# Patient Record
Sex: Female | Born: 1960
Health system: Southern US, Community
[De-identification: ages and names within clinical notes are randomized; demographics above are authoritative.]

## PROBLEM LIST (undated history)

## (undated) DIAGNOSIS — E119 Type 2 diabetes mellitus without complications: Secondary | ICD-10-CM

## (undated) DIAGNOSIS — C801 Malignant (primary) neoplasm, unspecified: Secondary | ICD-10-CM

## (undated) DIAGNOSIS — M797 Fibromyalgia: Secondary | ICD-10-CM

## (undated) DIAGNOSIS — K760 Fatty (change of) liver, not elsewhere classified: Secondary | ICD-10-CM

## (undated) DIAGNOSIS — E785 Hyperlipidemia, unspecified: Secondary | ICD-10-CM

## (undated) DIAGNOSIS — F32A Depression, unspecified: Secondary | ICD-10-CM

## (undated) DIAGNOSIS — I10 Essential (primary) hypertension: Secondary | ICD-10-CM

## (undated) DIAGNOSIS — F329 Major depressive disorder, single episode, unspecified: Secondary | ICD-10-CM

## (undated) HISTORY — DX: Fatty (change of) liver, not elsewhere classified: K76.0

## (undated) HISTORY — DX: Essential (primary) hypertension: I10

## (undated) HISTORY — PX: ABDOMINAL HYSTERECTOMY: SHX81

## (undated) HISTORY — DX: Hyperlipidemia, unspecified: E78.5

## (undated) HISTORY — DX: Major depressive disorder, single episode, unspecified: F32.9

## (undated) HISTORY — DX: Type 2 diabetes mellitus without complications: E11.9

## (undated) HISTORY — PX: RADICAL ABDOMINAL HYSTERECTOMY: SUR659

## (undated) HISTORY — DX: Malignant (primary) neoplasm, unspecified: C80.1

## (undated) HISTORY — DX: Fibromyalgia: M79.7

## (undated) HISTORY — DX: Depression, unspecified: F32.A

---

## 2004-06-23 DIAGNOSIS — C801 Malignant (primary) neoplasm, unspecified: Secondary | ICD-10-CM

## 2004-06-23 HISTORY — DX: Malignant (primary) neoplasm, unspecified: C80.1

## 2014-06-02 DIAGNOSIS — R809 Proteinuria, unspecified: Secondary | ICD-10-CM

## 2014-06-02 DIAGNOSIS — E119 Type 2 diabetes mellitus without complications: Secondary | ICD-10-CM | POA: Insufficient documentation

## 2014-06-02 DIAGNOSIS — E1169 Type 2 diabetes mellitus with other specified complication: Secondary | ICD-10-CM | POA: Insufficient documentation

## 2014-06-02 DIAGNOSIS — F329 Major depressive disorder, single episode, unspecified: Secondary | ICD-10-CM | POA: Insufficient documentation

## 2014-06-02 DIAGNOSIS — E785 Hyperlipidemia, unspecified: Secondary | ICD-10-CM

## 2014-06-02 DIAGNOSIS — E1129 Type 2 diabetes mellitus with other diabetic kidney complication: Secondary | ICD-10-CM

## 2014-06-02 DIAGNOSIS — F32A Depression, unspecified: Secondary | ICD-10-CM | POA: Insufficient documentation

## 2014-06-02 DIAGNOSIS — I10 Essential (primary) hypertension: Secondary | ICD-10-CM | POA: Insufficient documentation

## 2014-09-22 HISTORY — PX: COLONOSCOPY: SHX174

## 2016-11-07 ENCOUNTER — Encounter: Payer: Self-pay | Admitting: Family Medicine

## 2016-11-07 ENCOUNTER — Ambulatory Visit (INDEPENDENT_AMBULATORY_CARE_PROVIDER_SITE_OTHER): Payer: BLUE CROSS/BLUE SHIELD | Admitting: Family Medicine

## 2016-11-07 VITALS — BP 138/85 | HR 83 | Temp 97.6°F | Ht 64.0 in | Wt 164.8 lb

## 2016-11-07 DIAGNOSIS — E1169 Type 2 diabetes mellitus with other specified complication: Secondary | ICD-10-CM | POA: Diagnosis not present

## 2016-11-07 DIAGNOSIS — Z1231 Encounter for screening mammogram for malignant neoplasm of breast: Secondary | ICD-10-CM | POA: Diagnosis not present

## 2016-11-07 DIAGNOSIS — E785 Hyperlipidemia, unspecified: Secondary | ICD-10-CM | POA: Diagnosis not present

## 2016-11-07 DIAGNOSIS — R809 Proteinuria, unspecified: Secondary | ICD-10-CM | POA: Diagnosis not present

## 2016-11-07 DIAGNOSIS — I10 Essential (primary) hypertension: Secondary | ICD-10-CM

## 2016-11-07 DIAGNOSIS — E1129 Type 2 diabetes mellitus with other diabetic kidney complication: Secondary | ICD-10-CM

## 2016-11-07 DIAGNOSIS — M797 Fibromyalgia: Secondary | ICD-10-CM

## 2016-11-07 DIAGNOSIS — Z1239 Encounter for other screening for malignant neoplasm of breast: Secondary | ICD-10-CM

## 2016-11-07 LAB — BAYER DCA HB A1C WAIVED: HB A1C: 6.6 % (ref <7.0)

## 2016-11-07 MED ORDER — MELOXICAM 7.5 MG PO TABS: 7.5000 mg | ORAL_TABLET | Freq: Every day | ORAL | 5 refills | Status: DC

## 2016-11-07 MED ORDER — PROPRANOLOL HCL 20 MG PO TABS: 20.0000 mg | ORAL_TABLET | Freq: Two times a day (BID) | ORAL | 11 refills | Status: DC

## 2016-11-07 NOTE — Progress Notes (Signed)
   HPI  Patient presents today here to establish care.  Pt has Medical Hx of hypertension, type 2 diabetes, fibromyalgia, hyperlipidemia She has passed history of partial hysterectomy for cervical cancer.  Patient needs refill of meloxicam today. Previously taking 7.5 mg twice daily for 5 around with very good improvement. Has been out of it for a few weeks  Diabetes Average fasting 100-130. Average postprandial 90-110 Good medication compliance, no yeast infections with farxiga  PMH: see above Past surgical history: Partial hysterectomy Social history- nonsmoker, no alcohol use, no drug use History: Mother with dementia, depression, heart disease, stroke Father with diabetes, hyperlipidemia, hypertension, stroke  ROS: Per HPI  Objective: BP 138/85   Pulse 83   Temp 97.6 F (36.4 C) (Oral)   Ht '5\' 4"'$  (1.626 m)   Wt 164 lb 12.8 oz (74.8 kg)   BMI 28.29 kg/m  Gen: NAD, alert, cooperative with exam HEENT: NCAT CV: RRR, good S1/S2, no murmur Resp: CTABL, no wheezes, non-labored Abd: SNTND, BS present, no guarding or organomegaly Ext: No edema, warm Neuro: Alert and oriented, No gross deficits  Assessment and plan:  # Type 2 diabetes A1c pending, expect reasonable diabetic control Continue current medications, metformin, Januvia, and farxiga  # HTN Controlled, refill propranolol  # Fibromyalgia Reduce dose of meloxicam to 7.5 mg daily  # HLD Non fasting, LDL ordered No meds for now   Breast cancer screening mammo ordered  Orders Placed This Encounter  Procedures  . MM Digital Screening    Standing Status:   Future    Standing Expiration Date:   01/07/2018    Scheduling Instructions:     [facility]    Order Specific Question:   Reason for Exam (SYMPTOM  OR DIAGNOSIS REQUIRED)    Answer:   screening    Order Specific Question:   Is the patient pregnant?    Answer:   No    Order Specific Question:   Preferred imaging location?    Answer:    External  . LDL Cholesterol, Direct  . CBC with Differential/Platelet  . CMP14+EGFR  . TSH  . Bayer DCA Hb A1c Waived    Meds ordered this encounter  Medications  . DISCONTD: propranolol (INDERAL) 20 MG tablet    Sig: Take 20 mg by mouth 2 (two) times daily.  . sertraline (ZOLOFT) 50 MG tablet    Sig: Take 50 mg by mouth daily.  . Dapagliflozin-Metformin HCl ER 10-998 MG TB24    Sig: Take 2 tablets by mouth every morning.  . sitaGLIPtin (JANUVIA) 100 MG tablet    Sig: Take 1 tablet by mouth daily.  Marland Kitchen DISCONTD: meloxicam (MOBIC) 7.5 MG tablet    Sig: Take 1 tablet by mouth 2 (two) times daily.  . propranolol (INDERAL) 20 MG tablet    Sig: Take 1 tablet (20 mg total) by mouth 2 (two) times daily.    Dispense:  60 tablet    Refill:  11  . meloxicam (MOBIC) 7.5 MG tablet    Sig: Take 1 tablet (7.5 mg total) by mouth daily.    Dispense:  30 tablet    Refill:  [place], MD [place] 11/07/2016, 1:48 PM

## 2016-11-07 NOTE — Patient Instructions (Signed)
Great to meet you!  Come back in 3 months unless you need [place] sooner.   Consider scheduling your mammogram  We will send your labs on mychart or call within 1 week

## 2016-11-08 LAB — CMP14+EGFR
ALBUMIN: 4.3 g/dL (ref 3.5–5.5)
ALT: 38 IU/L — AB (ref 0–32)
AST: 21 IU/L (ref 0–40)
Albumin/Globulin Ratio: 1.9 (ref 1.2–2.2)
Alkaline Phosphatase: 95 IU/L (ref 39–117)
BUN / CREAT RATIO: 18 (ref 9–23)
BUN: 12 mg/dL (ref 6–24)
CALCIUM: 9.4 mg/dL (ref 8.7–10.2)
CHLORIDE: 99 mmol/L (ref 96–106)
CO2: 22 mmol/L (ref 18–29)
Creatinine, Ser: 0.66 mg/dL (ref 0.57–1.00)
GFR calc Af Amer: 116 mL/min/{1.73_m2} (ref >59)
GFR, EST NON AFRICAN AMERICAN: 100 mL/min/{1.73_m2} (ref >59)
GLUCOSE: 116 mg/dL — AB (ref 65–99)
Globulin, Total: 2.3 g/dL (ref 1.5–4.5)
Potassium: 4.4 mmol/L (ref 3.5–5.2)
Sodium: 142 mmol/L (ref 134–144)
TOTAL PROTEIN: 6.6 g/dL (ref 6.0–8.5)

## 2016-11-08 LAB — CBC WITH DIFFERENTIAL/PLATELET
BASOS: 1 %
Basophils Absolute: 0.1 10*3/uL (ref 0.0–0.2)
EOS (ABSOLUTE): 0.1 10*3/uL (ref 0.0–0.4)
Eos: 1 %
HEMOGLOBIN: 13.9 g/dL (ref 11.1–15.9)
Hematocrit: 41 % (ref 34.0–46.6)
IMMATURE GRANS (ABS): 0.1 10*3/uL (ref 0.0–0.1)
IMMATURE GRANULOCYTES: 1 %
Lymphocytes Absolute: 1.7 10*3/uL (ref 0.7–3.1)
Lymphs: 16 %
MCH: 29.7 pg (ref 26.6–33.0)
MCHC: 33.9 g/dL (ref 31.5–35.7)
MCV: 88 fL (ref 79–97)
MONOCYTES: 6 %
Monocytes Absolute: 0.7 10*3/uL (ref 0.1–0.9)
NEUTROS PCT: 75 %
Neutrophils Absolute: 8.2 10*3/uL — ABNORMAL HIGH (ref 1.4–7.0)
Platelets: 287 10*3/uL (ref 150–379)
RBC: 4.68 x10E6/uL (ref 3.77–5.28)
RDW: 13.7 % (ref 12.3–15.4)
WBC: 10.8 10*3/uL (ref 3.4–10.8)

## 2016-11-08 LAB — TSH: TSH: 1.46 u[IU]/mL (ref 0.450–4.500)

## 2016-11-08 LAB — LDL CHOLESTEROL, DIRECT: LDL Direct: 122 mg/dL — ABNORMAL HIGH (ref 0–99)

## 2016-11-19 ENCOUNTER — Telehealth: Payer: Self-pay | Admitting: Family Medicine

## 2016-12-04 ENCOUNTER — Other Ambulatory Visit: Payer: Self-pay

## 2016-12-04 MED ORDER — SERTRALINE HCL 50 MG PO TABS: 50.0000 mg | ORAL_TABLET | Freq: Every day | ORAL | 3 refills | Status: DC

## 2016-12-04 NOTE — Telephone Encounter (Signed)
Has never been filled here.

## 2016-12-12 ENCOUNTER — Telehealth: Payer: Self-pay | Admitting: Family Medicine

## 2016-12-15 ENCOUNTER — Other Ambulatory Visit: Payer: Self-pay | Admitting: Family Medicine

## 2016-12-15 MED ORDER — DAPAGLIFLOZIN PRO-METFORMIN ER 5-1000 MG PO TB24: 2.0000 | ORAL_TABLET | Freq: Every morning | ORAL | 2 refills | Status: DC

## 2016-12-15 MED ORDER — SITAGLIPTIN PHOSPHATE 100 MG PO TABS: 100.0000 mg | ORAL_TABLET | Freq: Every day | ORAL | 2 refills | Status: DC

## 2016-12-15 NOTE — Telephone Encounter (Signed)
Refill sent.

## 2016-12-31 ENCOUNTER — Encounter: Payer: Self-pay | Admitting: Family Medicine

## 2017-02-07 ENCOUNTER — Other Ambulatory Visit: Payer: Self-pay | Admitting: Family Medicine

## 2017-04-07 ENCOUNTER — Other Ambulatory Visit: Payer: Self-pay | Admitting: Family Medicine

## 2017-04-24 ENCOUNTER — Ambulatory Visit (INDEPENDENT_AMBULATORY_CARE_PROVIDER_SITE_OTHER): Payer: BLUE CROSS/BLUE SHIELD | Admitting: Family Medicine

## 2017-04-24 ENCOUNTER — Encounter: Payer: Self-pay | Admitting: Family Medicine

## 2017-04-24 VITALS — BP 139/81 | HR 79 | Temp 97.6°F | Ht 64.0 in | Wt 166.8 lb

## 2017-04-24 DIAGNOSIS — F419 Anxiety disorder, unspecified: Secondary | ICD-10-CM

## 2017-04-24 DIAGNOSIS — R400 Somnolence: Secondary | ICD-10-CM

## 2017-04-24 DIAGNOSIS — E1129 Type 2 diabetes mellitus with other diabetic kidney complication: Secondary | ICD-10-CM

## 2017-04-24 DIAGNOSIS — R809 Proteinuria, unspecified: Secondary | ICD-10-CM | POA: Diagnosis not present

## 2017-04-24 LAB — BAYER DCA HB A1C WAIVED: HB A1C: 8.7 % — AB (ref <7.0)

## 2017-04-24 MED ORDER — QUETIAPINE FUMARATE 50 MG PO TABS: 50.0000 mg | ORAL_TABLET | Freq: Every day | ORAL | 3 refills | Status: DC

## 2017-04-24 NOTE — Progress Notes (Signed)
   HPI  Patient presents today here for follow-up of chronic medical conditions as well as daytime sleepiness  She states this problems been going on for about 5 years.  She reports difficulty sleeping at night with multiple awakenings and racing thoughts. She has been on Zoloft for more than 5 years for depression with good benefit.  She has a sister with bipolar disorder, however denies any other symptoms of bipolar disorder except for racing thoughts.  She does snore very loudly.  She has multiple episodes of falling asleep very lightly during the day with quickly waking up.  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   PMH: Smoking status noted ROS: Per HPI  Objective: BP 139/81   Pulse 79   Temp 97.6 F (36.4 C) (Oral)   Ht 5\' 4"  (1.626 m)   Wt 166 lb 12.8 oz (75.7 kg)   BMI 28.63 kg/m  Gen: NAD, alert, cooperative with exam HEENT: NCAT CV: RRR, good S1/S2, no murmur Resp: CTABL, no wheezes, non-labored Ext: No edema, warm Neuro: Alert and oriented, No gross deficits  Depression screen [facility] 2/9 04/24/2017 11/07/2016  Decreased Interest 2 0  Down, Depressed, Hopeless 0 0  PHQ - 2 Score 2 0  Altered sleeping 3 -  Tired, decreased energy 3 -  Change in appetite 2 -  Feeling bad or failure about yourself  0 -  Trouble concentrating 0 -  Moving slowly or fidgety/restless 0 -  Suicidal thoughts 0 -  PHQ-9 Score 10 -     Assessment and plan:  #Daytime sleepiness, anxiety Likely multifactorial daytime sleepiness, anxiety possibly contributing  Patient certainly does have some racing thoughts at night that are contributing to her difficulty sleeping, have started Seroquel for this.  With loud snoring and multiple episodes of falling asleep during the daytime I am also concerned about obstructive sleep apnea or other sleep disturbance disorder.  I have referred her to neurology to discuss this  more thoroughly.   # type 2 diabetes Good medication compliance, clinically stable Fasting blood sugar slightly uncontrolled A1c pending.      Orders Placed This Encounter  Procedures  . Bayer DCA Hb A1c Waived  . Ambulatory referral to Neurology    Referral Priority:   Routine    Referral Type:   Consultation    Referral Reason:   Specialty Services Required    Requested Specialty:   Neurology    Number of Visits Requested:   1    Meds ordered this encounter  Medications  . QUEtiapine (SEROQUEL) 50 MG tablet    Sig: Take 1 tablet (50 mg total) by mouth at bedtime.    Dispense:  30 tablet    Refill:  [place], MD [place] 04/24/2017, 10:06 AM

## 2017-04-24 NOTE — Patient Instructions (Addendum)
Great to see you!  Start seroquel at night 1 pill once daily.   Come back in about 1 month

## 2017-04-29 DIAGNOSIS — Z1231 Encounter for screening mammogram for malignant neoplasm of breast: Secondary | ICD-10-CM | POA: Diagnosis not present

## 2017-05-14 ENCOUNTER — Other Ambulatory Visit: Payer: Self-pay | Admitting: Family Medicine

## 2017-05-28 ENCOUNTER — Other Ambulatory Visit: Payer: Self-pay | Admitting: Family Medicine

## 2017-05-29 DIAGNOSIS — M65332 Trigger finger, left middle finger: Secondary | ICD-10-CM | POA: Diagnosis not present

## 2017-05-29 NOTE — Telephone Encounter (Signed)
Will you please look at rx. It states to take 2 daily but for quanity of 30.

## 2017-07-23 ENCOUNTER — Other Ambulatory Visit: Payer: Self-pay | Admitting: Family Medicine

## 2017-07-30 DIAGNOSIS — J029 Acute pharyngitis, unspecified: Secondary | ICD-10-CM | POA: Diagnosis not present

## 2017-07-30 DIAGNOSIS — J0100 Acute maxillary sinusitis, unspecified: Secondary | ICD-10-CM | POA: Diagnosis not present

## 2017-08-23 ENCOUNTER — Other Ambulatory Visit: Payer: Self-pay | Admitting: Family Medicine

## 2017-09-07 ENCOUNTER — Other Ambulatory Visit: Payer: Self-pay | Admitting: Family Medicine

## 2017-09-25 ENCOUNTER — Encounter: Payer: Self-pay | Admitting: Family Medicine

## 2017-09-25 ENCOUNTER — Ambulatory Visit: Payer: BLUE CROSS/BLUE SHIELD | Admitting: Family Medicine

## 2017-09-25 VITALS — BP 135/76 | HR 74 | Temp 97.7°F | Ht 64.0 in | Wt 164.8 lb

## 2017-09-25 DIAGNOSIS — E785 Hyperlipidemia, unspecified: Secondary | ICD-10-CM

## 2017-09-25 DIAGNOSIS — F419 Anxiety disorder, unspecified: Secondary | ICD-10-CM

## 2017-09-25 DIAGNOSIS — E1129 Type 2 diabetes mellitus with other diabetic kidney complication: Secondary | ICD-10-CM | POA: Diagnosis not present

## 2017-09-25 DIAGNOSIS — I10 Essential (primary) hypertension: Secondary | ICD-10-CM

## 2017-09-25 DIAGNOSIS — E1169 Type 2 diabetes mellitus with other specified complication: Secondary | ICD-10-CM | POA: Diagnosis not present

## 2017-09-25 DIAGNOSIS — R809 Proteinuria, unspecified: Secondary | ICD-10-CM | POA: Diagnosis not present

## 2017-09-25 LAB — BAYER DCA HB A1C WAIVED: HB A1C (BAYER DCA - WAIVED): 8.1 % — ABNORMAL HIGH (ref <7.0)

## 2017-09-25 NOTE — Progress Notes (Signed)
   HPI  Patient presents today here for follow-up chronic medical conditions.  Seroquel is helping sleep considerably, she is very satisfied.  Hypertension Good medication compliance with Inderal No chest pain.  Diabetes Not checking blood sugars consistently, fasting blood sugar is 182 today, she is fasting currently.  She is tolerating Januvia and xigduo well She is watching her diet.  PMH: Smoking status noted ROS: Per HPI  Objective: BP 135/76   Pulse 74   Temp 97.7 F (36.5 C) (Oral)   Ht '5\' 4"'$  (1.626 m)   Wt 164 lb 12.8 oz (74.8 kg)   BMI 28.29 kg/m  Gen: NAD, alert, cooperative with exam HEENT: NCAT CV: RRR, good S1/S2, no murmur Resp: CTABL, no wheezes, non-labored Ext: No edema, warm Neuro: Alert and oriented, No gross deficits  Diabetic Foot Exam - Simple   Simple Foot Form Diabetic Foot exam was performed with the following findings:  Yes 09/25/2017 10:06 AM  Visual Inspection No deformities, no ulcerations, no other skin breakdown bilaterally:  Yes Sensation Testing Intact to touch and monofilament testing bilaterally:  Yes Pulse Check Posterior Tibialis and Dorsalis pulse intact bilaterally:  Yes Comments      Assessment and plan:  #Type 2 diabetes I expect good control, patient feels that previous slip was due to dietary indiscretion which she has corrected. Continue current regimen, change Januvia to Trulicity if still uncontrolled   #Anxiety Doing well with Zoloft plus Seroquel, no changes   #Hypertension Patient only treated with propranolol, no changes Well-controlled  Hyperlipidemia Repeat labs, fasting   Orders Placed This Encounter  Procedures  . Bayer DCA Hb A1c Waived  . Lipid panel  . CBC with Differential/Platelet  . CMP14+EGFR  . TSH   Laroy Apple, MD [facility] 09/25/2017, 10:07 AM

## 2017-09-25 NOTE — Patient Instructions (Signed)
Great to see you!  Come back in 3 months to see [facility]

## 2017-09-26 LAB — CBC WITH DIFFERENTIAL/PLATELET
BASOS: 0 %
Basophils Absolute: 0 10*3/uL (ref 0.0–0.2)
EOS (ABSOLUTE): 0.1 10*3/uL (ref 0.0–0.4)
Eos: 1 %
Hematocrit: 43.2 % (ref 34.0–46.6)
Hemoglobin: 14 g/dL (ref 11.1–15.9)
IMMATURE GRANS (ABS): 0.1 10*3/uL (ref 0.0–0.1)
IMMATURE GRANULOCYTES: 1 %
LYMPHS: 20 %
Lymphocytes Absolute: 1.6 10*3/uL (ref 0.7–3.1)
MCH: 29.4 pg (ref 26.6–33.0)
MCHC: 32.4 g/dL (ref 31.5–35.7)
MCV: 91 fL (ref 79–97)
Monocytes Absolute: 0.6 10*3/uL (ref 0.1–0.9)
Monocytes: 8 %
NEUTROS PCT: 70 %
Neutrophils Absolute: 5.6 10*3/uL (ref 1.4–7.0)
PLATELETS: 320 10*3/uL (ref 150–379)
RBC: 4.76 x10E6/uL (ref 3.77–5.28)
RDW: 13.3 % (ref 12.3–15.4)
WBC: 8.1 10*3/uL (ref 3.4–10.8)

## 2017-09-26 LAB — LIPID PANEL
CHOLESTEROL TOTAL: 222 mg/dL — AB (ref 100–199)
Chol/HDL Ratio: 5.6 ratio — ABNORMAL HIGH (ref 0.0–4.4)
HDL: 40 mg/dL (ref >39)
LDL CALC: 124 mg/dL — AB (ref 0–99)
Triglycerides: 290 mg/dL — ABNORMAL HIGH (ref 0–149)
VLDL Cholesterol Cal: 58 mg/dL — ABNORMAL HIGH (ref 5–40)

## 2017-09-26 LAB — CMP14+EGFR
A/G RATIO: 2 (ref 1.2–2.2)
ALT: 70 IU/L — AB (ref 0–32)
AST: 36 IU/L (ref 0–40)
Albumin: 4.6 g/dL (ref 3.5–5.5)
Alkaline Phosphatase: 89 IU/L (ref 39–117)
BUN/Creatinine Ratio: 18 (ref 9–23)
BUN: 12 mg/dL (ref 6–24)
Bilirubin Total: 0.3 mg/dL (ref 0.0–1.2)
CALCIUM: 9.9 mg/dL (ref 8.7–10.2)
CO2: 23 mmol/L (ref 20–29)
CREATININE: 0.65 mg/dL (ref 0.57–1.00)
Chloride: 101 mmol/L (ref 96–106)
GFR, EST AFRICAN AMERICAN: 116 mL/min/{1.73_m2} (ref >59)
GFR, EST NON AFRICAN AMERICAN: 100 mL/min/{1.73_m2} (ref >59)
GLUCOSE: 175 mg/dL — AB (ref 65–99)
Globulin, Total: 2.3 g/dL (ref 1.5–4.5)
Potassium: 4.9 mmol/L (ref 3.5–5.2)
Sodium: 142 mmol/L (ref 134–144)
TOTAL PROTEIN: 6.9 g/dL (ref 6.0–8.5)

## 2017-09-26 LAB — TSH: TSH: 1.41 u[IU]/mL (ref 0.450–4.500)

## 2017-09-29 ENCOUNTER — Encounter: Payer: Self-pay | Admitting: *Deleted

## 2017-09-30 ENCOUNTER — Telehealth: Payer: Self-pay | Admitting: Physician Assistant

## 2017-09-30 NOTE — Telephone Encounter (Signed)
Patient aware of results.

## 2017-10-01 ENCOUNTER — Other Ambulatory Visit: Payer: Self-pay | Admitting: Family Medicine

## 2017-10-01 MED ORDER — DULAGLUTIDE 0.75 MG/0.5ML ~~LOC~~ SOAJ: 0.7500 mg | SUBCUTANEOUS | 1 refills | Status: DC

## 2017-10-01 MED ORDER — ATORVASTATIN CALCIUM 10 MG PO TABS: 10.0000 mg | ORAL_TABLET | Freq: Every day | ORAL | 3 refills | Status: DC

## 2017-11-10 ENCOUNTER — Telehealth: Payer: Self-pay | Admitting: Physician Assistant

## 2017-11-20 ENCOUNTER — Other Ambulatory Visit: Payer: Self-pay | Admitting: Family Medicine

## 2017-11-22 ENCOUNTER — Other Ambulatory Visit: Payer: Self-pay | Admitting: Family Medicine

## 2017-11-24 DIAGNOSIS — M545 Low back pain: Secondary | ICD-10-CM | POA: Diagnosis not present

## 2017-11-24 DIAGNOSIS — M25529 Pain in unspecified elbow: Secondary | ICD-10-CM | POA: Diagnosis not present

## 2017-12-10 ENCOUNTER — Other Ambulatory Visit: Payer: Self-pay | Admitting: Family Medicine

## 2017-12-14 DIAGNOSIS — M659 Synovitis and tenosynovitis, unspecified: Secondary | ICD-10-CM | POA: Diagnosis not present

## 2017-12-14 DIAGNOSIS — M79644 Pain in right finger(s): Secondary | ICD-10-CM | POA: Diagnosis not present

## 2017-12-16 ENCOUNTER — Other Ambulatory Visit: Payer: Self-pay | Admitting: Family Medicine

## 2017-12-18 ENCOUNTER — Other Ambulatory Visit: Payer: Self-pay | Admitting: Family Medicine

## 2017-12-28 ENCOUNTER — Ambulatory Visit: Payer: BLUE CROSS/BLUE SHIELD | Admitting: Physician Assistant

## 2017-12-28 ENCOUNTER — Encounter: Payer: Self-pay | Admitting: Physician Assistant

## 2017-12-28 VITALS — BP 124/79 | HR 87 | Temp 98.3°F | Ht 64.0 in | Wt 162.6 lb

## 2017-12-28 DIAGNOSIS — E1129 Type 2 diabetes mellitus with other diabetic kidney complication: Secondary | ICD-10-CM

## 2017-12-28 DIAGNOSIS — R809 Proteinuria, unspecified: Secondary | ICD-10-CM

## 2017-12-28 DIAGNOSIS — M79644 Pain in right finger(s): Secondary | ICD-10-CM | POA: Diagnosis not present

## 2017-12-28 DIAGNOSIS — I10 Essential (primary) hypertension: Secondary | ICD-10-CM

## 2017-12-28 DIAGNOSIS — M2550 Pain in unspecified joint: Secondary | ICD-10-CM | POA: Diagnosis not present

## 2017-12-28 LAB — BAYER DCA HB A1C WAIVED: HB A1C (BAYER DCA - WAIVED): 7.8 % — ABNORMAL HIGH (ref <7.0)

## 2017-12-28 MED ORDER — MELOXICAM 7.5 MG PO TABS: 7.5000 mg | ORAL_TABLET | Freq: Every day | ORAL | 3 refills | Status: DC

## 2017-12-28 MED ORDER — SERTRALINE HCL 50 MG PO TABS: 50.0000 mg | ORAL_TABLET | Freq: Every day | ORAL | 3 refills | Status: DC

## 2017-12-28 MED ORDER — DULAGLUTIDE 0.75 MG/0.5ML ~~LOC~~ SOAJ: SUBCUTANEOUS | 3 refills | Status: DC

## 2017-12-28 MED ORDER — PROPRANOLOL HCL 20 MG PO TABS: 20.0000 mg | ORAL_TABLET | Freq: Two times a day (BID) | ORAL | 3 refills | Status: DC

## 2017-12-28 MED ORDER — DAPAGLIFLOZIN PRO-METFORMIN ER 5-1000 MG PO TB24: 2.0000 | ORAL_TABLET | ORAL | 3 refills | Status: DC

## 2017-12-29 ENCOUNTER — Telehealth: Payer: Self-pay | Admitting: Physician Assistant

## 2017-12-29 LAB — CMP14+EGFR
A/G RATIO: 1.7 (ref 1.2–2.2)
ALK PHOS: 89 IU/L (ref 39–117)
ALT: 50 IU/L — AB (ref 0–32)
AST: 23 IU/L (ref 0–40)
Albumin: 4.2 g/dL (ref 3.5–5.5)
BILIRUBIN TOTAL: 0.2 mg/dL (ref 0.0–1.2)
BUN/Creatinine Ratio: 16 (ref 9–23)
BUN: 10 mg/dL (ref 6–24)
CALCIUM: 9.6 mg/dL (ref 8.7–10.2)
CHLORIDE: 103 mmol/L (ref 96–106)
CO2: 22 mmol/L (ref 20–29)
Creatinine, Ser: 0.61 mg/dL (ref 0.57–1.00)
GFR calc Af Amer: 118 mL/min/{1.73_m2} (ref >59)
GFR calc non Af Amer: 102 mL/min/{1.73_m2} (ref >59)
GLOBULIN, TOTAL: 2.5 g/dL (ref 1.5–4.5)
Glucose: 173 mg/dL — ABNORMAL HIGH (ref 65–99)
POTASSIUM: 4.5 mmol/L (ref 3.5–5.2)
SODIUM: 144 mmol/L (ref 134–144)
Total Protein: 6.7 g/dL (ref 6.0–8.5)

## 2017-12-29 LAB — CBC WITH DIFFERENTIAL/PLATELET
BASOS ABS: 0 10*3/uL (ref 0.0–0.2)
BASOS: 0 %
EOS (ABSOLUTE): 0.1 10*3/uL (ref 0.0–0.4)
Eos: 1 %
Hematocrit: 39.6 % (ref 34.0–46.6)
Hemoglobin: 13.7 g/dL (ref 11.1–15.9)
Immature Grans (Abs): 0.1 10*3/uL (ref 0.0–0.1)
Immature Granulocytes: 1 %
LYMPHS: 26 %
Lymphocytes Absolute: 2.7 10*3/uL (ref 0.7–3.1)
MCH: 30.5 pg (ref 26.6–33.0)
MCHC: 34.6 g/dL (ref 31.5–35.7)
MCV: 88 fL (ref 79–97)
MONOS ABS: 0.7 10*3/uL (ref 0.1–0.9)
Monocytes: 7 %
Neutrophils Absolute: 6.8 10*3/uL (ref 1.4–7.0)
Neutrophils: 65 %
PLATELETS: 275 10*3/uL (ref 150–450)
RBC: 4.49 x10E6/uL (ref 3.77–5.28)
RDW: 13.9 % (ref 12.3–15.4)
WBC: 10.5 10*3/uL (ref 3.4–10.8)

## 2017-12-29 NOTE — Progress Notes (Signed)
BP 124/79   Pulse 87   Temp 98.3 F (36.8 C) (Oral)   Ht 5' 4"  (1.626 m)   Wt 162 lb 9.6 oz (73.8 kg)   BMI 27.91 kg/m    Subjective:    Patient ID: Peggy Hall, female    DOB: Dec 29, 1960, 57 y.o.   MRN: [id number]  HPI: Peggy Hall is a 57 y.o. female presenting on 12/28/2017 for Diabetes (3 month follow up); Hypertension; and Hyperlipidemia  This patient comes in for 41-monthfollow-up on her chronic medical conditions.  They do include type 2 diabetes, proteinuria, hyperlipidemia, depression, hypertension, fibromyalgia.  She will be seeing a rheumatologist in the next couple of weeks.  She does have significant joint pain that moves from joint to joint.  There is associated swelling and redness at times.  She has had shoulder surgery in the past but does not very well with her shoulders at this point.  She currently is dealing with a trigger finger that is quite bothersome because it is her index finger on right hand.  She does need refills on her medication.  She states that she does not know that her A1c is going to be very improved because she has had some dietary indiscretions.  Past Medical History:  Diagnosis Date  . Cancer ([facility] 2006   cervical  . Depression   . Diabetes mellitus without complication ([place]   . Fibromyalgia   . Hypertension    Relevant past medical, surgical, family and social history reviewed and updated as indicated. Interim medical history since our last visit reviewed. Allergies and medications reviewed and updated. DATA REVIEWED: CHART IN EPIC  Family History reviewed for pertinent findings.  Review of Systems  Constitutional: Negative.   HENT: Negative.   Eyes: Negative.   Respiratory: Negative.   Gastrointestinal: Negative.   Genitourinary: Negative.   Musculoskeletal: Positive for arthralgias, joint swelling and myalgias.    Allergies as of 12/28/2017   No Known Allergies     Medication List        Accurate as of 12/28/17 11:59 PM.  Always use your most recent med list.          atorvastatin 10 MG tablet Commonly known as:  LIPITOR Take 1 tablet (10 mg total) by mouth daily.   Dapagliflozin-metFORMIN HCl ER 10-998 MG Tb24 Commonly known as:  XIGDUO XR Take 2 tablets by mouth every morning.   Dulaglutide 0.75 MG/0.5ML Sopn Commonly known as:  TRULICITY INJECT 09.56LOSUBCUTANEOUSLY ONCE A WEEK   meloxicam 7.5 MG tablet Commonly known as:  MOBIC Take 1 tablet (7.5 mg total) by mouth daily.   propranolol 20 MG tablet Commonly known as:  INDERAL Take 1 tablet (20 mg total) by mouth 2 (two) times daily.   QUEtiapine 50 MG tablet Commonly known as:  SEROQUEL TAKE 1 TABLET BY MOUTH AT BEDTIME   sertraline 50 MG tablet Commonly known as:  ZOLOFT Take 1 tablet (50 mg total) by mouth daily.          Objective:    BP 124/79   Pulse 87   Temp 98.3 F (36.8 C) (Oral)   Ht 5' 4"  (1.626 m)   Wt 162 lb 9.6 oz (73.8 kg)   BMI 27.91 kg/m   No Known Allergies  Wt Readings from Last 3 Encounters:  12/28/17 162 lb 9.6 oz (73.8 kg)  09/25/17 164 lb 12.8 oz (74.8 kg)  04/24/17 166 lb 12.8 oz (75.7 kg)  Physical Exam  Constitutional: She is oriented to person, place, and time. She appears well-developed and well-nourished.  HENT:  Head: Normocephalic and atraumatic.  Eyes: Pupils are equal, round, and reactive to light. Conjunctivae and EOM are normal.  Cardiovascular: Normal rate, regular rhythm, normal heart sounds and intact distal pulses.  Pulmonary/Chest: Effort normal and breath sounds normal.  Abdominal: Soft. Bowel sounds are normal.  Neurological: She is alert and oriented to person, place, and time. She has normal reflexes.  Skin: Skin is warm and dry. No rash noted.  Psychiatric: She has a normal mood and affect. Her behavior is normal. Judgment and thought content normal.    Results for orders placed or performed in visit on 12/28/17  CMP14+EGFR  Result Value Ref Range   Glucose 173 (H)  65 - 99 mg/dL   BUN 10 6 - 24 mg/dL   Creatinine, Ser 0.61 0.57 - 1.00 mg/dL   GFR calc non Af Amer 102 >59 mL/min/1.73   GFR calc Af Amer 118 >59 mL/min/1.73   BUN/Creatinine Ratio 16 9 - 23   Sodium 144 134 - 144 mmol/L   Potassium 4.5 3.5 - 5.2 mmol/L   Chloride 103 96 - 106 mmol/L   CO2 22 20 - 29 mmol/L   Calcium 9.6 8.7 - 10.2 mg/dL   Total Protein 6.7 6.0 - 8.5 g/dL   Albumin 4.2 3.5 - 5.5 g/dL   Globulin, Total 2.5 1.5 - 4.5 g/dL   Albumin/Globulin Ratio 1.7 1.2 - 2.2   Bilirubin Total 0.2 0.0 - 1.2 mg/dL   Alkaline Phosphatase 89 39 - 117 IU/L   AST 23 0 - 40 IU/L   ALT 50 (H) 0 - 32 IU/L  CBC with Differential/Platelet  Result Value Ref Range   WBC 10.5 3.4 - 10.8 x10E3/uL   RBC 4.49 3.77 - 5.28 x10E6/uL   Hemoglobin 13.7 11.1 - 15.9 g/dL   Hematocrit 39.6 34.0 - 46.6 %   MCV 88 79 - 97 fL   MCH 30.5 26.6 - 33.0 pg   MCHC 34.6 31.5 - 35.7 g/dL   RDW 13.9 12.3 - 15.4 %   Platelets 275 150 - 450 x10E3/uL   Neutrophils 65 Not Estab. %   Lymphs 26 Not Estab. %   Monocytes 7 Not Estab. %   Eos 1 Not Estab. %   Basos 0 Not Estab. %   Neutrophils Absolute 6.8 1.4 - 7.0 x10E3/uL   Lymphocytes Absolute 2.7 0.7 - 3.1 x10E3/uL   Monocytes Absolute 0.7 0.1 - 0.9 x10E3/uL   EOS (ABSOLUTE) 0.1 0.0 - 0.4 x10E3/uL   Basophils Absolute 0.0 0.0 - 0.2 x10E3/uL   Immature Granulocytes 1 Not Estab. %   Immature Grans (Abs) 0.1 0.0 - 0.1 x10E3/uL  Bayer DCA Hb A1c Waived  Result Value Ref Range   HB A1C (BAYER DCA - WAIVED) 7.8 (H) <7.0 %      Assessment & Plan:   1. Type 2 diabetes mellitus with microalbuminuria, without long-term current use of insulin (HCC) - CMP14+EGFR - CBC with Differential/Platelet - Bayer DCA Hb A1c Waived  2. Essential hypertension - CMP14+EGFR - CBC with Differential/Platelet - Bayer DCA Hb A1c Waived  3. Arthralgia, unspecified joint - meloxicam (MOBIC) 7.5 MG tablet; Take 1 tablet (7.5 mg total) by mouth daily.  Dispense: 90 tablet; Refill:  3   Continue all other maintenance medications as listed above.  Follow up plan: Recheck 3 months  Educational handout given for [employer]  Adah Salvage PA-C [facility] [address]  [place], [place] [address] [phone]   12/29/2017, 10:16 AM

## 2017-12-30 NOTE — Telephone Encounter (Signed)
Pt is aware and verbalizes understanding

## 2017-12-30 NOTE — Telephone Encounter (Signed)
Lab results are complete

## 2018-01-07 DIAGNOSIS — M797 Fibromyalgia: Secondary | ICD-10-CM | POA: Diagnosis not present

## 2018-01-26 DIAGNOSIS — M79644 Pain in right finger(s): Secondary | ICD-10-CM | POA: Diagnosis not present

## 2018-02-16 DIAGNOSIS — M150 Primary generalized (osteo)arthritis: Secondary | ICD-10-CM | POA: Diagnosis not present

## 2018-02-16 DIAGNOSIS — M797 Fibromyalgia: Secondary | ICD-10-CM | POA: Diagnosis not present

## 2018-02-16 DIAGNOSIS — Z791 Long term (current) use of non-steroidal anti-inflammatories (NSAID): Secondary | ICD-10-CM | POA: Diagnosis not present

## 2018-02-23 DIAGNOSIS — M79644 Pain in right finger(s): Secondary | ICD-10-CM | POA: Diagnosis not present

## 2018-03-19 DIAGNOSIS — M65321 Trigger finger, right index finger: Secondary | ICD-10-CM | POA: Diagnosis not present

## 2018-03-24 ENCOUNTER — Telehealth: Payer: Self-pay | Admitting: Physician Assistant

## 2018-03-29 ENCOUNTER — Telehealth: Payer: Self-pay | Admitting: Physician Assistant

## 2018-03-29 NOTE — Telephone Encounter (Signed)
Rheumatology did not send in RX

## 2018-03-29 NOTE — Telephone Encounter (Signed)
Did the rheumatologist order or do we need to do it?

## 2018-03-29 NOTE — Telephone Encounter (Signed)
Please review and advise.

## 2018-03-29 NOTE — Telephone Encounter (Signed)
rheumatologist changed her meloxicam (MOBIC) 7.5 MG tablet to take 2 instead of 1 please send to [facility]

## 2018-03-30 ENCOUNTER — Other Ambulatory Visit: Payer: Self-pay | Admitting: Physician Assistant

## 2018-03-30 DIAGNOSIS — M2550 Pain in unspecified joint: Secondary | ICD-10-CM

## 2018-03-30 MED ORDER — MELOXICAM 7.5 MG PO TABS: 7.5000 mg | ORAL_TABLET | Freq: Two times a day (BID) | ORAL | 3 refills | Status: DC

## 2018-03-30 NOTE — Telephone Encounter (Signed)
sent 

## 2018-03-30 NOTE — Telephone Encounter (Signed)
Left message- RX was sent tp pharmacy.

## 2018-05-06 DIAGNOSIS — Z6829 Body mass index (BMI) 29.0-29.9, adult: Secondary | ICD-10-CM | POA: Diagnosis not present

## 2018-05-06 DIAGNOSIS — Z01419 Encounter for gynecological examination (general) (routine) without abnormal findings: Secondary | ICD-10-CM | POA: Diagnosis not present

## 2018-05-10 DIAGNOSIS — Z01419 Encounter for gynecological examination (general) (routine) without abnormal findings: Secondary | ICD-10-CM | POA: Diagnosis not present

## 2018-05-14 ENCOUNTER — Encounter: Payer: Self-pay | Admitting: Physician Assistant

## 2018-05-14 ENCOUNTER — Ambulatory Visit: Payer: BLUE CROSS/BLUE SHIELD | Admitting: Physician Assistant

## 2018-05-14 VITALS — BP 133/73 | HR 69 | Temp 97.1°F | Ht 64.0 in | Wt 165.0 lb

## 2018-05-14 DIAGNOSIS — F32A Depression, unspecified: Secondary | ICD-10-CM

## 2018-05-14 DIAGNOSIS — F329 Major depressive disorder, single episode, unspecified: Secondary | ICD-10-CM | POA: Diagnosis not present

## 2018-05-14 DIAGNOSIS — M797 Fibromyalgia: Secondary | ICD-10-CM | POA: Diagnosis not present

## 2018-05-14 DIAGNOSIS — E785 Hyperlipidemia, unspecified: Secondary | ICD-10-CM | POA: Diagnosis not present

## 2018-05-14 DIAGNOSIS — E1169 Type 2 diabetes mellitus with other specified complication: Secondary | ICD-10-CM | POA: Diagnosis not present

## 2018-05-14 DIAGNOSIS — I10 Essential (primary) hypertension: Secondary | ICD-10-CM | POA: Diagnosis not present

## 2018-05-14 DIAGNOSIS — E1129 Type 2 diabetes mellitus with other diabetic kidney complication: Secondary | ICD-10-CM

## 2018-05-14 DIAGNOSIS — R809 Proteinuria, unspecified: Secondary | ICD-10-CM

## 2018-05-14 LAB — BAYER DCA HB A1C WAIVED: HB A1C: 7.9 % — AB (ref <7.0)

## 2018-05-14 MED ORDER — LISINOPRIL 2.5 MG PO TABS: 2.5000 mg | ORAL_TABLET | Freq: Every day | ORAL | 3 refills | Status: DC

## 2018-05-14 MED ORDER — QUETIAPINE FUMARATE 50 MG PO TABS: 50.0000 mg | ORAL_TABLET | Freq: Every day | ORAL | 5 refills | Status: DC

## 2018-05-15 DIAGNOSIS — E785 Hyperlipidemia, unspecified: Secondary | ICD-10-CM | POA: Insufficient documentation

## 2018-05-15 LAB — CMP14+EGFR
ALBUMIN: 4.7 g/dL (ref 3.5–5.5)
ALK PHOS: 102 IU/L (ref 39–117)
ALT: 72 IU/L — ABNORMAL HIGH (ref 0–32)
AST: 36 IU/L (ref 0–40)
Albumin/Globulin Ratio: 2.1 (ref 1.2–2.2)
BILIRUBIN TOTAL: 0.3 mg/dL (ref 0.0–1.2)
BUN / CREAT RATIO: 17 (ref 9–23)
BUN: 11 mg/dL (ref 6–24)
CHLORIDE: 100 mmol/L (ref 96–106)
CO2: 22 mmol/L (ref 20–29)
Calcium: 9.7 mg/dL (ref 8.7–10.2)
Creatinine, Ser: 0.65 mg/dL (ref 0.57–1.00)
GFR calc Af Amer: 115 mL/min/{1.73_m2} (ref >59)
GFR calc non Af Amer: 100 mL/min/{1.73_m2} (ref >59)
GLUCOSE: 96 mg/dL (ref 65–99)
Globulin, Total: 2.2 g/dL (ref 1.5–4.5)
Potassium: 4.4 mmol/L (ref 3.5–5.2)
Sodium: 142 mmol/L (ref 134–144)
Total Protein: 6.9 g/dL (ref 6.0–8.5)

## 2018-05-15 LAB — CBC WITH DIFFERENTIAL/PLATELET
BASOS ABS: 0.1 10*3/uL (ref 0.0–0.2)
Basos: 1 %
EOS (ABSOLUTE): 0.2 10*3/uL (ref 0.0–0.4)
EOS: 2 %
HEMOGLOBIN: 14.1 g/dL (ref 11.1–15.9)
Hematocrit: 40.5 % (ref 34.0–46.6)
IMMATURE GRANS (ABS): 0.1 10*3/uL (ref 0.0–0.1)
Immature Granulocytes: 1 %
LYMPHS: 30 %
Lymphocytes Absolute: 2.7 10*3/uL (ref 0.7–3.1)
MCH: 30.6 pg (ref 26.6–33.0)
MCHC: 34.8 g/dL (ref 31.5–35.7)
MCV: 88 fL (ref 79–97)
MONOCYTES: 8 %
Monocytes Absolute: 0.7 10*3/uL (ref 0.1–0.9)
Neutrophils Absolute: 5.4 10*3/uL (ref 1.4–7.0)
Neutrophils: 58 %
Platelets: 288 10*3/uL (ref 150–450)
RBC: 4.61 x10E6/uL (ref 3.77–5.28)
RDW: 12.4 % (ref 12.3–15.4)
WBC: 9.2 10*3/uL (ref 3.4–10.8)

## 2018-05-15 LAB — LIPID PANEL
CHOLESTEROL TOTAL: 153 mg/dL (ref 100–199)
Chol/HDL Ratio: 3.9 ratio (ref 0.0–4.4)
HDL: 39 mg/dL — ABNORMAL LOW (ref >39)
LDL Calculated: 60 mg/dL (ref 0–99)
Triglycerides: 270 mg/dL — ABNORMAL HIGH (ref 0–149)
VLDL Cholesterol Cal: 54 mg/dL — ABNORMAL HIGH (ref 5–40)

## 2018-05-15 NOTE — Progress Notes (Signed)
BP 133/73   Pulse 69   Temp (!) 97.1 F (36.2 C) (Oral)   Ht 5' 4"  (1.626 m)   Wt 165 lb (74.8 kg)   BMI 28.32 kg/m    Subjective:    Patient ID: Peggy Hall, female    DOB: Aug 31, 1960, 57 y.o.   MRN: [id number]  HPI: Peggy Hall is a 57 y.o. female presenting on 05/14/2018 for Diabetes (3 month ); Hyperlipidemia; and Hypertension  This patient comes in for periodic recheck on medications and conditions including hyperlipidemia, diabetes, depression, She reports that she is doing very well overall. She is due labs while  She is here today.  All medications are reviewed today. There are no reports of any problems with the medications. All of the medical conditions are reviewed and updated.  Lab work is reviewed and will be ordered as medically necessary. There are no new problems reported with today's visit.   Past Medical History:  Diagnosis Date  . Cancer [facility]) 2006   cervical  . Depression   . Diabetes mellitus without complication ([place])   . Fibromyalgia   . Hypertension    Relevant past medical, surgical, family and social history reviewed and updated as indicated. Interim medical history since our last visit reviewed. Allergies and medications reviewed and updated. DATA REVIEWED: CHART IN EPIC  Family History reviewed for pertinent findings.  Review of Systems  Constitutional: Negative.   HENT: Negative.   Eyes: Negative.   Respiratory: Negative.   Gastrointestinal: Negative.   Genitourinary: Negative.     Allergies as of 05/14/2018   No Known Allergies     Medication List        Accurate as of 05/14/18 11:59 PM. Always use your most recent med list.          atorvastatin 10 MG tablet Commonly known as:  LIPITOR Take 1 tablet (10 mg total) by mouth daily.   Dapagliflozin-metFORMIN HCl ER 10-998 MG Tb24 Take 2 tablets by mouth every morning.   Dulaglutide 0.75 MG/0.5ML Sopn INJECT 0.75MG SUBCUTANEOUSLY ONCE A WEEK   lisinopril 2.5 MG  tablet Commonly known as:  PRINIVIL,ZESTRIL Take 1 tablet (2.5 mg total) by mouth daily.   meloxicam 7.5 MG tablet Commonly known as:  MOBIC Take 1 tablet (7.5 mg total) by mouth 2 (two) times daily.   propranolol 20 MG tablet Commonly known as:  INDERAL Take 1 tablet (20 mg total) by mouth 2 (two) times daily.   QUEtiapine 50 MG tablet Commonly known as:  SEROQUEL Take 1 tablet (50 mg total) by mouth at bedtime.   sertraline 50 MG tablet Commonly known as:  ZOLOFT Take 1 tablet (50 mg total) by mouth daily.          Objective:    BP 133/73   Pulse 69   Temp (!) 97.1 F (36.2 C) (Oral)   Ht 5' 4"  (1.626 m)   Wt 165 lb (74.8 kg)   BMI 28.32 kg/m   No Known Allergies  Wt Readings from Last 3 Encounters:  05/14/18 165 lb (74.8 kg)  12/28/17 162 lb 9.6 oz (73.8 kg)  09/25/17 164 lb 12.8 oz (74.8 kg)    Physical Exam  Constitutional: She is oriented to person, place, and time. She appears well-developed and well-nourished.  HENT:  Head: Normocephalic and atraumatic.  Eyes: Pupils are equal, round, and reactive to light. Conjunctivae and EOM are normal.  Cardiovascular: Normal rate, regular rhythm, normal heart sounds and intact distal pulses.  Pulmonary/Chest: Effort normal and breath sounds normal.  Abdominal: Soft. Bowel sounds are normal.  Neurological: She is alert and oriented to person, place, and time. She has normal reflexes.  Skin: Skin is warm and dry. No rash noted.  Psychiatric: She has a normal mood and affect. Her behavior is normal. Judgment and thought content normal.    Results for orders placed or performed in visit on 05/14/18  CBC with Differential/Platelet  Result Value Ref Range   WBC 9.2 3.4 - 10.8 x10E3/uL   RBC 4.61 3.77 - 5.28 x10E6/uL   Hemoglobin 14.1 11.1 - 15.9 g/dL   Hematocrit 40.5 34.0 - 46.6 %   MCV 88 79 - 97 fL   MCH 30.6 26.6 - 33.0 pg   MCHC 34.8 31.5 - 35.7 g/dL   RDW 12.4 12.3 - 15.4 %   Platelets 288 150 - 450  x10E3/uL   Neutrophils 58 Not Estab. %   Lymphs 30 Not Estab. %   Monocytes 8 Not Estab. %   Eos 2 Not Estab. %   Basos 1 Not Estab. %   Neutrophils Absolute 5.4 1.4 - 7.0 x10E3/uL   Lymphocytes Absolute 2.7 0.7 - 3.1 x10E3/uL   Monocytes Absolute 0.7 0.1 - 0.9 x10E3/uL   EOS (ABSOLUTE) 0.2 0.0 - 0.4 x10E3/uL   Basophils Absolute 0.1 0.0 - 0.2 x10E3/uL   Immature Granulocytes 1 Not Estab. %   Immature Grans (Abs) 0.1 0.0 - 0.1 x10E3/uL  CMP14+EGFR  Result Value Ref Range   Glucose 96 65 - 99 mg/dL   BUN 11 6 - 24 mg/dL   Creatinine, Ser 0.65 0.57 - 1.00 mg/dL   GFR calc non Af Amer 100 >59 mL/min/1.73   GFR calc Af Amer 115 >59 mL/min/1.73   BUN/Creatinine Ratio 17 9 - 23   Sodium 142 134 - 144 mmol/L   Potassium 4.4 3.5 - 5.2 mmol/L   Chloride 100 96 - 106 mmol/L   CO2 22 20 - 29 mmol/L   Calcium 9.7 8.7 - 10.2 mg/dL   Total Protein 6.9 6.0 - 8.5 g/dL   Albumin 4.7 3.5 - 5.5 g/dL   Globulin, Total 2.2 1.5 - 4.5 g/dL   Albumin/Globulin Ratio 2.1 1.2 - 2.2   Bilirubin Total 0.3 0.0 - 1.2 mg/dL   Alkaline Phosphatase 102 39 - 117 IU/L   AST 36 0 - 40 IU/L   ALT 72 (H) 0 - 32 IU/L  Lipid panel  Result Value Ref Range   Cholesterol, Total 153 100 - 199 mg/dL   Triglycerides 270 (H) 0 - 149 mg/dL   HDL 39 (L) >39 mg/dL   VLDL Cholesterol Cal 54 (H) 5 - 40 mg/dL   LDL Calculated 60 0 - 99 mg/dL   Chol/HDL Ratio 3.9 0.0 - 4.4 ratio  Bayer DCA Hb A1c Waived  Result Value Ref Range   HB A1C (BAYER DCA - WAIVED) 7.9 (H) <7.0 %      Assessment & Plan:   1. Hyperlipidemia associated with type 2 diabetes mellitus ([place]) - CBC with Differential/Platelet - CMP14+EGFR - Lipid panel - Bayer DCA Hb A1c Waived  2. Essential hypertension - CBC with Differential/Platelet - CMP14+EGFR - Lipid panel - Bayer DCA Hb A1c Waived  3. Fibromyalgia  4. Depression, unspecified depression type - QUEtiapine (SEROQUEL) 50 MG tablet; Take 1 tablet (50 mg total) by mouth at bedtime.   Dispense: 30 tablet; Refill: 5  5. Hyperlipidemia, unspecified hyperlipidemia type - Lipid panel  6.  Type 2 diabetes mellitus with microalbuminuria, without long-term current use of insulin (HCC) - lisinopril (PRINIVIL,ZESTRIL) 2.5 MG tablet; Take 1 tablet (2.5 mg total) by mouth daily.  Dispense: 90 tablet; Refill: 3   Continue all other maintenance medications as listed above.  Follow up plan: Return in about 4 months (around 09/12/2018) for recheck and labs.  Educational handout given for [place] PA-C [facility] [address]  [place], [place] [address] [phone]   05/15/2018, 6:30 PM

## 2018-05-17 ENCOUNTER — Other Ambulatory Visit: Payer: Self-pay | Admitting: Physician Assistant

## 2018-05-17 MED ORDER — DULAGLUTIDE 1.5 MG/0.5ML ~~LOC~~ SOAJ: 1.5000 mg | SUBCUTANEOUS | 5 refills | Status: DC

## 2018-07-03 ENCOUNTER — Ambulatory Visit: Payer: BLUE CROSS/BLUE SHIELD | Admitting: Physician Assistant

## 2018-07-03 ENCOUNTER — Ambulatory Visit: Payer: BLUE CROSS/BLUE SHIELD

## 2018-07-03 VITALS — BP 137/80 | HR 86 | Temp 98.4°F | Ht 64.0 in | Wt 161.1 lb

## 2018-07-03 DIAGNOSIS — J029 Acute pharyngitis, unspecified: Secondary | ICD-10-CM

## 2018-07-03 MED ORDER — AMOXICILLIN 500 MG PO CAPS: 500.0000 mg | ORAL_CAPSULE | Freq: Three times a day (TID) | ORAL | 0 refills | Status: DC

## 2018-07-03 NOTE — Progress Notes (Signed)
BP 137/80   Pulse 86   Temp 98.4 F (36.9 C) (Oral)   Ht 5\' 4"  (1.626 m)   Wt 161 lb 2 oz (73.1 kg)   BMI 27.66 kg/m    Subjective:    Patient ID: Peggy Hall, female    DOB: 12-24-1960, 58 y.o.   MRN: [id number]  HPI: Peggy Hall is a 58 y.o. female presenting on 07/03/2018 for Sore Throat  This patient has had many days of sore throat and postnasal drainage, headache at times and sinus pressure. There is copious drainage at times. Denies any fever at this time. There has been a history of sinus infections in the past.  There is cough at night. It has become more prevalent in recent days.   Past Medical History:  Diagnosis Date  . Cancer [facility]) 2006   cervical  . Depression   . Diabetes mellitus without complication ([place])   . Fibromyalgia   . Hypertension    Relevant past medical, surgical, family and social history reviewed and updated as indicated. Interim medical history since our last visit reviewed. Allergies and medications reviewed and updated. DATA REVIEWED: CHART IN EPIC  Family History reviewed for pertinent findings.  Review of Systems  Constitutional: Positive for chills and fatigue. Negative for activity change, appetite change and fever.  HENT: Positive for congestion, postnasal drip, sinus pain and sore throat.   Eyes: Negative.   Respiratory: Positive for cough. Negative for wheezing.   Cardiovascular: Negative.  Negative for chest pain, palpitations and leg swelling.  Gastrointestinal: Negative.   Genitourinary: Negative.   Musculoskeletal: Negative.   Skin: Negative.   Neurological: Positive for headaches.    Allergies as of 07/03/2018   No Known Allergies     Medication List       Accurate as of July 03, 2018 11:59 PM. Always use your most recent med list.        amoxicillin 500 MG capsule Commonly known as:  AMOXIL Take 1 capsule (500 mg total) by mouth 3 (three) times daily.   atorvastatin 10 MG tablet Commonly known as:   LIPITOR Take 1 tablet (10 mg total) by mouth daily.   Dapagliflozin-metFORMIN HCl ER 10-998 MG Tb24 Commonly known as:  XIGDUO XR Take 2 tablets by mouth every morning.   Dulaglutide 1.5 MG/0.5ML Sopn Commonly known as:  TRULICITY Inject 1.5 mg into the skin once a week.   lisinopril 2.5 MG tablet Commonly known as:  PRINIVIL,ZESTRIL Take 1 tablet (2.5 mg total) by mouth daily.   meloxicam 7.5 MG tablet Commonly known as:  MOBIC Take 1 tablet (7.5 mg total) by mouth 2 (two) times daily.   propranolol 20 MG tablet Commonly known as:  INDERAL Take 1 tablet (20 mg total) by mouth 2 (two) times daily.   QUEtiapine 50 MG tablet Commonly known as:  SEROQUEL Take 1 tablet (50 mg total) by mouth at bedtime.   sertraline 50 MG tablet Commonly known as:  ZOLOFT Take 1 tablet (50 mg total) by mouth daily.          Objective:    BP 137/80   Pulse 86   Temp 98.4 F (36.9 C) (Oral)   Ht 5\' 4"  (1.626 m)   Wt 161 lb 2 oz (73.1 kg)   BMI 27.66 kg/m   No Known Allergies  Wt Readings from Last 3 Encounters:  07/03/18 161 lb 2 oz (73.1 kg)  05/14/18 165 lb (74.8 kg)  12/28/17 162 lb 9.6  oz (73.8 kg)    Physical Exam Vitals signs and nursing note reviewed.  Constitutional:      Appearance: She is well-developed.  HENT:     Head: Normocephalic and atraumatic.     Right Ear: A middle ear effusion is present.     Left Ear: A middle ear effusion is present.     Nose: Mucosal edema present.     Right Sinus: No frontal sinus tenderness.     Left Sinus: No frontal sinus tenderness.     Mouth/Throat:     Pharynx: Posterior oropharyngeal erythema present. No oropharyngeal exudate.     Tonsils: No tonsillar abscesses.  Eyes:     Conjunctiva/sclera: Conjunctivae normal.     Pupils: Pupils are equal, round, and reactive to light.  Neck:     Musculoskeletal: Normal range of motion.  Cardiovascular:     Rate and Rhythm: Normal rate and regular rhythm.     Heart sounds: Normal  heart sounds.  Pulmonary:     Effort: Pulmonary effort is normal.     Breath sounds: Normal breath sounds.  Abdominal:     General: Bowel sounds are normal.     Palpations: Abdomen is soft.  Skin:    General: Skin is warm and dry.     Findings: No rash.  Neurological:     Mental Status: She is alert and oriented to person, place, and time.     Deep Tendon Reflexes: Reflexes are normal and symmetric.  Psychiatric:        Behavior: Behavior normal.        Thought Content: Thought content normal.        Judgment: Judgment normal.     Results for orders placed or performed in visit on 07/03/18  Rapid Strep Screen ([facility] ONLY)  Result Value Ref Range   Strep Gp A Ag, IA W/Reflex Negative Negative  Culture, Group A Strep  Result Value Ref Range   Strep A Culture CANCELED       Assessment & Plan:   1. Sore throat - Rapid Strep Screen ([facility] ONLY) - amoxicillin (AMOXIL) 500 MG capsule; Take 1 capsule (500 mg total) by mouth 3 (three) times daily.  Dispense: 30 capsule; Refill: 0 - Culture, Group A Strep   Continue all other maintenance medications as listed above.  Follow up plan: No follow-ups on file.  Educational handout given for [place] PA-C [place] [address]  [place], [place] [address] [phone]   07/05/2018, 1:49 PM

## 2018-07-05 LAB — RAPID STREP SCREEN (MED CTR MEBANE ONLY): STREP GP A AG, IA W/REFLEX: NEGATIVE

## 2018-07-05 LAB — CULTURE, GROUP A STREP

## 2018-08-10 DIAGNOSIS — R200 Anesthesia of skin: Secondary | ICD-10-CM | POA: Diagnosis not present

## 2018-08-10 DIAGNOSIS — M79601 Pain in right arm: Secondary | ICD-10-CM | POA: Diagnosis not present

## 2018-08-10 DIAGNOSIS — M79641 Pain in right hand: Secondary | ICD-10-CM | POA: Diagnosis not present

## 2018-09-10 ENCOUNTER — Other Ambulatory Visit: Payer: Self-pay

## 2018-09-10 ENCOUNTER — Encounter: Payer: Self-pay | Admitting: Physician Assistant

## 2018-09-10 ENCOUNTER — Ambulatory Visit: Payer: BLUE CROSS/BLUE SHIELD | Admitting: Physician Assistant

## 2018-09-10 VITALS — BP 114/79 | HR 76 | Temp 97.5°F | Ht 64.0 in | Wt 157.0 lb

## 2018-09-10 DIAGNOSIS — E1129 Type 2 diabetes mellitus with other diabetic kidney complication: Secondary | ICD-10-CM

## 2018-09-10 DIAGNOSIS — R809 Proteinuria, unspecified: Secondary | ICD-10-CM

## 2018-09-10 DIAGNOSIS — E785 Hyperlipidemia, unspecified: Secondary | ICD-10-CM | POA: Diagnosis not present

## 2018-09-10 LAB — BAYER DCA HB A1C WAIVED: HB A1C: 6.8 % (ref <7.0)

## 2018-09-10 MED ORDER — DULAGLUTIDE 1.5 MG/0.5ML ~~LOC~~ SOAJ: 1.5000 mg | SUBCUTANEOUS | 3 refills | Status: DC

## 2018-09-11 LAB — CMP14+EGFR
ALBUMIN: 4.3 g/dL (ref 3.8–4.9)
ALT: 47 IU/L — ABNORMAL HIGH (ref 0–32)
AST: 23 IU/L (ref 0–40)
Albumin/Globulin Ratio: 2 (ref 1.2–2.2)
Alkaline Phosphatase: 92 IU/L (ref 39–117)
BUN / CREAT RATIO: 24 — AB (ref 9–23)
BUN: 15 mg/dL (ref 6–24)
Bilirubin Total: 0.3 mg/dL (ref 0.0–1.2)
CALCIUM: 9.6 mg/dL (ref 8.7–10.2)
CO2: 23 mmol/L (ref 20–29)
CREATININE: 0.63 mg/dL (ref 0.57–1.00)
Chloride: 101 mmol/L (ref 96–106)
GFR calc Af Amer: 116 mL/min/{1.73_m2} (ref >59)
GFR, EST NON AFRICAN AMERICAN: 101 mL/min/{1.73_m2} (ref >59)
GLOBULIN, TOTAL: 2.2 g/dL (ref 1.5–4.5)
GLUCOSE: 122 mg/dL — AB (ref 65–99)
Potassium: 4.7 mmol/L (ref 3.5–5.2)
Sodium: 140 mmol/L (ref 134–144)
TOTAL PROTEIN: 6.5 g/dL (ref 6.0–8.5)

## 2018-09-11 LAB — LIPID PANEL
CHOL/HDL RATIO: 3.8 ratio (ref 0.0–4.4)
CHOLESTEROL TOTAL: 125 mg/dL (ref 100–199)
HDL: 33 mg/dL — ABNORMAL LOW (ref >39)
LDL CALC: 65 mg/dL (ref 0–99)
Triglycerides: 135 mg/dL (ref 0–149)
VLDL CHOLESTEROL CAL: 27 mg/dL (ref 5–40)

## 2018-09-12 NOTE — Progress Notes (Signed)
BP 114/79   Pulse 76   Temp (!) 97.5 F (36.4 C) (Oral)   Ht _0  (1.626 m)   Wt 157 lb (71.2 kg)   BMI 26.95 kg/m    Subjective:    Patient ID: Peggy Hall, female    DOB: 1961-03-09, 58 y.o.   MRN: [id number]  HPI: Peggy Hall is a 58 y.o. female presenting on 09/10/2018 for Diabetes (4 month)  This patient comes in for periodic recheck on medications and conditions including diabetes and high cholesterol. She reports tolerating the increase in trulicity.  Her sugar readings are doing very good.  She has even lost a few more pounds. Decreased Appetite and stomach fullness are side effects.   All medications are reviewed today. There are no reports of any problems with the medications. All of the medical conditions are reviewed and updated.  Lab work is reviewed and will be ordered as medically necessary. There are no new problems reported with today's visit.   Past Medical History:  Diagnosis Date  . Cancer [facility]) 2006   cervical  . Depression   . Diabetes mellitus without complication ([place])   . Fibromyalgia   . Hypertension    Relevant past medical, surgical, family and social history reviewed and updated as indicated. Interim medical history since our last visit reviewed. Allergies and medications reviewed and updated. DATA REVIEWED: CHART IN EPIC  Family History reviewed for pertinent findings.  Review of Systems  Constitutional: Negative.   HENT: Negative.   Eyes: Negative.   Respiratory: Negative.   Gastrointestinal: Negative.   Genitourinary: Negative.     Allergies as of 09/10/2018   No Known Allergies     Medication List       Accurate as of September 10, 2018 11:59 PM. Always use your most recent med list.        atorvastatin 10 MG tablet Commonly known as:  LIPITOR Take 1 tablet (10 mg total) by mouth daily.   Dapagliflozin-metFORMIN HCl ER 10-998 MG Tb24 Commonly known as:  Xigduo XR Take 2 tablets by mouth every morning.   Dulaglutide 1.5  MG/0.5ML Sopn Commonly known as:  Trulicity Inject 1.5 mg into the skin once a week.   lisinopril 2.5 MG tablet Commonly known as:  PRINIVIL,ZESTRIL Take 1 tablet (2.5 mg total) by mouth daily.   meloxicam 7.5 MG tablet Commonly known as:  MOBIC Take 1 tablet (7.5 mg total) by mouth 2 (two) times daily.   propranolol 20 MG tablet Commonly known as:  INDERAL Take 1 tablet (20 mg total) by mouth 2 (two) times daily.   QUEtiapine 50 MG tablet Commonly known as:  SEROQUEL Take 1 tablet (50 mg total) by mouth at bedtime.   sertraline 50 MG tablet Commonly known as:  ZOLOFT Take 1 tablet (50 mg total) by mouth daily.          Objective:    BP 114/79   Pulse 76   Temp (!) 97.5 F (36.4 C) (Oral)   Ht _1  (1.626 m)   Wt 157 lb (71.2 kg)   BMI 26.95 kg/m   No Known Allergies  Wt Readings from Last 3 Encounters:  09/10/18 157 lb (71.2 kg)  07/03/18 161 lb 2 oz (73.1 kg)  05/14/18 165 lb (74.8 kg)    Physical Exam Constitutional:      Appearance: She is well-developed.  HENT:     Head: Normocephalic and atraumatic.  Eyes:     Conjunctiva/sclera: Conjunctivae  normal.     Pupils: Pupils are equal, round, and reactive to light.  Cardiovascular:     Rate and Rhythm: Normal rate and regular rhythm.     Heart sounds: Normal heart sounds.  Pulmonary:     Effort: Pulmonary effort is normal.     Breath sounds: Normal breath sounds.  Abdominal:     General: Bowel sounds are normal.     Palpations: Abdomen is soft.  Skin:    General: Skin is warm and dry.     Findings: No rash.  Neurological:     Mental Status: She is alert and oriented to person, place, and time.     Deep Tendon Reflexes: Reflexes are normal and symmetric.  Psychiatric:        Behavior: Behavior normal.        Thought Content: Thought content normal.        Judgment: Judgment normal.     Results for orders placed or performed in visit on 09/10/18  CMP14+EGFR  Result Value Ref Range   Glucose  122 (H) 65 - 99 mg/dL   BUN 15 6 - 24 mg/dL   Creatinine, Ser 0.63 0.57 - 1.00 mg/dL   GFR calc non Af Amer 101 >59 mL/min/1.73   GFR calc Af Amer 116 >59 mL/min/1.73   BUN/Creatinine Ratio 24 (H) 9 - 23   Sodium 140 134 - 144 mmol/L   Potassium 4.7 3.5 - 5.2 mmol/L   Chloride 101 96 - 106 mmol/L   CO2 23 20 - 29 mmol/L   Calcium 9.6 8.7 - 10.2 mg/dL   Total Protein 6.5 6.0 - 8.5 g/dL   Albumin 4.3 3.8 - 4.9 g/dL   Globulin, Total 2.2 1.5 - 4.5 g/dL   Albumin/Globulin Ratio 2.0 1.2 - 2.2   Bilirubin Total 0.3 0.0 - 1.2 mg/dL   Alkaline Phosphatase 92 39 - 117 IU/L   AST 23 0 - 40 IU/L   ALT 47 (H) 0 - 32 IU/L  Lipid panel  Result Value Ref Range   Cholesterol, Total 125 100 - 199 mg/dL   Triglycerides 135 0 - 149 mg/dL   HDL 33 (L) >39 mg/dL   VLDL Cholesterol Cal 27 5 - 40 mg/dL   LDL Calculated 65 0 - 99 mg/dL   Chol/HDL Ratio 3.8 0.0 - 4.4 ratio  Bayer DCA Hb A1c Waived  Result Value Ref Range   HB A1C (BAYER DCA - WAIVED) 6.8 <7.0 %      Assessment & Plan:   1. Type 2 diabetes mellitus with microalbuminuria, without long-term current use of insulin (HCC) - CMP14+EGFR - Lipid panel - Bayer DCA Hb A1c Waived - Dulaglutide (TRULICITY) 1.5 GU/5.4YH SOPN; Inject 1.5 mg into the skin once a week.  Dispense: 12 pen; Refill: 3  2. Hyperlipidemia, unspecified hyperlipidemia type - Lipid panel   Continue all other maintenance medications as listed above.  Follow up plan: No follow-ups on file.  Educational handout given for [place] PA-C [place] [address]  [place], [place] [address] [phone]   09/12/2018, 7:45 PM

## 2018-09-28 ENCOUNTER — Other Ambulatory Visit: Payer: Self-pay | Admitting: Physician Assistant

## 2018-09-28 ENCOUNTER — Telehealth: Payer: Self-pay | Admitting: Physician Assistant

## 2018-09-28 NOTE — Telephone Encounter (Signed)
Letter completed, sending by MyChart. If it needs to be printed, that is okay.

## 2018-09-28 NOTE — Telephone Encounter (Signed)
Left detailed message per dpr  

## 2018-10-12 ENCOUNTER — Other Ambulatory Visit: Payer: Self-pay | Admitting: *Deleted

## 2018-10-12 MED ORDER — ATORVASTATIN CALCIUM 10 MG PO TABS: 10.0000 mg | ORAL_TABLET | Freq: Every day | ORAL | 1 refills | Status: DC

## 2018-10-14 DIAGNOSIS — G5601 Carpal tunnel syndrome, right upper limb: Secondary | ICD-10-CM | POA: Diagnosis not present

## 2018-10-19 DIAGNOSIS — M79641 Pain in right hand: Secondary | ICD-10-CM | POA: Diagnosis not present

## 2018-10-19 DIAGNOSIS — G5601 Carpal tunnel syndrome, right upper limb: Secondary | ICD-10-CM | POA: Diagnosis not present

## 2018-10-26 DIAGNOSIS — Z01812 Encounter for preprocedural laboratory examination: Secondary | ICD-10-CM | POA: Diagnosis not present

## 2018-10-26 DIAGNOSIS — Z1159 Encounter for screening for other viral diseases: Secondary | ICD-10-CM | POA: Diagnosis not present

## 2018-10-26 DIAGNOSIS — G5601 Carpal tunnel syndrome, right upper limb: Secondary | ICD-10-CM | POA: Diagnosis not present

## 2018-10-29 DIAGNOSIS — G5601 Carpal tunnel syndrome, right upper limb: Secondary | ICD-10-CM | POA: Diagnosis not present

## 2018-10-29 DIAGNOSIS — Z79899 Other long term (current) drug therapy: Secondary | ICD-10-CM | POA: Diagnosis not present

## 2018-10-29 DIAGNOSIS — G5603 Carpal tunnel syndrome, bilateral upper limbs: Secondary | ICD-10-CM | POA: Diagnosis not present

## 2018-10-29 DIAGNOSIS — Z791 Long term (current) use of non-steroidal anti-inflammatories (NSAID): Secondary | ICD-10-CM | POA: Diagnosis not present

## 2018-10-29 DIAGNOSIS — I10 Essential (primary) hypertension: Secondary | ICD-10-CM | POA: Diagnosis not present

## 2018-10-29 DIAGNOSIS — Z87891 Personal history of nicotine dependence: Secondary | ICD-10-CM | POA: Diagnosis not present

## 2018-10-29 DIAGNOSIS — E119 Type 2 diabetes mellitus without complications: Secondary | ICD-10-CM | POA: Diagnosis not present

## 2018-10-29 DIAGNOSIS — M79641 Pain in right hand: Secondary | ICD-10-CM | POA: Diagnosis not present

## 2018-12-14 ENCOUNTER — Ambulatory Visit: Payer: BLUE CROSS/BLUE SHIELD | Admitting: Physician Assistant

## 2018-12-16 ENCOUNTER — Other Ambulatory Visit: Payer: Self-pay

## 2018-12-17 ENCOUNTER — Encounter: Payer: Self-pay | Admitting: Physician Assistant

## 2018-12-17 ENCOUNTER — Ambulatory Visit: Payer: BLUE CROSS/BLUE SHIELD | Admitting: Physician Assistant

## 2018-12-17 VITALS — BP 133/79 | HR 75 | Temp 97.5°F | Ht 64.0 in | Wt 164.2 lb

## 2018-12-17 DIAGNOSIS — R809 Proteinuria, unspecified: Secondary | ICD-10-CM

## 2018-12-17 DIAGNOSIS — E1129 Type 2 diabetes mellitus with other diabetic kidney complication: Secondary | ICD-10-CM

## 2018-12-17 DIAGNOSIS — E1169 Type 2 diabetes mellitus with other specified complication: Secondary | ICD-10-CM

## 2018-12-17 DIAGNOSIS — Z1159 Encounter for screening for other viral diseases: Secondary | ICD-10-CM | POA: Diagnosis not present

## 2018-12-17 DIAGNOSIS — E785 Hyperlipidemia, unspecified: Secondary | ICD-10-CM

## 2018-12-17 LAB — BAYER DCA HB A1C WAIVED: HB A1C (BAYER DCA - WAIVED): 7 % — ABNORMAL HIGH (ref <7.0)

## 2018-12-17 MED ORDER — TRULICITY 1.5 MG/0.5ML ~~LOC~~ SOAJ: 1.5000 mg | SUBCUTANEOUS | 3 refills | Status: DC

## 2018-12-17 MED ORDER — SERTRALINE HCL 50 MG PO TABS: 50.0000 mg | ORAL_TABLET | Freq: Every day | ORAL | 3 refills | Status: DC

## 2018-12-17 MED ORDER — PROPRANOLOL HCL 20 MG PO TABS: 20.0000 mg | ORAL_TABLET | Freq: Two times a day (BID) | ORAL | 3 refills | Status: DC

## 2018-12-17 MED ORDER — ATORVASTATIN CALCIUM 10 MG PO TABS: 10.0000 mg | ORAL_TABLET | Freq: Every day | ORAL | 1 refills | Status: DC

## 2018-12-17 MED ORDER — XIGDUO XR 5-1000 MG PO TB24: 2.0000 | ORAL_TABLET | ORAL | 3 refills | Status: DC

## 2018-12-20 NOTE — Progress Notes (Signed)
BP 133/79   Pulse 75   Temp (!) 97.5 F (36.4 C) (Oral)   Ht 5\' 4"  (1.626 m)   Wt 164 lb 3.2 oz (74.5 kg)   BMI 28.18 kg/m    Subjective:    Patient ID: Peggy Hall, female    DOB: May 27, 1961, 58 y.o.   MRN: [id number]  HPI: Peggy Hall is a 58 y.o. female presenting on 12/17/2018 for Diabetes (3 month follow up)  The patient goes to [facility] and she does have a appointment in the coming weeks.  We have performed a foot exam today.  Patient comes for periodic recheck on her chronic medical conditions that do include diabetes, depression, hypertension, arthritis.  Overall she has been feeling quite good not having any difficulties.  She states that she knows her A1c will be up higher because she has not been eating as well during this COVID-19 restrictions.  She has been home most of the time.  Past Medical History:  Diagnosis Date  . Cancer [facility]) 2006   cervical  . Depression   . Diabetes mellitus without complication ([place])   . Fibromyalgia   . Hypertension    Relevant past medical, surgical, family and social history reviewed and updated as indicated. Interim medical history since our last visit reviewed. Allergies and medications reviewed and updated. DATA REVIEWED: CHART IN EPIC  Family History reviewed for pertinent findings.  Review of Systems  Constitutional: Negative.   HENT: Negative.   Eyes: Negative.   Respiratory: Negative.   Gastrointestinal: Negative.   Genitourinary: Negative.     Allergies as of 12/17/2018   No Known Allergies     Medication List       Accurate as of December 17, 2018 11:59 PM. If you have any questions, ask your nurse or doctor.        atorvastatin 10 MG tablet Commonly known as: LIPITOR Take 1 tablet (10 mg total) by mouth daily.   lisinopril 2.5 MG tablet Commonly known as: ZESTRIL Take 1 tablet (2.5 mg total) by mouth daily.   meloxicam 7.5 MG tablet Commonly known as: MOBIC Take 1 tablet (7.5 mg total) by  mouth 2 (two) times daily.   propranolol 20 MG tablet Commonly known as: INDERAL Take 1 tablet (20 mg total) by mouth 2 (two) times daily.   QUEtiapine 50 MG tablet Commonly known as: SEROQUEL Take 1 tablet (50 mg total) by mouth at bedtime.   sertraline 50 MG tablet Commonly known as: ZOLOFT Take 1 tablet (50 mg total) by mouth daily.   Trulicity 1.5 VQ/9.4HW Sopn Generic drug: Dulaglutide Inject 1.5 mg into the skin once a week.   Xigduo XR 10-998 MG Tb24 Generic drug: Dapagliflozin-metFORMIN HCl ER Take 2 tablets by mouth every morning.          Objective:    BP 133/79   Pulse 75   Temp (!) 97.5 F (36.4 C) (Oral)   Ht 5\' 4"  (1.626 m)   Wt 164 lb 3.2 oz (74.5 kg)   BMI 28.18 kg/m   No Known Allergies  Wt Readings from Last 3 Encounters:  12/17/18 164 lb 3.2 oz (74.5 kg)  09/10/18 157 lb (71.2 kg)  07/03/18 161 lb 2 oz (73.1 kg)    Physical Exam Constitutional:      Appearance: She is well-developed.  HENT:     Head: Normocephalic and atraumatic.  Eyes:     Conjunctiva/sclera: Conjunctivae normal.     Pupils: Pupils  are equal, round, and reactive to light.  Cardiovascular:     Rate and Rhythm: Normal rate and regular rhythm.     Heart sounds: Normal heart sounds.  Pulmonary:     Effort: Pulmonary effort is normal.     Breath sounds: Normal breath sounds.  Abdominal:     General: Bowel sounds are normal.     Palpations: Abdomen is soft.  Skin:    General: Skin is warm and dry.     Findings: No rash.  Neurological:     Mental Status: She is alert and oriented to person, place, and time.     Deep Tendon Reflexes: Reflexes are normal and symmetric.  Psychiatric:        Behavior: Behavior normal.        Thought Content: Thought content normal.        Judgment: Judgment normal.     Diabetic Foot Exam - Simple   No data filed      Results for orders placed or performed in visit on 12/17/18  Bayer DCA Hb A1c Waived  Result Value Ref Range    HB A1C (BAYER DCA - WAIVED) 7.0 (H) <7.0 %      Assessment & Plan:   1. Type 2 diabetes mellitus with microalbuminuria, without long-term current use of insulin (HCC) - Dulaglutide (TRULICITY) 1.5 BZ/1.6RC SOPN; Inject 1.5 mg into the skin once a week.  Dispense: 12 pen; Refill: 3 - Dapagliflozin-metFORMIN HCl ER (XIGDUO XR) 10-998 MG TB24; Take 2 tablets by mouth every morning.  Dispense: 180 tablet; Refill: 3 - Bayer DCA Hb A1c Waived  2. Encounter for hepatitis C screening test for low risk patient - Hepatitis C antibody; Future  3. Hyperlipidemia associated with type 2 diabetes mellitus (HCC) - atorvastatin (LIPITOR) 10 MG tablet; Take 1 tablet (10 mg total) by mouth daily.  Dispense: 90 tablet; Refill: 1   Continue all other maintenance medications as listed above.  Follow up plan: No follow-ups on file.  Educational handout given for [place] PA-C [place] [address]  [place], [place] [address] [phone]   12/20/2018, 3:13 PM

## 2018-12-22 ENCOUNTER — Telehealth: Payer: Self-pay | Admitting: Physician Assistant

## 2019-01-17 DIAGNOSIS — M545 Low back pain: Secondary | ICD-10-CM | POA: Diagnosis not present

## 2019-01-17 DIAGNOSIS — M25561 Pain in right knee: Secondary | ICD-10-CM | POA: Diagnosis not present

## 2019-03-10 ENCOUNTER — Telehealth: Payer: Self-pay | Admitting: Physician Assistant

## 2019-03-11 ENCOUNTER — Other Ambulatory Visit: Payer: Self-pay | Admitting: Physician Assistant

## 2019-03-11 DIAGNOSIS — E1169 Type 2 diabetes mellitus with other specified complication: Secondary | ICD-10-CM

## 2019-03-11 DIAGNOSIS — E1129 Type 2 diabetes mellitus with other diabetic kidney complication: Secondary | ICD-10-CM

## 2019-03-11 DIAGNOSIS — I10 Essential (primary) hypertension: Secondary | ICD-10-CM

## 2019-03-11 DIAGNOSIS — Z0000 Encounter for general adult medical examination without abnormal findings: Secondary | ICD-10-CM

## 2019-03-11 NOTE — Telephone Encounter (Signed)
Order placed

## 2019-03-11 NOTE — Telephone Encounter (Signed)
Patient aware.

## 2019-03-16 DIAGNOSIS — H5022 Vertical strabismus, left eye: Secondary | ICD-10-CM | POA: Diagnosis not present

## 2019-03-16 DIAGNOSIS — H5202 Hypermetropia, left eye: Secondary | ICD-10-CM | POA: Diagnosis not present

## 2019-03-16 LAB — HM DIABETES EYE EXAM

## 2019-03-22 ENCOUNTER — Ambulatory Visit: Payer: BC Managed Care – PPO | Admitting: Physician Assistant

## 2019-03-27 DIAGNOSIS — R52 Pain, unspecified: Secondary | ICD-10-CM | POA: Diagnosis not present

## 2019-03-27 DIAGNOSIS — J0100 Acute maxillary sinusitis, unspecified: Secondary | ICD-10-CM | POA: Diagnosis not present

## 2019-03-27 DIAGNOSIS — J029 Acute pharyngitis, unspecified: Secondary | ICD-10-CM | POA: Diagnosis not present

## 2019-03-27 DIAGNOSIS — Z6827 Body mass index (BMI) 27.0-27.9, adult: Secondary | ICD-10-CM | POA: Diagnosis not present

## 2019-03-31 ENCOUNTER — Encounter: Payer: Self-pay | Admitting: Family Medicine

## 2019-03-31 ENCOUNTER — Ambulatory Visit (INDEPENDENT_AMBULATORY_CARE_PROVIDER_SITE_OTHER): Payer: BC Managed Care – PPO | Admitting: Family Medicine

## 2019-03-31 DIAGNOSIS — U071 COVID-19: Secondary | ICD-10-CM

## 2019-03-31 DIAGNOSIS — Z7189 Other specified counseling: Secondary | ICD-10-CM

## 2019-03-31 NOTE — Progress Notes (Signed)
Virtual Visit via telephone Note Due to COVID-19 pandemic this visit was conducted virtually. This visit type was conducted due to national recommendations for restrictions regarding the COVID-19 Pandemic (e.g. social distancing, sheltering in place) in an effort to limit this patient's exposure and mitigate transmission in our community. All issues noted in this document were discussed and addressed.  A physical exam was not performed with this format.   I connected with Peggy Hall on 03/31/19 at 1035 by telephone and verified that I am speaking with the correct person using two identifiers. Peggy Hall is currently located at home and family is currently with them during visit. The provider, Monia Pouch, FNP is located in their office at time of visit.  I discussed the limitations, risks, security and privacy concerns of performing an evaluation and management service by telephone and the availability of in person appointments. I also discussed with the patient that there may be a patient responsible charge related to this service. The patient expressed understanding and agreed to proceed.  Subjective:  Patient ID: Peggy Hall, female    DOB: 01-19-1961, 58 y.o.   MRN: [id number]  Chief Complaint:  COVID-19 Infection   HPI: Peggy Hall is a 58 y.o. female presenting on 03/31/2019 for COVID-19 Infection  Pt calls today to discuss positive COVID-19 results. Pt tested positive at Urgent Care in [place], [place]. Pt is concerned about symptom management and preventing the spread of the infection. Long discussion about symptomatic care and infection prevention measures with the pt. Pt verbalized understanding. Self quarantine guidelines discussed with the pt in detail and information sent to pts MyChart.  Pt states she has rhinorrhea, cough, congestion, postnasal drip, and sinus pressure. She denies fever, chills, shortness of breath, chest pain, abdominal pain, diarrhea, rash, or weakness. Pt states  she has not required any medications for her symptoms as they are very minor.     Relevant past medical, surgical, family, and social history reviewed and updated as indicated.  Allergies and medications reviewed and updated.   Past Medical History:  Diagnosis Date  . Cancer [facility]) 2006   cervical  . Depression   . Diabetes mellitus without complication ([place])   . Fibromyalgia   . Hypertension     Past Surgical History:  Procedure Laterality Date  . ABDOMINAL HYSTERECTOMY      Social History   Socioeconomic History  . Marital status: Married    Spouse name: Not on file  . Number of children: Not on file  . Years of education: Not on file  . Highest education level: Not on file  Occupational History  . Not on file  Social Needs  . Financial resource strain: Not on file    Worry: Not on file    Inability: Not on file  . Transportation needs    Medical: Not on file    Non-medical: Not on file  Tobacco Use  . Smoking status: Never Smoker  . Smokeless tobacco: Never Used  Substance and Sexual Activity  . Alcohol use: No  . Drug use: No  . Sexual activity: Not on file Lifestyle  . Physical activity    Days per week: Not on file    Minutes per session: Not on file  . Stress: Not on file Relationships  . Intimate partner violence    Fear of current or ex partner: Not on file    Emotionally abused: Not on file    Physically abused: Not on file    Forced sexual activity: Not on file  Other Topics Concern  . Not on file  Social History Narrative  . Not on file    Outpatient Encounter Medications as of 03/31/2019  Medication Sig  . atorvastatin (LIPITOR) 10 MG tablet  Take 1 tablet (10 mg total) by mouth daily.  . Dapagliflozin-metFORMIN HCl ER (XIGDUO XR) 10-998 MG TB24 Take 2 tablets by mouth every morning.  . Dulaglutide (TRULICITY) 1.5 0000000 SOPN Inject 1.5 mg into the skin once a week.  Marland Kitchen lisinopril (PRINIVIL,ZESTRIL) 2.5 MG tablet Take 1 tablet (2.5 mg total) by mouth daily.  . meloxicam (MOBIC) 7.5 MG tablet Take 1 tablet (7.5 mg total) by mouth 2 (two) times daily.  . propranolol (INDERAL) 20 MG tablet Take 1 tablet (20 mg total) by mouth 2 (two) times daily.  . QUEtiapine (SEROQUEL) 50 MG tablet Take 1 tablet (50 mg total) by mouth at bedtime.  . sertraline (ZOLOFT) 50 MG tablet Take 1 tablet (50 mg total) by mouth daily.   No facility-administered encounter medications on file as of 03/31/2019.     No Known Allergies  Review of Systems  Constitutional: Negative for activity change, appetite change, chills, diaphoresis, fatigue, fever and unexpected weight change.  HENT: Positive for congestion, postnasal drip, rhinorrhea and sinus pressure. Negative for sinus pain, sneezing, sore throat, tinnitus, trouble swallowing and voice change.   Eyes: Negative.  Negative for photophobia and visual disturbance.  Respiratory: Positive for cough. Negative for choking, chest tightness, shortness of breath and wheezing.   Cardiovascular: Negative for chest pain, palpitations and leg swelling.  Gastrointestinal: Negative for abdominal pain, blood in stool, constipation, diarrhea, nausea and vomiting.  Endocrine: Negative.   Genitourinary: Negative for dysuria, frequency and urgency.  Musculoskeletal: Negative for arthralgias and myalgias.  Skin: Negative.  Negative for rash.  Allergic/Immunologic: Negative.   Neurological: Negative for dizziness, weakness and headaches.  Hematological: Negative.   Psychiatric/Behavioral: Negative for confusion, hallucinations, sleep disturbance and suicidal ideas.  All other systems reviewed and are negative.         Observations/Objective: No vital signs or physical exam, this was a telephone or virtual health encounter.  Pt alert and oriented, answers all questions appropriately, and able for COVID-19 Infection   Pt calls today to discuss positive COVID-19 results. Pt tested positive at Urgent Care in [place], [place]. Pt is concerned about symptom management and preventing the spread of the infection. Long discussion about symptomatic care and infection prevention measures with the pt. Pt verbalized understanding. Self quarantine guidelines discussed with the pt in detail and information sent to pts MyChart.  Pt states she has rhinorrhea, cough, congestion, postnasal drip, and sinus pressure. She denies fever, chills, shortness of breath, chest pain, abdominal pain, diarrhea, rash, or weakness. Pt states  she has not required any medications for her symptoms as they are very minor.     Relevant past medical, surgical, family, and social history reviewed and updated as indicated.  Allergies and medications reviewed and updated.   Past Medical History:  Diagnosis Date  . Cancer [facility]) 2006   cervical  . Depression   . Diabetes mellitus without complication ([place])   . Fibromyalgia   . Hypertension     Past Surgical History:  Procedure Laterality Date  . ABDOMINAL HYSTERECTOMY      Social History   Socioeconomic History  . Marital status: Married    Spouse name: Not on file  . Number of children: Not on file  . Years of education: Not on file  . Highest education level: Not on file  Occupational History  . Not on file  Social Needs  . Financial resource strain: Not on file  . Food insecurity    Worry: Not on file    Inability: Not on file  . Transportation needs    Medical: Not on file    Non-medical: Not on file  Tobacco Use  . Smoking status: Never Smoker  . Smokeless tobacco: Never Used  Substance and Sexual Activity  . Alcohol use: No  . Drug use: No  . Sexual activity: Not on file  Lifestyle  . Physical activity    Days per week: Not on file    Minutes per session: Not on file  . Stress: Not on file  Relationships  . Social Herbalist on phone: Not on file    Gets together: Not on file    Attends religious service: Not on  file    Active member of club or organization: Not on file    Attends meetings of clubs or organizations: Not on file    Relationship status: Not on file  . Intimate partner violence    Fear of current or ex partner: Not on file    Emotionally abused: Not on file    Physically abused: Not on file    Forced sexual activity: Not on file  Other Topics Concern  . Not on file  Social History Narrative  . Not on file    Outpatient Encounter Medications as of 03/31/2019  Medication Sig  . atorvastatin (LIPITOR) 10 MG tablet  Take 1 tablet (10 mg total) by mouth daily.  . Dapagliflozin-metFORMIN HCl ER (XIGDUO XR) 10-998 MG TB24 Take 2 tablets by mouth every morning.  . Dulaglutide (TRULICITY) 1.5 0000000 SOPN Inject 1.5 mg into the skin once a week.  Marland Kitchen lisinopril (PRINIVIL,ZESTRIL) 2.5 MG tablet Take 1 tablet (2.5 mg total) by mouth daily.  . meloxicam (MOBIC) 7.5 MG tablet Take 1 tablet (7.5 mg total) by mouth 2 (two) times daily.  . propranolol (INDERAL) 20 MG tablet Take 1 tablet (20 mg total) by mouth 2 (two) times daily.  . QUEtiapine (SEROQUEL) 50 MG tablet Take 1 tablet (50 mg total) by mouth at bedtime.  . sertraline (ZOLOFT) 50 MG tablet Take 1 tablet (50 mg total) by mouth daily.   No facility-administered encounter medications on file as of 03/31/2019.     No Known Allergies  Review of Systems  Constitutional: Negative for activity change, appetite change, chills, diaphoresis, fatigue, fever and unexpected weight change.  HENT: Positive for congestion, postnasal drip, rhinorrhea and sinus pressure. Negative for sinus pain, sneezing, sore throat, tinnitus, trouble swallowing and voice change.   Eyes: Negative.  Negative for photophobia and visual disturbance.  Respiratory: Positive for cough. Negative for choking, chest tightness, shortness of breath and wheezing.   Cardiovascular: Negative for chest pain, palpitations and leg swelling.  Gastrointestinal: Negative for abdominal pain, blood in stool, constipation, diarrhea, nausea and vomiting.  Endocrine: Negative.   Genitourinary: Negative for dysuria, frequency and urgency.  Musculoskeletal: Negative for arthralgias and myalgias.  Skin: Negative.  Negative for rash.  Allergic/Immunologic: Negative.   Neurological: Negative for dizziness, weakness and headaches.  Hematological: Negative.   Psychiatric/Behavioral: Negative for confusion, hallucinations, sleep disturbance and suicidal ideas.  All other systems reviewed and are negative.         Observations/Objective: No vital signs or physical exam, this was a telephone or virtual health encounter.  Pt alert and oriented, answers all questions appropriately, and able to speak in full sentences.    Assessment and Plan: Peggy Hall was seen today for covid-19 infection.  Diagnoses and all orders for this visit:  COVID-19 virus infection Pt tested positive at Urgent Care in [place], [place] yesterday. Pt was told to call PCP to discuss diagnosis and treatment of illness.   Advice given about COVID-19 virus by telephone Pt calls today to discuss positive COVID-19 results. Pt tested positive at Urgent Care in [place], [place]. Pt is concerned about symptom management and preventing the spread of the infection. Long discussion about symptomatic care and infection prevention measures with the pt. Pt verbalized understanding. Self quarantine guidelines discussed with the pt in detail and information sent to pts MyChart.     Follow Up Instructions: Return if symptoms worsen or fail to improve.    I discussed the assessment and treatment plan with the patient.  The patient was provided an opportunity to ask questions and all were answered. The patient agreed with the plan and demonstrated an understanding of the instructions.   The patient was advised to call back or seek an in-person evaluation if the symptoms worsen or if the condition fails to improve as anticipated.  The above assessment and management plan was discussed with the patient. The patient verbalized understanding of and has agreed to the management plan. Patient is aware to call the clinic if they develop any new symptoms or if symptoms persist or worsen. Patient is aware when to return to the clinic for a follow-up visit. Patient educated on when it is appropriate to go to the emergency department.    I provided 15 minutes of non-face-to-face time during this encounter. The call started at 1035. The call ended at 1050. The other time  was used for coordination of care.    Monia Pouch, FNP-C [facility] [address] [place], [place] [address] [phone] 03/31/19

## 2019-04-07 ENCOUNTER — Telehealth: Payer: Self-pay | Admitting: Physician Assistant

## 2019-04-07 ENCOUNTER — Other Ambulatory Visit: Payer: Self-pay

## 2019-04-08 ENCOUNTER — Encounter: Payer: Self-pay | Admitting: Physician Assistant

## 2019-04-08 ENCOUNTER — Ambulatory Visit: Payer: BC Managed Care – PPO | Admitting: Physician Assistant

## 2019-04-08 ENCOUNTER — Ambulatory Visit (INDEPENDENT_AMBULATORY_CARE_PROVIDER_SITE_OTHER): Payer: BC Managed Care – PPO | Admitting: Physician Assistant

## 2019-04-08 DIAGNOSIS — E1169 Type 2 diabetes mellitus with other specified complication: Secondary | ICD-10-CM

## 2019-04-08 DIAGNOSIS — M2550 Pain in unspecified joint: Secondary | ICD-10-CM

## 2019-04-08 DIAGNOSIS — R809 Proteinuria, unspecified: Secondary | ICD-10-CM

## 2019-04-08 DIAGNOSIS — E1129 Type 2 diabetes mellitus with other diabetic kidney complication: Secondary | ICD-10-CM

## 2019-04-08 DIAGNOSIS — F32A Depression, unspecified: Secondary | ICD-10-CM

## 2019-04-08 DIAGNOSIS — E785 Hyperlipidemia, unspecified: Secondary | ICD-10-CM

## 2019-04-08 DIAGNOSIS — F329 Major depressive disorder, single episode, unspecified: Secondary | ICD-10-CM

## 2019-04-08 MED ORDER — QUETIAPINE FUMARATE 50 MG PO TABS: 50.0000 mg | ORAL_TABLET | Freq: Every day | ORAL | 5 refills | Status: DC

## 2019-04-08 MED ORDER — LISINOPRIL 2.5 MG PO TABS: 2.5000 mg | ORAL_TABLET | Freq: Every day | ORAL | 3 refills | Status: DC

## 2019-04-08 MED ORDER — ATORVASTATIN CALCIUM 10 MG PO TABS: 10.0000 mg | ORAL_TABLET | Freq: Every day | ORAL | 1 refills | Status: DC

## 2019-04-08 MED ORDER — MELOXICAM 7.5 MG PO TABS: 7.5000 mg | ORAL_TABLET | Freq: Two times a day (BID) | ORAL | 3 refills | Status: DC

## 2019-04-08 NOTE — Progress Notes (Signed)
Telephone visit  Subjective: CC: Recheck on chronic conditions and recheck on COVID-19 infection PCP: Terald Sleeper, PA-C [id number] Peggy Hall is a 58 y.o. female calls for telephone consult today. Patient provides verbal consent for consult held via phone.  Patient is identified with 2 separate identifiers.  At this time the entire area is on COVID-19 social distancing and stay home orders are in place.  Patient is of higher risk and therefore we are performing this by a virtual method.  Location of patient: The patient was infected Location of provider: [facility] Others present for call: home  On October 4 the patient started having some mild Covid symptoms.  She did test positive.  She started with a fever further into the illness.  But at this point the only thing she has left is some fatigue.  She did quarantine herself for about 10 days after that.  She reports that overall she is doing better.  She has returned to work and when she gets home from work she just goes to sleep because she is quite tired.  She denies any issues with wheezing.  All of her medications are reviewed.  And she states overall she is doing well with everything else.   ROS: Per HPI  No Known Allergies Past Medical History:  Diagnosis Date  . Cancer [facility]) 2006   cervical  . Depression   . Diabetes mellitus without complication ([place])   . Fibromyalgia   . Hypertension     Current Outpatient Medications:  .  atorvastatin (LIPITOR) 10 MG tablet, Take 1 tablet (10 mg total) by mouth daily., Disp: 90 tablet, Rfl: 1 .  Dapagliflozin-metFORMIN HCl ER (XIGDUO XR) 10-998 MG TB24, Take 2 tablets by mouth every morning., Disp: 180 tablet, Rfl: 3 .  Dulaglutide (TRULICITY) 1.5 0000000 SOPN, Inject 1.5 mg into the skin once a week., Disp: 12 pen, Rfl: 3 .  lisinopril (ZESTRIL) 2.5 MG tablet, Take 1 tablet (2.5 mg total) by mouth daily., Disp: 90 tablet, Rfl: 3 .  meloxicam (MOBIC) 7.5 MG tablet, Take 1 tablet  (7.5 mg total) by mouth 2 (two) times daily., Disp: 180 tablet, Rfl: 3 .  propranolol (INDERAL) 20 MG tablet, Take 1 tablet (20 mg total) by mouth 2 (two) times daily., Disp: 180 tablet, Rfl: 3 .  QUEtiapine (SEROQUEL) 50 MG tablet, Take 1 tablet (50 mg total) by mouth at bedtime., Disp: 30 tablet, Rfl: 5 .  sertraline (ZOLOFT) 50 MG tablet, Take 1 tablet (50 mg total) by mouth daily., Disp: 90 tablet, Rfl: 3  Assessment/ Plan: 58 y.o. female   1. Hyperlipidemia associated with type 2 diabetes mellitus (HCC) - atorvastatin (LIPITOR) 10 MG tablet; Take 1 tablet (10 mg total) by mouth daily.  Dispense: 90 tablet; Refill: 1  2. Type 2 diabetes mellitus with microalbuminuria, without long-term current use of insulin (HCC) - lisinopril (ZESTRIL) 2.5 MG tablet; Take 1 tablet (2.5 mg total) by mouth daily.  Dispense: 90 tablet; Refill: 3  3. Arthralgia, unspecified joint - meloxicam (MOBIC) 7.5 MG tablet; Take 1 tablet (7.5 mg total) by mouth 2 (two) times daily.  Dispense: 180 tablet; Refill: 3  4. Depression, unspecified depression type - QUEtiapine (SEROQUEL) 50 MG tablet; Take 1 tablet (50 mg total) by mouth at bedtime.  Dispense: 30 tablet; Refill: 5   No follow-ups on file.  Continue all other maintenance medications as listed above.  Start time: 1:48 PM End time: 1:58 PM  Meds ordered  this encounter  Medications  . atorvastatin (LIPITOR) 10 MG tablet    Sig: Take 1 tablet (10 mg total) by mouth daily.    Dispense:  90 tablet    Refill:  1    Order Specific Question:   Supervising Provider    Answer:   Brooke Bonito  . lisinopril (ZESTRIL) 2.5 MG tablet    Sig: Take 1 tablet (2.5 mg total) by mouth daily.    Dispense:  90 tablet    Refill:  3    Order Specific Question:   Supervising Provider    Answer:   Brooke Bonito  . meloxicam (MOBIC) 7.5 MG tablet    Sig: Take 1 tablet (7.5 mg total) by mouth 2 (two) times daily.    Dispense:  180 tablet     Refill:  3    Order Specific Question:   Supervising Provider    Answer:   Brooke Bonito  . QUEtiapine (SEROQUEL) 50 MG tablet    Sig: Take 1 tablet (50 mg total) by mouth at bedtime.    Dispense:  30 tablet    Refill:  5    Please consider 90 day supplies to promote better adherence    Order Specific Question:   Supervising Provider    Answer:   Terald Sleeper [place] PA-C [facility] [phone]

## 2019-04-28 DIAGNOSIS — Z1231 Encounter for screening mammogram for malignant neoplasm of breast: Secondary | ICD-10-CM | POA: Diagnosis not present

## 2019-08-16 ENCOUNTER — Other Ambulatory Visit: Payer: Self-pay

## 2019-08-18 ENCOUNTER — Encounter: Payer: Self-pay | Admitting: Physician Assistant

## 2019-08-18 ENCOUNTER — Other Ambulatory Visit: Payer: Self-pay

## 2019-08-18 ENCOUNTER — Ambulatory Visit: Payer: BC Managed Care – PPO | Admitting: Physician Assistant

## 2019-08-18 VITALS — BP 121/75 | HR 91 | Temp 97.3°F | Ht 64.0 in | Wt 164.0 lb

## 2019-08-18 DIAGNOSIS — I10 Essential (primary) hypertension: Secondary | ICD-10-CM | POA: Diagnosis not present

## 2019-08-18 DIAGNOSIS — Z0000 Encounter for general adult medical examination without abnormal findings: Secondary | ICD-10-CM

## 2019-08-18 DIAGNOSIS — E1169 Type 2 diabetes mellitus with other specified complication: Secondary | ICD-10-CM

## 2019-08-18 DIAGNOSIS — R809 Proteinuria, unspecified: Secondary | ICD-10-CM

## 2019-08-18 DIAGNOSIS — F32A Depression, unspecified: Secondary | ICD-10-CM

## 2019-08-18 DIAGNOSIS — M5432 Sciatica, left side: Secondary | ICD-10-CM | POA: Diagnosis not present

## 2019-08-18 DIAGNOSIS — F329 Major depressive disorder, single episode, unspecified: Secondary | ICD-10-CM

## 2019-08-18 DIAGNOSIS — Z029 Encounter for administrative examinations, unspecified: Secondary | ICD-10-CM

## 2019-08-18 DIAGNOSIS — Z1159 Encounter for screening for other viral diseases: Secondary | ICD-10-CM

## 2019-08-18 DIAGNOSIS — E1129 Type 2 diabetes mellitus with other diabetic kidney complication: Secondary | ICD-10-CM

## 2019-08-18 DIAGNOSIS — E785 Hyperlipidemia, unspecified: Secondary | ICD-10-CM | POA: Diagnosis not present

## 2019-08-18 LAB — BAYER DCA HB A1C WAIVED: HB A1C (BAYER DCA - WAIVED): 7.1 % — ABNORMAL HIGH (ref <7.0)

## 2019-08-18 MED ORDER — ATORVASTATIN CALCIUM 10 MG PO TABS: 10.0000 mg | ORAL_TABLET | Freq: Every day | ORAL | 1 refills | Status: DC

## 2019-08-18 MED ORDER — QUETIAPINE FUMARATE 50 MG PO TABS: 50.0000 mg | ORAL_TABLET | Freq: Every day | ORAL | 3 refills | Status: DC

## 2019-08-18 NOTE — Progress Notes (Signed)
BP 121/75   Pulse 91   Temp (!) 97.3 F (36.3 C) (Temporal)   Ht _0  (1.626 m)   Wt 164 lb (74.4 kg)   SpO2 95%   BMI 28.15 kg/m    Subjective:    Patient ID: Peggy Hall, female    DOB: [birth year], 59 y.o.   MRN: [id number]  HPI 1. Type 2 diabetes mellitus with microalbuminuria, without long-term current use of insulin ([place])  2. Well adult exam  3. Hyperlipidemia associated with type 2 diabetes mellitus ([place])  4. Essential hypertension  5. Encounter for hepatitis C screening test for low risk patient  6. Sciatica of left side  7. Depression, unspecified depression type   HPI: Peggy Hall is a 59 y.o. female presenting on 08/18/2019 for Medical Management of Chronic Issues and Diabetes  This patient comes in for recheck of her chronic conditions.  Her history does not include, hyperlipidemia, hypertension, sciatica and depression.  She reports that overall she has been doing very well not having any difficulties.  She does follow-up for sugars of been fairly well controlled.  She is still working full-time.  But the most bothersome thing is low back pain going down her legs at times.  She also has stiffness of multiple joints.  Past Medical History:  Diagnosis Date  . Cancer [facility]) 2006   cervical  . Depression   . Diabetes mellitus without complication ([place])   . Fibromyalgia   . Hypertension    Relevant past medical, surgical, family and social history reviewed and updated as indicated. Interim medical history since our last visit reviewed. Allergies and medications reviewed and updated. DATA REVIEWED: CHART IN EPIC  Family History reviewed for pertinent findings.  Review of Systems  Constitutional: Negative.  Negative for activity change, fatigue and fever.  HENT: Negative.   Eyes: Negative.   Respiratory: Negative.  Negative for cough.   Cardiovascular: Negative.  Negative for chest pain.  Gastrointestinal: Negative.  Negative for abdominal pain.    Endocrine: Negative.   Genitourinary: Negative.  Negative for dysuria.  Musculoskeletal: Positive for arthralgias and myalgias.  Skin: Negative.   Neurological: Negative.     Allergies as of 08/18/2019   No Known Allergies     Medication List       Accurate as of August 18, 2019 11:59 PM. If you have any questions, ask your nurse or doctor.        atorvastatin 10 MG tablet Commonly known as: LIPITOR Take 1 tablet (10 mg total) by mouth daily.   lisinopril 2.5 MG tablet Commonly known as: ZESTRIL Take 1 tablet (2.5 mg total) by mouth daily.   meloxicam 7.5 MG tablet Commonly known as: MOBIC Take 1 tablet (7.5 mg total) by mouth 2 (two) times daily.   propranolol 20 MG tablet Commonly known as: INDERAL Take 1 tablet (20 mg total) by mouth 2 (two) times daily.   QUEtiapine 50 MG tablet Commonly known as: SEROQUEL Take 1 tablet (50 mg total) by mouth at bedtime.   sertraline 50 MG tablet Commonly known as: ZOLOFT Take 1 tablet (50 mg total) by mouth daily.   Trulicity 1.5 GD/9.2EQ Sopn Generic drug: Dulaglutide Inject 1.5 mg into the skin once a week.   Xigduo XR 10-998 MG Tb24 Generic drug: Dapagliflozin-metFORMIN HCl ER Take 2 tablets by mouth every morning.          Objective:    BP 121/75   Pulse 91   Temp (!)  97.3 F (36.3 C) (Temporal)   Ht _0  (1.626 m)   Wt 164 lb (74.4 kg)   SpO2 95%   BMI 28.15 kg/m   No Known Allergies  Wt Readings from Last 3 Encounters:  08/18/19 164 lb (74.4 kg)  12/17/18 164 lb 3.2 oz (74.5 kg)  09/10/18 157 lb (71.2 kg)    Physical Exam Constitutional:      General: She is not in acute distress.    Appearance: Normal appearance. She is well-developed.  HENT:     Head: Normocephalic and atraumatic.  Cardiovascular:     Rate and Rhythm: Normal rate.  Pulmonary:     Effort: Pulmonary effort is normal.  Skin:    General: Skin is warm and dry.     Findings: No rash.  Neurological:     Mental Status: She  is alert and oriented to person, place, and time.     Deep Tendon Reflexes: Reflexes are normal and symmetric.    Diabetic Foot Exam - Simple   Simple Foot Form Diabetic Foot exam was performed with the following findings: Yes 08/18/2019  2:37 PM  Visual Inspection No deformities, no ulcerations, no other skin breakdown bilaterally: Yes Sensation Testing Intact to touch and monofilament testing bilaterally: Yes Pulse Check Posterior Tibialis and Dorsalis pulse intact bilaterally: Yes Comments      Results for orders placed or performed in visit on 08/18/19  Bayer DCA Hb A1c Waived  Result Value Ref Range   HB A1C (BAYER DCA - WAIVED) 7.1 (H) <7.0 %  TSH  Result Value Ref Range   TSH 1.870 0.450 - 4.500 uIU/mL  Microalbumin / creatinine urine ratio  Result Value Ref Range   Creatinine, Urine 67.5 Not Estab. mg/dL   Microalbumin, Urine <3.0 Not Estab. ug/mL   Microalb/Creat Ratio <4 0 - 29 mg/g creat  Lipid panel  Result Value Ref Range   Cholesterol, Total 165 100 - 199 mg/dL   Triglycerides 350 (H) 0 - 149 mg/dL   HDL 39 (L) >39 mg/dL   VLDL Cholesterol Cal 55 (H) 5 - 40 mg/dL   LDL Chol Calc (NIH) 71 0 - 99 mg/dL   Chol/HDL Ratio 4.2 0.0 - 4.4 ratio  CMP14+EGFR  Result Value Ref Range   Glucose 110 (H) 65 - 99 mg/dL   BUN 12 6 - 24 mg/dL   Creatinine, Ser 0.70 0.57 - 1.00 mg/dL   GFR calc non Af Amer 96 >59 mL/min/1.73   GFR calc Af Amer 111 >59 mL/min/1.73   BUN/Creatinine Ratio 17 9 - 23   Sodium 142 134 - 144 mmol/L   Potassium 5.1 3.5 - 5.2 mmol/L   Chloride 98 96 - 106 mmol/L   CO2 25 20 - 29 mmol/L   Calcium 10.4 (H) 8.7 - 10.2 mg/dL   Total Protein 6.8 6.0 - 8.5 g/dL   Albumin 4.5 3.8 - 4.9 g/dL   Globulin, Total 2.3 1.5 - 4.5 g/dL   Albumin/Globulin Ratio 2.0 1.2 - 2.2   Bilirubin Total <0.2 0.0 - 1.2 mg/dL   Alkaline Phosphatase 105 39 - 117 IU/L   AST 31 0 - 40 IU/L   ALT 63 (H) 0 - 32 IU/L  CBC with Differential/Platelet  Result Value Ref Range     WBC 11.0 (H) 3.4 - 10.8 x10E3/uL   RBC 5.15 3.77 - 5.28 x10E6/uL   Hemoglobin 14.9 11.1 - 15.9 g/dL   Hematocrit 46.4 34.0 - 46.6 %   MCV  90 79 - 97 fL   MCH 28.9 26.6 - 33.0 pg   MCHC 32.1 31.5 - 35.7 g/dL   RDW 12.5 11.7 - 15.4 %   Platelets 353 150 - 450 x10E3/uL   Neutrophils 63 Not Estab. %   Lymphs 23 Not Estab. %   Monocytes 9 Not Estab. %   Eos 3 Not Estab. %   Basos 1 Not Estab. %   Neutrophils Absolute 7.2 (H) 1.4 - 7.0 x10E3/uL   Lymphocytes Absolute 2.5 0.7 - 3.1 x10E3/uL   Monocytes Absolute 0.9 0.1 - 0.9 x10E3/uL   EOS (ABSOLUTE) 0.3 0.0 - 0.4 x10E3/uL   Basophils Absolute 0.1 0.0 - 0.2 x10E3/uL   Immature Granulocytes 1 Not Estab. %   Immature Grans (Abs) 0.1 0.0 - 0.1 x10E3/uL  Hepatitis C antibody  Result Value Ref Range   Hep C Virus Ab <0.1 0.0 - 0.9 s/co ratio      Assessment & Plan:   1. Type 2 diabetes mellitus with microalbuminuria, without long-term current use of insulin (HCC) - Bayer DCA Hb A1c Waived - TSH - Microalbumin / creatinine urine ratio - CMP14+EGFR - CBC with Differential/Platelet  2. Well adult exam - Bayer DCA Hb A1c Waived - TSH - Microalbumin / creatinine urine ratio - Lipid panel - CMP14+EGFR - CBC with Differential/Platelet  3. Hyperlipidemia associated with type 2 diabetes mellitus (HCC) - Lipid panel - atorvastatin (LIPITOR) 10 MG tablet; Take 1 tablet (10 mg total) by mouth daily.  Dispense: 90 tablet; Refill: 1  4. Essential hypertension - CMP14+EGFR - CBC with Differential/Platelet  5. Encounter for hepatitis C screening test for low risk patient - Hepatitis C antibody  6. Sciatica of left side  7. Depression, unspecified depression type - QUEtiapine (SEROQUEL) 50 MG tablet; Take 1 tablet (50 mg total) by mouth at bedtime.  Dispense: 90 tablet; Refill: 3   Continue all other maintenance medications as listed above.  Follow up plan: Return in about 4 months (around 12/16/2019).  Educational handout given  for [place] PA-C [facility] [address]  [place], [place] [address] [phone]   08/21/2019, 10:03 PM

## 2019-08-19 LAB — CMP14+EGFR
ALT: 63 IU/L — ABNORMAL HIGH (ref 0–32)
AST: 31 IU/L (ref 0–40)
Albumin/Globulin Ratio: 2 (ref 1.2–2.2)
Albumin: 4.5 g/dL (ref 3.8–4.9)
Alkaline Phosphatase: 105 IU/L (ref 39–117)
BUN/Creatinine Ratio: 17 (ref 9–23)
BUN: 12 mg/dL (ref 6–24)
Bilirubin Total: <0.2 mg/dL (ref 0.0–1.2)
CO2: 25 mmol/L (ref 20–29)
Calcium: 10.4 mg/dL — ABNORMAL HIGH (ref 8.7–10.2)
Chloride: 98 mmol/L (ref 96–106)
Creatinine, Ser: 0.7 mg/dL (ref 0.57–1.00)
GFR calc Af Amer: 111 mL/min/1.73 (ref >59)
GFR calc non Af Amer: 96 mL/min/1.73 (ref >59)
Globulin, Total: 2.3 g/dL (ref 1.5–4.5)
Glucose: 110 mg/dL — ABNORMAL HIGH (ref 65–99)
Potassium: 5.1 mmol/L (ref 3.5–5.2)
Sodium: 142 mmol/L (ref 134–144)
Total Protein: 6.8 g/dL (ref 6.0–8.5)

## 2019-08-19 LAB — CBC WITH DIFFERENTIAL/PLATELET
Basophils Absolute: 0.1 10*3/uL (ref 0.0–0.2)
Basos: 1 %
EOS (ABSOLUTE): 0.3 10*3/uL (ref 0.0–0.4)
Eos: 3 %
Hematocrit: 46.4 % (ref 34.0–46.6)
Hemoglobin: 14.9 g/dL (ref 11.1–15.9)
Immature Grans (Abs): 0.1 10*3/uL (ref 0.0–0.1)
Immature Granulocytes: 1 %
Lymphocytes Absolute: 2.5 10*3/uL (ref 0.7–3.1)
Lymphs: 23 %
MCH: 28.9 pg (ref 26.6–33.0)
MCHC: 32.1 g/dL (ref 31.5–35.7)
MCV: 90 fL (ref 79–97)
Monocytes Absolute: 0.9 10*3/uL (ref 0.1–0.9)
Monocytes: 9 %
Neutrophils Absolute: 7.2 10*3/uL — ABNORMAL HIGH (ref 1.4–7.0)
Neutrophils: 63 %
Platelets: 353 10*3/uL (ref 150–450)
RBC: 5.15 x10E6/uL (ref 3.77–5.28)
RDW: 12.5 % (ref 11.7–15.4)
WBC: 11 10*3/uL — ABNORMAL HIGH (ref 3.4–10.8)

## 2019-08-19 LAB — TSH: TSH: 1.87 u[IU]/mL (ref 0.450–4.500)

## 2019-08-19 LAB — LIPID PANEL
Chol/HDL Ratio: 4.2 ratio (ref 0.0–4.4)
Cholesterol, Total: 165 mg/dL (ref 100–199)
HDL: 39 mg/dL — ABNORMAL LOW (ref >39)
LDL Chol Calc (NIH): 71 mg/dL (ref 0–99)
Triglycerides: 350 mg/dL — ABNORMAL HIGH (ref 0–149)
VLDL Cholesterol Cal: 55 mg/dL — ABNORMAL HIGH (ref 5–40)

## 2019-08-19 LAB — HEPATITIS C ANTIBODY: Hep C Virus Ab: <0.1 {s_co_ratio} (ref 0.0–0.9)

## 2019-08-19 LAB — MICROALBUMIN / CREATININE URINE RATIO
Creatinine, Urine: 67.5 mg/dL
Microalb/Creat Ratio: <4 mg/g creat (ref 0–29)
Microalbumin, Urine: <3 ug/mL

## 2019-09-15 ENCOUNTER — Other Ambulatory Visit: Payer: Self-pay

## 2019-09-15 ENCOUNTER — Other Ambulatory Visit: Payer: Self-pay | Admitting: Family Medicine

## 2019-09-15 ENCOUNTER — Other Ambulatory Visit: Payer: BC Managed Care – PPO

## 2019-09-15 DIAGNOSIS — D72829 Elevated white blood cell count, unspecified: Secondary | ICD-10-CM

## 2019-09-15 LAB — CBC WITH DIFFERENTIAL/PLATELET
Basophils Absolute: 0.1 10*3/uL (ref 0.0–0.2)
Basos: 1 %
EOS (ABSOLUTE): 0.1 10*3/uL (ref 0.0–0.4)
Eos: 2 %
Hematocrit: 41 % (ref 34.0–46.6)
Hemoglobin: 13.9 g/dL (ref 11.1–15.9)
Immature Grans (Abs): 0 10*3/uL (ref 0.0–0.1)
Immature Granulocytes: 1 %
Lymphocytes Absolute: 1.9 10*3/uL (ref 0.7–3.1)
Lymphs: 25 %
MCH: 30.6 pg (ref 26.6–33.0)
MCHC: 33.9 g/dL (ref 31.5–35.7)
MCV: 90 fL (ref 79–97)
Monocytes Absolute: 0.6 10*3/uL (ref 0.1–0.9)
Monocytes: 8 %
Neutrophils Absolute: 4.8 10*3/uL (ref 1.4–7.0)
Neutrophils: 63 %
Platelets: 297 10*3/uL (ref 150–450)
RBC: 4.54 x10E6/uL (ref 3.77–5.28)
RDW: 12.2 % (ref 11.7–15.4)
WBC: 7.5 10*3/uL (ref 3.4–10.8)

## 2019-12-01 ENCOUNTER — Other Ambulatory Visit: Payer: Self-pay | Admitting: *Deleted

## 2019-12-01 DIAGNOSIS — E1129 Type 2 diabetes mellitus with other diabetic kidney complication: Secondary | ICD-10-CM

## 2019-12-01 MED ORDER — TRULICITY 1.5 MG/0.5ML ~~LOC~~ SOAJ: 1.5000 mg | SUBCUTANEOUS | 0 refills | Status: DC

## 2019-12-06 DIAGNOSIS — Z791 Long term (current) use of non-steroidal anti-inflammatories (NSAID): Secondary | ICD-10-CM | POA: Diagnosis not present

## 2019-12-06 DIAGNOSIS — M150 Primary generalized (osteo)arthritis: Secondary | ICD-10-CM | POA: Diagnosis not present

## 2019-12-06 DIAGNOSIS — M797 Fibromyalgia: Secondary | ICD-10-CM | POA: Diagnosis not present

## 2019-12-09 ENCOUNTER — Telehealth: Payer: Self-pay | Admitting: Family

## 2019-12-09 NOTE — Telephone Encounter (Signed)
Pt wanted to know if she would have labs drawn at her appt with [facility]. Advised pt that Peggy Hall would order any tests she felt necessary at her appt. Pt voiced understanding.

## 2019-12-16 ENCOUNTER — Ambulatory Visit: Payer: BC Managed Care – PPO | Admitting: Physician Assistant

## 2019-12-19 ENCOUNTER — Other Ambulatory Visit: Payer: Self-pay

## 2019-12-19 ENCOUNTER — Ambulatory Visit: Payer: BC Managed Care – PPO | Admitting: Family

## 2019-12-19 ENCOUNTER — Encounter: Payer: Self-pay | Admitting: Family

## 2019-12-19 VITALS — BP 106/70 | HR 86 | Temp 98.1°F | Ht 64.0 in | Wt 161.8 lb

## 2019-12-19 DIAGNOSIS — E1169 Type 2 diabetes mellitus with other specified complication: Secondary | ICD-10-CM | POA: Diagnosis not present

## 2019-12-19 DIAGNOSIS — I10 Essential (primary) hypertension: Secondary | ICD-10-CM | POA: Diagnosis not present

## 2019-12-19 DIAGNOSIS — F329 Major depressive disorder, single episode, unspecified: Secondary | ICD-10-CM | POA: Diagnosis not present

## 2019-12-19 DIAGNOSIS — E785 Hyperlipidemia, unspecified: Secondary | ICD-10-CM | POA: Diagnosis not present

## 2019-12-19 DIAGNOSIS — F32A Depression, unspecified: Secondary | ICD-10-CM

## 2019-12-19 DIAGNOSIS — M797 Fibromyalgia: Secondary | ICD-10-CM

## 2019-12-19 DIAGNOSIS — F411 Generalized anxiety disorder: Secondary | ICD-10-CM | POA: Insufficient documentation

## 2019-12-19 LAB — BAYER DCA HB A1C WAIVED: HB A1C (BAYER DCA - WAIVED): 7.9 % — ABNORMAL HIGH (ref <7.0)

## 2019-12-19 NOTE — Patient Instructions (Signed)
Health Maintenance, Female Adopting a healthy lifestyle and getting preventive care are important in promoting health and wellness. Ask your health care provider about:  The right schedule for you to have regular tests and exams.  Things you can do on your own to prevent diseases and keep yourself healthy. What should I know about diet, weight, and exercise? Eat a healthy diet   Eat a diet that includes plenty of vegetables, fruits, low-fat dairy products, and lean protein.  Do not eat a lot of foods that are high in solid fats, added sugars, or sodium. Maintain a healthy weight Body mass index (BMI) is used to identify weight problems. It estimates body fat based on height and weight. Your health care provider can help determine your BMI and help you achieve or maintain a healthy weight. Get regular exercise Get regular exercise. This is one of the most important things you can do for your health. Most adults should:  Exercise for at least 150 minutes each week. The exercise should increase your heart rate and make you sweat (moderate-intensity exercise).  Do strengthening exercises at least twice a week. This is in addition to the moderate-intensity exercise.  Spend less time sitting. Even light physical activity can be beneficial. Watch cholesterol and blood lipids Have your blood tested for lipids and cholesterol at 58 years of age, then have this test every 5 years. Have your cholesterol levels checked more often if:  Your lipid or cholesterol levels are high.  You are older than 58 years of age.  You are at high risk for heart disease. What should I know about cancer screening? Depending on your health history and family history, you may need to have cancer screening at various ages. This may include screening for:  Breast cancer.  Cervical cancer.  Colorectal cancer.  Skin cancer.  Lung cancer. What should I know about heart disease, diabetes, and high blood  pressure? Blood pressure and heart disease  High blood pressure causes heart disease and increases the risk of stroke. This is more likely to develop in people who have high blood pressure readings, are of African descent, or are overweight.  Have your blood pressure checked: ? Every 3-5 years if you are 18-39 years of age. ? Every year if you are 40 years old or older. Diabetes Have regular diabetes screenings. This checks your fasting blood sugar level. Have the screening done:  Once every three years after age 40 if you are at a normal weight and have a low risk for diabetes.  More often and at a younger age if you are overweight or have a high risk for diabetes. What should I know about preventing infection? Hepatitis B If you have a higher risk for hepatitis B, you should be screened for this virus. Talk with your health care provider to find out if you are at risk for hepatitis B infection. Hepatitis C Testing is recommended for:  Everyone born from 1945 through 1965.  Anyone with known risk factors for hepatitis C. Sexually transmitted infections (STIs)  Get screened for STIs, including gonorrhea and chlamydia, if: ? You are sexually active and are younger than 58 years of age. ? You are older than 58 years of age and your health care provider tells you that you are at risk for this type of infection. ? Your sexual activity has changed since you were last screened, and you are at increased risk for chlamydia or gonorrhea. Ask your health care provider if   you are at risk.  Ask your health care provider about whether you are at high risk for HIV. Your health care provider may recommend a prescription medicine to help prevent HIV infection. If you choose to take medicine to prevent HIV, you should first get tested for HIV. You should then be tested every 3 months for as long as you are taking the medicine. Pregnancy  If you are about to stop having your period (premenopausal) and  you may become pregnant, seek counseling before you get pregnant.  Take 400 to 800 micrograms (mcg) of folic acid every day if you become pregnant.  Ask for birth control (contraception) if you want to prevent pregnancy. Osteoporosis and menopause Osteoporosis is a disease in which the bones lose minerals and strength with aging. This can result in bone fractures. If you are 65 years old or older, or if you are at risk for osteoporosis and fractures, ask your health care provider if you should:  Be screened for bone loss.  Take a calcium or vitamin D supplement to lower your risk of fractures.  Be given hormone replacement therapy (HRT) to treat symptoms of menopause. Follow these instructions at home: Lifestyle  Do not use any products that contain nicotine or tobacco, such as cigarettes, e-cigarettes, and chewing tobacco. If you need help quitting, ask your health care provider.  Do not use street drugs.  Do not share needles.  Ask your health care provider for help if you need support or information about quitting drugs. Alcohol use  Do not drink alcohol if: ? Your health care provider tells you not to drink. ? You are pregnant, may be pregnant, or are planning to become pregnant.  If you drink alcohol: ? Limit how much you use to 0-1 drink a day. ? Limit intake if you are breastfeeding.  Be aware of how much alcohol is in your drink. In the [place] one drink equals one 12 oz bottle of beer (355 mL), one 5 oz glass of wine (148 mL), or one 1 oz glass of hard liquor (44 mL). General instructions  Schedule regular health, dental, and eye exams.  Stay current with your vaccines.  Tell your health care provider if: ? You often feel depressed. ? You have ever been abused or do not feel safe at home. Summary  Adopting a healthy lifestyle and getting preventive care are important in promoting health and wellness.  Follow your health care provider's instructions about healthy  diet, exercising, and getting tested or screened for diseases.  Follow your health care provider's instructions on monitoring your cholesterol and blood pressure. This information is not intended to replace advice given to you by your health care provider. Make sure you discuss any questions you have with your health care provider. Document Revised: 06/02/2018 Document Reviewed: 06/02/2018 Elsevier Patient Education  2020 Elsevier Inc.  

## 2019-12-19 NOTE — Progress Notes (Signed)
Subjective:    Patient ID: Peggy Hall, female    DOB: [birth year], 59 y.o.   MRN: [id number]  Chief Complaint  Patient presents with  . Medical Management of Chronic Issues   Pt presents to the office today to establish care with me. She is followed by Rheumatologists every annually for Fibromyalgia. She was started on Etodolac 545m BID that is working well at this time.  Diabetes She presents for her follow-up diabetic visit. She has type 2 diabetes mellitus. Her disease course has been stable. Hypoglycemia symptoms include nervousness/anxiousness. Pertinent negatives for diabetes include no blurred vision and no foot paresthesias. Symptoms are stable. Pertinent negatives for diabetic complications include no CVA, heart disease, nephropathy or peripheral neuropathy. Risk factors for coronary artery disease include hypertension, diabetes mellitus and dyslipidemia. (Does not check anything) An ACE inhibitor/angiotensin II receptor blocker is being taken. Eye exam is current.  Hypertension This is a chronic problem. The current episode started more than 1 year ago. The problem has been resolved since onset. Associated symptoms include anxiety and malaise/fatigue. Pertinent negatives include no blurred vision, peripheral edema or shortness of breath. Risk factors for coronary artery disease include sedentary lifestyle and dyslipidemia. The current treatment provides moderate improvement. There is no history of CVA.  Hyperlipidemia This is a chronic problem. The current episode started more than 1 year ago. The problem is uncontrolled. Recent lipid tests were reviewed and are high. Pertinent negatives include no shortness of breath. Current antihyperlipidemic treatment includes statins. The current treatment provides moderate improvement of lipids. Risk factors for coronary artery disease include hypertension, a sedentary lifestyle, post-menopausal, dyslipidemia and diabetes mellitus.  Depression         This is a chronic problem.  The current episode started more than 1 year ago.   The onset quality is gradual.   The problem occurs intermittently.  Associated symptoms include no decreased concentration, no helplessness, no hopelessness, not irritable, no restlessness and not sad.  Past medical history includes anxiety.   Anxiety Presents for follow-up visit. Symptoms include nervous/anxious behavior. Patient reports no decreased concentration, depressed mood, irritability, malaise, restlessness or shortness of breath.        Review of Systems  Constitutional: Positive for malaise/fatigue. Negative for irritability.  Eyes: Negative for blurred vision.  Respiratory: Negative for shortness of breath.   Psychiatric/Behavioral: Positive for depression. Negative for decreased concentration. The patient is nervous/anxious.   All other systems reviewed and are negative.      Objective:   Physical Exam Vitals reviewed.  Constitutional:      General: She is not irritable.She is not in acute distress.    Appearance: She is well-developed.  HENT:     Head: Normocephalic and atraumatic.     Right Ear: Tympanic membrane normal.     Left Ear: Tympanic membrane normal.  Eyes:     Pupils: Pupils are equal, round, and reactive to light.  Neck:     Thyroid: No thyromegaly.  Cardiovascular:     Rate and Rhythm: Normal rate and regular rhythm.     Heart sounds: Normal heart sounds. No murmur heard.   Pulmonary:     Effort: Pulmonary effort is normal. No respiratory distress.     Breath sounds: Normal breath sounds. No wheezing.  Abdominal:     General: Bowel sounds are normal. There is no distension.     Palpations: Abdomen is soft.     Tenderness: There is no abdominal tenderness.  Musculoskeletal:  General: No tenderness. Normal range of motion.     Cervical back: Normal range of motion and neck supple.  Skin:    General: Skin is warm and dry.  Neurological:     Mental Status:  She is alert and oriented to person, place, and time.     Cranial Nerves: No cranial nerve deficit.     Deep Tendon Reflexes: Reflexes are normal and symmetric.  Psychiatric:        Behavior: Behavior normal.        Thought Content: Thought content normal.        Judgment: Judgment normal.       BP 106/70   Pulse 86   Temp 98.1 F (36.7 C) (Temporal)   Ht 5' 4"  (1.626 m)   Wt 161 lb 12.8 oz (73.4 kg)   SpO2 98%   BMI 27.77 kg/m      Assessment & Plan:  Peggy Hall comes in today with chief complaint of Medical Management of Chronic Issues   Diagnosis and orders addressed:  1. Essential hypertension - CMP14+EGFR - CBC with Differential/Platelet  2. Hyperlipidemia associated with type 2 diabetes mellitus ([place]) - CMP14+EGFR - CBC with Differential/Platelet - Lipid panel  3. Type 2 diabetes mellitus with other specified complication, without long-term current use of insulin (HCC) - CMP14+EGFR - CBC with Differential/Platelet - Bayer DCA Hb A1c Waived  4. Depression, unspecified depression type - CMP14+EGFR - CBC with Differential/Platelet  5. Hyperlipidemia, unspecified hyperlipidemia type - CMP14+EGFR - CBC with Differential/Platelet  6. Fibromyalgia - CMP14+EGFR - CBC with Differential/Platelet  7. GAD (generalized anxiety disorder)   Labs pending Health Maintenance reviewed Diet and exercise encouraged  Follow up plan: 6 months    Evelina Dun, FNP

## 2019-12-20 ENCOUNTER — Other Ambulatory Visit: Payer: Self-pay | Admitting: Family

## 2019-12-20 DIAGNOSIS — R748 Abnormal levels of other serum enzymes: Secondary | ICD-10-CM

## 2019-12-20 LAB — CBC WITH DIFFERENTIAL/PLATELET
Basophils Absolute: 0.1 10*3/uL (ref 0.0–0.2)
Basos: 1 %
EOS (ABSOLUTE): 0.2 10*3/uL (ref 0.0–0.4)
Eos: 2 %
Hematocrit: 41.4 % (ref 34.0–46.6)
Hemoglobin: 14 g/dL (ref 11.1–15.9)
Immature Grans (Abs): 0.1 10*3/uL (ref 0.0–0.1)
Immature Granulocytes: 1 %
Lymphocytes Absolute: 2.4 10*3/uL (ref 0.7–3.1)
Lymphs: 26 %
MCH: 30.8 pg (ref 26.6–33.0)
MCHC: 33.8 g/dL (ref 31.5–35.7)
MCV: 91 fL (ref 79–97)
Monocytes Absolute: 0.8 10*3/uL (ref 0.1–0.9)
Monocytes: 9 %
Neutrophils Absolute: 5.8 10*3/uL (ref 1.4–7.0)
Neutrophils: 61 %
Platelets: 292 10*3/uL (ref 150–450)
RBC: 4.55 x10E6/uL (ref 3.77–5.28)
RDW: 11.8 % (ref 11.7–15.4)
WBC: 9.3 10*3/uL (ref 3.4–10.8)

## 2019-12-20 LAB — CMP14+EGFR
ALT: 69 IU/L — ABNORMAL HIGH (ref 0–32)
AST: 29 IU/L (ref 0–40)
Albumin/Globulin Ratio: 1.8 (ref 1.2–2.2)
Albumin: 4.3 g/dL (ref 3.8–4.9)
Alkaline Phosphatase: 98 IU/L (ref 48–121)
BUN/Creatinine Ratio: 18 (ref 9–23)
BUN: 14 mg/dL (ref 6–24)
Bilirubin Total: 0.3 mg/dL (ref 0.0–1.2)
CO2: 22 mmol/L (ref 20–29)
Calcium: 9.3 mg/dL (ref 8.7–10.2)
Chloride: 107 mmol/L — ABNORMAL HIGH (ref 96–106)
Creatinine, Ser: 0.78 mg/dL (ref 0.57–1.00)
GFR calc Af Amer: 98 mL/min/{1.73_m2} (ref >59)
GFR calc non Af Amer: 85 mL/min/{1.73_m2} (ref >59)
Globulin, Total: 2.4 g/dL (ref 1.5–4.5)
Glucose: 137 mg/dL — ABNORMAL HIGH (ref 65–99)
Potassium: 4.8 mmol/L (ref 3.5–5.2)
Sodium: 143 mmol/L (ref 134–144)
Total Protein: 6.7 g/dL (ref 6.0–8.5)

## 2019-12-20 LAB — LIPID PANEL
Chol/HDL Ratio: 4.3 ratio (ref 0.0–4.4)
Cholesterol, Total: 142 mg/dL (ref 100–199)
HDL: 33 mg/dL — ABNORMAL LOW (ref >39)
LDL Chol Calc (NIH): 62 mg/dL (ref 0–99)
Triglycerides: 296 mg/dL — ABNORMAL HIGH (ref 0–149)
VLDL Cholesterol Cal: 47 mg/dL — ABNORMAL HIGH (ref 5–40)

## 2019-12-30 ENCOUNTER — Ambulatory Visit (HOSPITAL_COMMUNITY)
Admission: RE | Admit: 2019-12-30 | Discharge: 2019-12-30 | Disposition: A | Discharge status: Home / Self Care | Payer: BC Managed Care – PPO | Source: Ambulatory Visit | Attending: Family | Admitting: Family

## 2019-12-30 ENCOUNTER — Other Ambulatory Visit: Payer: Self-pay

## 2019-12-30 DIAGNOSIS — R748 Abnormal levels of other serum enzymes: Secondary | ICD-10-CM | POA: Diagnosis not present

## 2019-12-30 DIAGNOSIS — K8020 Calculus of gallbladder without cholecystitis without obstruction: Secondary | ICD-10-CM | POA: Diagnosis not present

## 2020-01-02 ENCOUNTER — Encounter: Payer: Self-pay | Admitting: Pharmacist

## 2020-01-02 ENCOUNTER — Other Ambulatory Visit: Payer: Self-pay | Admitting: Family

## 2020-01-02 ENCOUNTER — Other Ambulatory Visit: Payer: Self-pay

## 2020-01-02 ENCOUNTER — Ambulatory Visit: Payer: BC Managed Care – PPO | Admitting: Pharmacist

## 2020-01-02 DIAGNOSIS — K760 Fatty (change of) liver, not elsewhere classified: Secondary | ICD-10-CM

## 2020-01-02 DIAGNOSIS — E1169 Type 2 diabetes mellitus with other specified complication: Secondary | ICD-10-CM

## 2020-01-02 NOTE — Progress Notes (Signed)
° ° °  01/02/2020 Name: Peggy Hall MRN: [id number] DOB: 05-17-1961   S:  57 yoF presents for diabetes evaluation, education, and management Patient was referred and last seen by Primary Care Provider on 12/19/19.  Insurance coverage/medication affordability: BCBS commercial  Patient reports adherence with medications.  Current diabetes medications include: Trulicity, Xigduo XR  Current hypertension medications include: lisinopril Goal 130/80  Current hyperlipidemia medications include: atorvastatin  Patient denies hypoglycemic events.   Patient reported dietary habits: Eats 3 meals/day   Breakfast: boiled eggs, protein shake   Lunch: veggies, protein   Dinner: shrimp, steak, pizza, baked potatoe    Snacks: popcorn   Drinks:water, diet, protein shakes  Discussed meal planning options and Plate method for healthy eating  Avoid sugary drinks and desserts  Incorporate balanced protein, non starchy veggies, 1 serving of carbohydrate with each meal  Increase water intake  Increase physical activity as able   Patient-reported exercise habits: increase walking   Patient denies nocturia (nighttime urination).  Patient denies neuropathy (nerve pain). Patient does have fibromylagia  Patient denies visual changes.  Patient denies self foot exams.    O:  Lab Results  Component Value Date   HGBA1C 7.9 (H) 12/19/2019     Lipid Panel     Component Value Date/Time   CHOL 142 12/19/2019 1546   TRIG 296 (H) 12/19/2019 1546   HDL 33 (L) 12/19/2019 1546   CHOLHDL 4.3 12/19/2019 1546   LDLCALC 62 12/19/2019 1546   LDLDIRECT 122 (H) 11/07/2016 1351     Home fasting blood sugars: 94, 145, 135  2 hour post-meal/random blood sugars: 180-215    A/P:  Diabetes T2DM A1c increasing.  Patient'saitnet's A1c has been controlled before.  Will allow for patient to try diet and exercise.  -Female diabetic patient allowed 30-45 carbs per meal (x3) plus (x2) 15 g carb  snacks per day.  Encouraged patient to carb count to aid in A1c lowering and overall health.  -Continue Trulicity 1.5 mg sq weekly (consider increase to 3mg  sq weekly if not controlled at f/u)  -Free voucher given  -Continue Xigduo XR 03/1999   -Cop[employer] card given for ease of access  -Extensively discussed pathophysiology of diabetes, recommended lifestyle interventions, dietary effects on blood sugar control  -Counseled on s/sx of and management of hypoglycemia  -Next A1C anticipated 05/2020.     Written patient instructions provided.  Total time in face to face counseling 30 minutes.   Follow up Pharmacist Clinic Visit in 2 months.   Regina Eck, PharmD, BCPS Clinical Pharmacist, [place]  II Phone [phone]

## 2020-01-10 ENCOUNTER — Encounter: Payer: Self-pay | Admitting: Internal Medicine

## 2020-01-26 DIAGNOSIS — Z20828 Contact with and (suspected) exposure to other viral communicable diseases: Secondary | ICD-10-CM | POA: Diagnosis not present

## 2020-02-10 DIAGNOSIS — M150 Primary generalized (osteo)arthritis: Secondary | ICD-10-CM | POA: Diagnosis not present

## 2020-02-10 DIAGNOSIS — Z791 Long term (current) use of non-steroidal anti-inflammatories (NSAID): Secondary | ICD-10-CM | POA: Diagnosis not present

## 2020-02-10 DIAGNOSIS — M797 Fibromyalgia: Secondary | ICD-10-CM | POA: Diagnosis not present

## 2020-02-18 ENCOUNTER — Other Ambulatory Visit: Payer: Self-pay | Admitting: Family

## 2020-02-18 DIAGNOSIS — E1129 Type 2 diabetes mellitus with other diabetic kidney complication: Secondary | ICD-10-CM

## 2020-02-18 DIAGNOSIS — R809 Proteinuria, unspecified: Secondary | ICD-10-CM

## 2020-03-05 ENCOUNTER — Telehealth: Payer: BC Managed Care – PPO | Admitting: Pharmacist

## 2020-03-06 ENCOUNTER — Ambulatory Visit: Payer: BC Managed Care – PPO | Admitting: Gastroenterology

## 2020-03-07 ENCOUNTER — Telehealth: Payer: Self-pay | Admitting: Family

## 2020-03-07 MED ORDER — SERTRALINE HCL 50 MG PO TABS: 50.0000 mg | ORAL_TABLET | Freq: Every day | ORAL | 1 refills | Status: DC

## 2020-03-07 NOTE — Telephone Encounter (Signed)
Refilled med to [facility]. Left detailed message on patients voicemail

## 2020-03-07 NOTE — Telephone Encounter (Signed)
  Prescription Request  03/07/2020  What is the name of the medication or equipment? sertraline (ZOLOFT) 50 MG tablet  Have you contacted your pharmacy to request a refill? (if applicable) no. She has apt with Christy on 06/19/2020  Which pharmacy would you like this sent to? [facility]   Patient notified that their request is being sent to the clinical staff for review and that they should receive a response within 2 business days.

## 2020-03-12 ENCOUNTER — Telehealth: Payer: Self-pay | Admitting: Family

## 2020-03-12 DIAGNOSIS — E1129 Type 2 diabetes mellitus with other diabetic kidney complication: Secondary | ICD-10-CM

## 2020-03-12 MED ORDER — XIGDUO XR 5-1000 MG PO TB24: 2.0000 | ORAL_TABLET | ORAL | 3 refills | Status: DC

## 2020-03-12 NOTE — Telephone Encounter (Signed)
Sent!

## 2020-03-12 NOTE — Telephone Encounter (Signed)
  Prescription Request  03/12/2020  What is the name of the medication or equipment? Dapagliflozin-metFORMIN HCl ER (XIGDUO XR) 10-998 MG TB24  Have you contacted your pharmacy to request a refill? (if applicable) yes  Which pharmacy would you like this sent to? [facility]   Patient notified that their request is being sent to the clinical staff for review and that they should receive a response within 2 business days.

## 2020-03-14 ENCOUNTER — Other Ambulatory Visit: Payer: Self-pay

## 2020-03-14 ENCOUNTER — Ambulatory Visit: Payer: BC Managed Care – PPO | Admitting: Gastroenterology

## 2020-03-14 ENCOUNTER — Encounter: Payer: Self-pay | Admitting: Gastroenterology

## 2020-03-14 VITALS — BP 113/70 | HR 96 | Temp 97.0°F | Ht 64.0 in | Wt 163.8 lb

## 2020-03-14 DIAGNOSIS — K760 Fatty (change of) liver, not elsewhere classified: Secondary | ICD-10-CM

## 2020-03-14 DIAGNOSIS — R945 Abnormal results of liver function studies: Secondary | ICD-10-CM

## 2020-03-14 DIAGNOSIS — R7989 Other specified abnormal findings of blood chemistry: Secondary | ICD-10-CM

## 2020-03-14 DIAGNOSIS — Z1211 Encounter for screening for malignant neoplasm of colon: Secondary | ICD-10-CM | POA: Diagnosis not present

## 2020-03-14 NOTE — Progress Notes (Signed)
Primary Care Physician:  Sharion Balloon, FNP  Primary Gastroenterologist:  Garfield Cornea, MD   Chief Complaint  Patient presents with  . Elevated Hepatic Enzymes    US done 12/2019    HPI:  Peggy Hall is a 59 y.o. female with history of diabetes, hypertension, hyperlipidemia presenting at the request of Evelina Dun, FNP for further evaluation of hepatic steatosis.  Patient has a history of chronically elevated ALT dating back at least to 2018.  Labs back in June with ALT of 69, LFTs otherwise unremarkable.  A1c was 7.9.  Triglycerides 296, HDL 33, LDL 62, total cholesterol 142.  Hepatitis C antibody -6 months ago.  RUQ U/S 12/2019: IMPRESSION: Cholelithiasis without sonographic evidence of acute cholecystitis. Diffusely increased hepatic echogenicity and diminished through transmission compatible with moderate to severe hepatic steatosis  Weight stable over the past 3 years at 163 lb. Did not know her LFTs were elevated. Diagnosed with DM in 2007. No FH liver disease. Denies abd pain, n/v, heartburn, melena, brbpr. Several years has had postprandial loose stools off/on. Takes dapagliflozin-metformin in AMs. Has to know where bathrooms are when she goes out. Last colonoscopy in 2016, had adenomatous colon polyps and advised to follow up in 5 years. Procedure done in [place]. Patient denies etoh use. Has never weighed more than 165 pounds.    Current Outpatient Medications  Medication Sig Dispense Refill  . atorvastatin (LIPITOR) 10 MG tablet Take 1 tablet (10 mg total) by mouth daily. 90 tablet 1  . Dapagliflozin-metFORMIN HCl ER (XIGDUO XR) 10-998 MG TB24 Take 2 tablets by mouth every morning. 180 tablet 3  . etodolac (LODINE) 500 MG tablet Take 500 mg by mouth 2 (two) times daily.    Marland Kitchen lisinopril (ZESTRIL) 2.5 MG tablet Take 1 tablet (2.5 mg total) by mouth daily. 90 tablet 3  . propranolol (INDERAL) 20 MG tablet Take 1 tablet (20 mg total) by mouth 2 (two) times daily. 180  tablet 3  . QUEtiapine (SEROQUEL) 50 MG tablet Take 1 tablet (50 mg total) by mouth at bedtime. 90 tablet 3  . sertraline (ZOLOFT) 50 MG tablet Take 1 tablet (50 mg total) by mouth daily. 90 tablet 1  . TRULICITY 1.5 ZO/1.0RU SOPN INJECT 1 DOSE SUBCUTANEOUSLY ONCE A WEEK 12 mL 0   No current facility-administered medications for this visit.    Allergies as of 03/14/2020  . (No Known Allergies)    Past Medical History:  Diagnosis Date  . Cancer [facility]) 2006   cervical  . Depression   . Diabetes mellitus without complication ([place])   . Fibromyalgia   . Hepatic steatosis   . Hyperlipidemia   . Hypertension     Past Surgical History:  Procedure Laterality Date  . ABDOMINAL HYSTERECTOMY    . COLONOSCOPY  09/2014   [place]: two adenomatous colon polyps removed. advised 5 year follow up colonoscopy    Family History  Problem Relation Age of Onset  . Stroke Mother   . Heart disease Mother   . Depression Mother   . Dementia Mother   . Stroke Father   . Hypertension Father   . Diabetes Father   . Hyperlipidemia Father   . Peripheral Artery Disease Father   . Bipolar disorder Sister   . Cancer Maternal Grandmother           . Colon cancer Maternal Grandmother   . Liver disease Neg Hx     Social History   Socioeconomic History  . Marital  status: Married    Spouse name: Not on file  . Number of children: Not on file  . Years of education: Not on file  . Highest education level: Not on file  Occupational History  . Not on file  Tobacco Use  . Smoking status: Former Smoker    Types: Cigarettes  . Smokeless tobacco: Never Used  Vaping Use  . Vaping Use: Never used  Substance and Sexual Activity  . Alcohol use: No  . Drug use: No  . Sexual activity: Not on file  Other Topics Concern  . Not on file  Social History Narrative  . Not on file   Social Determinants of Health   Financial Resource Strain:   . Difficulty of Paying Living Expenses: Not on file   Food Insecurity:   . Worried About Charity fundraiser in the Last Year: Not on file  . Ran Out of Food in the Last Year: Not on file  Transportation Needs:   . Lack of Transportation (Medical): Not on file  . Lack of Transportation (Non-Medical): Not on file  Physical Activity:   . Days of Exercise per Week: Not on file  . Minutes of Exercise per Session: Not on file  Stress:   . Feeling of Stress : Not on file  Social Connections:   . Frequency of Communication with Friends and Family: Not on file  . Frequency of Social Gatherings with Friends and Family: Not on file  . Attends Religious Services: Not on file  . Active Member of Clubs or Organizations: Not on file  . Attends Archivist Meetings: Not on file  . Marital Status: Not on file  Intimate Partner Violence:   . Fear of Current or Ex-Partner: Not on file  . Emotionally Abused: Not on file  . Physically Abused: Not on file  . Sexually Abused: Not on file      ROS:  General: Negative for anorexia, weight loss, fever, chills, fatigue, weakness. Eyes: Negative for vision changes.  ENT: Negative for hoarseness, difficulty swallowing , nasal congestion. CV: Negative for chest pain, angina, palpitations, dyspnea on exertion, peripheral edema.  Respiratory: Negative for dyspnea at rest, dyspnea on exertion, cough, sputum, wheezing.  GI: See history of present illness. GU:  Negative for dysuria, hematuria, urinary incontinence, urinary frequency, nocturnal urination.  MS: Negative for joint pain, low back pain.  Derm: Negative for rash or itching.  Neuro: Negative for weakness, abnormal sensation, seizure, frequent headaches, memory loss, confusion.  Psych: Negative for anxiety, depression, suicidal ideation, hallucinations.  Endo: Negative for unusual weight change.  Heme: Negative for bruising or bleeding. Allergy: Negative for rash or hives.    Physical Examination:  BP 113/70   Pulse 96   Temp (!) 97 F  (36.1 C) (Oral)   Ht 5\' 4"  (1.626 m)   Wt 163 lb 12.8 oz (74.3 kg)   BMI 28.12 kg/m    General: Well-nourished, well-developed in no acute distress.  Head: Normocephalic, atraumatic.   Eyes: Conjunctiva pink, no icterus. Mouth: masked Neck: Supple without thyromegaly, masses, or lymphadenopathy.  Lungs: Clear to auscultation bilaterally.  Heart: Regular rate and rhythm, no murmurs rubs or gallops.  Abdomen: Bowel sounds are normal, nontender, nondistended, no splenomegaly or masses, no abdominal bruits or    hernia , no rebound or guarding.  Liver edge palpable just below the RCM in MCL Rectal: not performed Extremities: No lower extremity edema. No clubbing or deformities.  Neuro: Alert and  oriented x 4 , grossly normal neurologically.  Skin: Warm and dry, no rash or jaundice.   Psych: Alert and cooperative, normal mood and affect.  Labs: Lab Results  Component Value Date   CREATININE 0.78 12/19/2019   BUN 14 12/19/2019   NA 143 12/19/2019   K 4.8 12/19/2019   CL 107 (H) 12/19/2019   CO2 22 12/19/2019   Lab Results  Component Value Date   ALT 69 (H) 12/19/2019   AST 29 12/19/2019   ALKPHOS 98 12/19/2019   BILITOT 0.3 12/19/2019   Lab Results  Component Value Date   WBC 9.3 12/19/2019   HGB 14.0 12/19/2019   HCT 41.4 12/19/2019   MCV 91 12/19/2019   PLT 292 12/19/2019   Lab Results  Component Value Date   HGBA1C 7.9 (H) 12/19/2019     Impression/plan:  Pleasant 59 year old female presenting for further evaluation of hepatic steatosis noted on ultrasound, abnormal LFTs.  Patient has history of chronic diabetes dating back to 2006/2007.  She is overweight with a BMI of 28.  Denies history of alcohol use.  Suspect NASH in the setting of diabetes/excessive weight.  ALT may be elevated due to hepatic steatosis and/or statin use.  Obtain labs including updated LFTs, screen for celiac, hemochromatosis, autoimmune hepatitis, hepatitis B.  Discussed need for regular  exercise, tight glycemic control, consider 10 pound weight loss.  We will plan to see her back in 6 months for follow-up.  She has some loose stools postprandially and sometimes unrelated to meals.  Notices over the past few years.  May be diabetic enteropathy, related to medication (Metformin), IBS, less likely IBD or malignancy.  She does have some solid stools making microscopic colitis less likely.  She is due for colonoscopy for history of adenomatous colon polyps.  Offered her colonoscopy at this time she would like to hold off for now.  She will call if she changes her mind.

## 2020-03-14 NOTE — Patient Instructions (Signed)
1. Please have labs done. We will call with results as available.  2. Please let us know if you want to pursue colonoscopy here.  3. Return to the office in six months.    Fatty Liver Disease  Fatty liver disease occurs when too much fat has built up in your liver cells. Fatty liver disease is also called hepatic steatosis or steatohepatitis. The liver removes harmful substances from your bloodstream and produces fluids that your body needs. It also helps your body use and store energy from the food you eat. In many cases, fatty liver disease does not cause symptoms or problems. It is often diagnosed when tests are being done for other reasons. However, over time, fatty liver can cause inflammation that may lead to more serious liver problems, such as scarring of the liver (cirrhosis) and liver failure. Fatty liver is associated with insulin resistance, increased body fat, high blood pressure (hypertension), and high cholesterol. These are features of metabolic syndrome and increase your risk for stroke, diabetes, and heart disease. What are the causes? This condition may be caused by:  Drinking too much alcohol.  Poor nutrition.  Obesity.  Cushing's syndrome.  Diabetes.  High cholesterol.  Certain drugs.  Poisons.  Some viral infections.  Pregnancy. What increases the risk? You are more likely to develop this condition if you:  Abuse alcohol.  Are overweight.  Have diabetes.  Have hepatitis.  Have a high triglyceride level.  Are pregnant. What are the signs or symptoms? Fatty liver disease often does not cause symptoms. If symptoms do develop, they can include:  Fatigue.  Weakness.  Weight loss.  Confusion.  Abdominal pain.  Nausea and vomiting.  Yellowing of your skin and the white parts of your eyes (jaundice).  Itchy skin. How is this diagnosed? This condition may be diagnosed by:  A physical exam and medical history.  Blood tests.  Imaging  tests, such as an ultrasound, CT scan, or MRI.  A liver biopsy. A small sample of liver tissue is removed using a needle. The sample is then looked at under a microscope. How is this treated? Fatty liver disease is often caused by other health conditions. Treatment for fatty liver may involve medicines and lifestyle changes to manage conditions such as:  Alcoholism.  High cholesterol.  Diabetes.  Being overweight or obese. Follow these instructions at home:   Do not drink alcohol. If you have trouble quitting, ask your health care provider how to safely quit with the help of medicine or a supervised program. This is important to keep your condition from getting worse.  Eat a healthy diet as told by your health care provider. Ask your health care provider about working with a diet and nutrition specialist (dietitian) to develop an eating plan.  Exercise regularly. This can help you lose weight and control your cholesterol and diabetes. Talk to your health care provider about an exercise plan and which activities are best for you.  Take over-the-counter and prescription medicines only as told by your health care provider.  Keep all follow-up visits as told by your health care provider. This is important. Contact a health care provider if: You have trouble controlling your:  Blood sugar. This is especially important if you have diabetes.  Cholesterol.  Drinking of alcohol. Get help right away if:  You have abdominal pain.  You have jaundice.  You have nausea and vomiting.  You vomit blood or material that looks like coffee grounds.  You have  stools that are black, tar-like, or bloody. Summary  Fatty liver disease develops when too much fat builds up in the cells of your liver.  Fatty liver disease often causes no symptoms or problems. However, over time, fatty liver can cause inflammation that may lead to more serious liver problems, such as scarring of the liver  (cirrhosis).  You are more likely to develop this condition if you abuse alcohol, are pregnant, are overweight, have diabetes, have hepatitis, or have high triglyceride levels.  Contact your health care provider if you have trouble controlling your weight, blood sugar, cholesterol, or drinking of alcohol. This information is not intended to replace advice given to you by your health care provider. Make sure you discuss any questions you have with your health care provider. Document Revised: 05/22/2017 Document Reviewed: 03/18/2017 Elsevier Patient Education  2020 [place].   Nonalcoholic Fatty Liver Disease Diet, Adult Nonalcoholic fatty liver disease is a condition that causes fat to build up in and around the liver. The disease makes it harder for the liver to work the way that it should. Following a healthy diet can help to keep nonalcoholic fatty liver disease under control. It can also help to prevent or improve conditions that are associated with the disease, such as heart disease, diabetes, high blood pressure, and abnormal cholesterol levels. Along with regular exercise, this diet:  Promotes weight loss.  Helps to control blood sugar levels.  Helps to improve the way that the body uses insulin. What are tips for following this plan? Reading food labels Always check food labels for:  The amount of saturated fat in a food. You should limit your intake of saturated fat. Saturated fat is found in foods that come from animals, including meat and dairy products such as butter, cheese, and whole milk.  The amount of fiber in a food. You should choose high-fiber foods such as fruits, vegetables, and whole grains. Try to get 25-30 grams (g) of fiber a day.  Cooking  When cooking, use heart-healthy oils that are high in monounsaturated fats. These include olive oil, canola oil, and avocado oil.  Limit frying or deep-frying foods. Cook foods using healthy methods such as baking,  boiling, steaming, and grilling instead. Meal planning  You may want to keep track of how many calories you take in. Eating the right amount of calories will help you achieve a healthy weight. Meeting with a registered dietitian can help you get started.  Limit how often you eat takeout and fast food. These foods are usually very high in fat, salt, and sugar.  Use the glycemic index (GI) to plan your meals. The index tells you how quickly a food will raise your blood sugar. Choose low-GI foods (GI less than 55). These foods take a longer time to raise blood sugar. A registered dietitian can help you identify foods lower on the GI scale. Lifestyle  You may want to follow a Mediterranean diet. This diet includes a lot of vegetables, lean meats or fish, whole grains, fruits, and healthy oils and fats. What foods can I eat?  Fruits Bananas. Apples. Oranges. Grapes. Papaya. Mango. Pomegranate. Kiwi. Grapefruit. Cherries. Vegetables Lettuce. Spinach. Peas. Beets. Cauliflower. Cabbage. Broccoli. Carrots. Tomatoes. Squash. Eggplant. Herbs. Peppers. Onions. Cucumbers. Brussels sprouts. Yams and sweet potatoes. Beans. Lentils. Grains Whole wheat or whole-grain foods, including breads, crackers, cereals, and pasta. Stone-ground whole wheat. Unsweetened oatmeal. Bulgur. Barley. Quinoa. Brown or wild rice. Corn or whole wheat flour tortillas. Meats and other  proteins Lean meats. Poultry. Tofu. Seafood and shellfish. Dairy Low-fat or fat-free dairy products, such as yogurt, cottage cheese, or cheese. Beverages Water. Sugar-free drinks. Tea. Coffee. Low-fat or skim milk. Milk alternatives, such as soy or almond milk. Real fruit juice. Fats and oils Avocado. Canola or olive oil. Nuts and nut butters. Seeds. Seasonings and condiments Mustard. Relish. Low-fat, low-sugar ketchup and barbecue sauce. Low-fat or fat-free mayonnaise. Sweets and desserts Sugar-free sweets. The items listed above may not be a  complete list of foods and beverages you can eat. Contact a dietitian for more information. What foods should I limit or avoid? Meats and other proteins Limit red meat to 1-2 times a week. Dairy [employer]. Fats and oils Palm oil and coconut oil. Fried foods. Other foods Processed foods. Foods that contain a lot of salt or sodium. Sweets and desserts Sweets that contain sugar. Beverages Sweetened drinks, such as sweet tea, milkshakes, iced sweet drinks, and sodas. Alcohol. The items listed above may not be a complete list of foods and beverages you should avoid. Contact a dietitian for more information. Where to find more information The [employer] of Diabetes and Digestive and Kidney Diseases: [employer] Summary  Nonalcoholic fatty liver disease is a condition that causes fat to build up in and around the liver.  Following a healthy diet can help to keep nonalcoholic fatty liver disease under control. Your diet should be rich in fruits, vegetables, whole grains, and lean proteins.  Limit your intake of saturated fat. Saturated fat is found in foods that come from animals, including meat and dairy products such as butter, cheese, and whole milk.  This diet promotes weight loss, helps to control blood sugar levels, and helps to improve the way that the body uses insulin. This information is not intended to replace advice given to you by your health care provider. Make sure you discuss any questions you have with your health care provider. Document Revised: 10/01/2018 Document Reviewed: 07/01/2018 Elsevier Patient Education  [place].

## 2020-03-14 NOTE — Progress Notes (Signed)
Cc'ed to pcp °

## 2020-03-28 ENCOUNTER — Other Ambulatory Visit: Payer: Self-pay

## 2020-03-28 MED ORDER — PROPRANOLOL HCL 20 MG PO TABS: 20.0000 mg | ORAL_TABLET | Freq: Two times a day (BID) | ORAL | 0 refills | Status: DC

## 2020-03-28 NOTE — Telephone Encounter (Signed)
  Prescription Request  03/28/2020  What is the name of the medication or equipment? propranolol (INDERAL) 20 MG tablet  Have you contacted your pharmacy to request a refill? (if applicable) yes/ she called last week and they don't have it. She will be taking her last pill today  Which pharmacy would you like this sent to? [facility]   Patient notified that their request is being sent to the clinical staff for review and that they should receive a response within 2 business days.

## 2020-03-28 NOTE — Telephone Encounter (Signed)
LMOVM refill sent to pharmacy 

## 2020-04-24 DIAGNOSIS — Z1231 Encounter for screening mammogram for malignant neoplasm of breast: Secondary | ICD-10-CM | POA: Diagnosis not present

## 2020-05-07 ENCOUNTER — Other Ambulatory Visit: Payer: Self-pay | Admitting: *Deleted

## 2020-05-07 DIAGNOSIS — E1169 Type 2 diabetes mellitus with other specified complication: Secondary | ICD-10-CM

## 2020-05-07 MED ORDER — ATORVASTATIN CALCIUM 10 MG PO TABS: 10.0000 mg | ORAL_TABLET | Freq: Every day | ORAL | 0 refills | Status: DC

## 2020-05-11 DIAGNOSIS — M150 Primary generalized (osteo)arthritis: Secondary | ICD-10-CM | POA: Diagnosis not present

## 2020-05-11 DIAGNOSIS — M797 Fibromyalgia: Secondary | ICD-10-CM | POA: Diagnosis not present

## 2020-05-11 DIAGNOSIS — Z791 Long term (current) use of non-steroidal anti-inflammatories (NSAID): Secondary | ICD-10-CM | POA: Diagnosis not present

## 2020-05-14 DIAGNOSIS — M797 Fibromyalgia: Secondary | ICD-10-CM | POA: Diagnosis not present

## 2020-05-14 DIAGNOSIS — K760 Fatty (change of) liver, not elsewhere classified: Secondary | ICD-10-CM | POA: Diagnosis not present

## 2020-05-14 DIAGNOSIS — R945 Abnormal results of liver function studies: Secondary | ICD-10-CM | POA: Diagnosis not present

## 2020-05-15 ENCOUNTER — Other Ambulatory Visit: Payer: Self-pay | Admitting: Family

## 2020-05-15 DIAGNOSIS — E1129 Type 2 diabetes mellitus with other diabetic kidney complication: Secondary | ICD-10-CM

## 2020-05-15 DIAGNOSIS — R809 Proteinuria, unspecified: Secondary | ICD-10-CM

## 2020-05-15 LAB — HEPATIC FUNCTION PANEL
ALT: 87 IU/L — ABNORMAL HIGH (ref 0–32)
AST: 37 IU/L (ref 0–40)
Albumin: 4.7 g/dL (ref 3.8–4.9)
Alkaline Phosphatase: 103 IU/L (ref 44–121)
Bilirubin Total: 0.3 mg/dL (ref 0.0–1.2)
Bilirubin, Direct: 0.11 mg/dL (ref 0.00–0.40)
Total Protein: 6.8 g/dL (ref 6.0–8.5)

## 2020-05-15 LAB — IRON,TIBC AND FERRITIN PANEL
Ferritin: 94 ng/mL (ref 15–150)
Iron Saturation: 18 % (ref 15–55)
Iron: 58 ug/dL (ref 27–159)
Total Iron Binding Capacity: 328 ug/dL (ref 250–450)
UIBC: 270 ug/dL (ref 131–425)

## 2020-05-15 LAB — ANA: Anti Nuclear Antibody (ANA): NEGATIVE

## 2020-05-15 LAB — IGG, IGA, IGM
IgA/Immunoglobulin A, Serum: 200 mg/dL (ref 87–352)
IgG (Immunoglobin G), Serum: 688 mg/dL (ref 586–1602)
IgM (Immunoglobulin M), Srm: 68 mg/dL (ref 26–217)

## 2020-05-15 LAB — MITOCHONDRIAL/SMOOTH MUSCLE AB PNL
Mitochondrial Ab: <20 Units (ref 0.0–20.0)
Smooth Muscle Ab: 3 Units (ref 0–19)

## 2020-05-15 LAB — HEPATITIS B SURFACE ANTIGEN: Hepatitis B Surface Ag: NEGATIVE

## 2020-05-15 LAB — TISSUE TRANSGLUTAMINASE, IGA: Transglutaminase IgA: <2 U/mL (ref 0–3)

## 2020-05-30 ENCOUNTER — Telehealth: Payer: Self-pay | Admitting: Gastroenterology

## 2020-05-30 NOTE — Telephone Encounter (Signed)
Called pt. No Vm, not able to leave a message. Will call pt back.

## 2020-05-30 NOTE — Telephone Encounter (Signed)
Received notification that patient had not read the mychart message I sent regarding her results. Please inform patient of results as per my result note (labs from 05/14/20).

## 2020-06-01 NOTE — Telephone Encounter (Signed)
Lmom, waiting on a return call.  

## 2020-06-04 NOTE — Telephone Encounter (Signed)
Letter mailed to pt.  

## 2020-06-19 ENCOUNTER — Encounter: Payer: Self-pay | Admitting: Family

## 2020-06-19 ENCOUNTER — Ambulatory Visit: Payer: BC Managed Care – PPO | Admitting: Family

## 2020-06-19 ENCOUNTER — Other Ambulatory Visit: Payer: Self-pay

## 2020-06-19 VITALS — BP 126/81 | HR 82 | Temp 97.6°F | Ht 64.0 in | Wt 165.6 lb

## 2020-06-19 DIAGNOSIS — R945 Abnormal results of liver function studies: Secondary | ICD-10-CM

## 2020-06-19 DIAGNOSIS — I10 Essential (primary) hypertension: Secondary | ICD-10-CM | POA: Diagnosis not present

## 2020-06-19 DIAGNOSIS — E1169 Type 2 diabetes mellitus with other specified complication: Secondary | ICD-10-CM | POA: Diagnosis not present

## 2020-06-19 DIAGNOSIS — F32A Depression, unspecified: Secondary | ICD-10-CM

## 2020-06-19 DIAGNOSIS — E1129 Type 2 diabetes mellitus with other diabetic kidney complication: Secondary | ICD-10-CM | POA: Diagnosis not present

## 2020-06-19 DIAGNOSIS — R7989 Other specified abnormal findings of blood chemistry: Secondary | ICD-10-CM

## 2020-06-19 DIAGNOSIS — M797 Fibromyalgia: Secondary | ICD-10-CM

## 2020-06-19 DIAGNOSIS — E785 Hyperlipidemia, unspecified: Secondary | ICD-10-CM

## 2020-06-19 DIAGNOSIS — F411 Generalized anxiety disorder: Secondary | ICD-10-CM

## 2020-06-19 DIAGNOSIS — K760 Fatty (change of) liver, not elsewhere classified: Secondary | ICD-10-CM

## 2020-06-19 DIAGNOSIS — R809 Proteinuria, unspecified: Secondary | ICD-10-CM

## 2020-06-19 LAB — BAYER DCA HB A1C WAIVED: HB A1C (BAYER DCA - WAIVED): 7.8 % — ABNORMAL HIGH (ref <7.0)

## 2020-06-19 MED ORDER — PROPRANOLOL HCL 20 MG PO TABS: 20.0000 mg | ORAL_TABLET | Freq: Two times a day (BID) | ORAL | 0 refills | Status: DC

## 2020-06-19 MED ORDER — QUETIAPINE FUMARATE 50 MG PO TABS: 50.0000 mg | ORAL_TABLET | Freq: Every day | ORAL | 3 refills | Status: DC

## 2020-06-19 MED ORDER — TRULICITY 1.5 MG/0.5ML ~~LOC~~ SOAJ: SUBCUTANEOUS | 0 refills | Status: DC

## 2020-06-19 MED ORDER — ATORVASTATIN CALCIUM 10 MG PO TABS: 10.0000 mg | ORAL_TABLET | Freq: Every day | ORAL | 0 refills | Status: DC

## 2020-06-19 MED ORDER — LISINOPRIL 2.5 MG PO TABS: 2.5000 mg | ORAL_TABLET | Freq: Every day | ORAL | 3 refills | Status: DC

## 2020-06-19 MED ORDER — XIGDUO XR 5-1000 MG PO TB24: 2.0000 | ORAL_TABLET | ORAL | 3 refills | Status: DC

## 2020-06-19 MED ORDER — SERTRALINE HCL 50 MG PO TABS: 50.0000 mg | ORAL_TABLET | Freq: Every day | ORAL | 1 refills | Status: DC

## 2020-06-19 NOTE — Progress Notes (Signed)
Subjective:    Patient ID: Peggy Hall, female    DOB: 01/07/1961, 59 y.o.   MRN: [id number]  Chief Complaint  Patient presents with  . Medical Management of Chronic Issues  . Diabetes    Pt presents to the office today to establish care with me. She is followed by Rheumatologists BID for Fibromyalgia. She was started on Etodolac 539m BID that is working well at this time.  She is followed by GI for hepatic steatosis.  Diabetes She presents for her follow-up diabetic visit. She has type 2 diabetes mellitus. Her disease course has been stable. Hypoglycemia symptoms include nervousness/anxiousness. Pertinent negatives for diabetes include no blurred vision and no foot paresthesias. Symptoms are stable. Pertinent negatives for diabetic complications include no CVA, heart disease or peripheral neuropathy. Risk factors for coronary artery disease include dyslipidemia, diabetes mellitus, post-menopausal and sedentary lifestyle. (Not checking BS at home) An ACE inhibitor/angiotensin II receptor blocker is being taken. Eye exam is not current.  Hypertension This is a chronic problem. The current episode started more than 1 year ago. The problem has been resolved since onset. The problem is controlled. Associated symptoms include anxiety and malaise/fatigue. Pertinent negatives include no blurred vision, peripheral edema or shortness of breath. Risk factors for coronary artery disease include dyslipidemia, diabetes mellitus and sedentary lifestyle. The current treatment provides moderate improvement. There is no history of CVA.  Hyperlipidemia This is a chronic problem. The current episode started more than 1 year ago. Pertinent negatives include no shortness of breath. Current antihyperlipidemic treatment includes statins. The current treatment provides moderate improvement of lipids.  Anxiety Presents for follow-up visit. Symptoms include excessive worry, irritability, nervous/anxious behavior and  restlessness. Patient reports no shortness of breath. Symptoms occur most days. The severity of symptoms is moderate.    Depression        This is a chronic problem.  The current episode started more than 1 year ago.   The onset quality is gradual.   The problem occurs intermittently.  Associated symptoms include restlessness.  Associated symptoms include no helplessness, no hopelessness and not sad.  Past medical history includes anxiety.       Review of Systems  Constitutional: Positive for irritability and malaise/fatigue.  Eyes: Negative for blurred vision.  Respiratory: Negative for shortness of breath.   Psychiatric/Behavioral: Positive for depression. The patient is nervous/anxious.   All other systems reviewed and are negative.      Objective:   Physical Exam Vitals reviewed.  Constitutional:      General: She is not in acute distress.    Appearance: She is well-developed and well-nourished.  HENT:     Head: Normocephalic and atraumatic.     Right Ear: Tympanic membrane normal.     Left Ear: Tympanic membrane normal.     Mouth/Throat:     Mouth: Oropharynx is clear and moist.  Eyes:     Pupils: Pupils are equal, round, and reactive to light.  Neck:     Thyroid: No thyromegaly.  Cardiovascular:     Rate and Rhythm: Normal rate and regular rhythm.     Pulses: Intact distal pulses.     Heart sounds: Normal heart sounds. No murmur heard.   Pulmonary:     Effort: Pulmonary effort is normal. No respiratory distress.     Breath sounds: Normal breath sounds. No wheezing.  Abdominal:     General: Bowel sounds are normal. There is no distension.     Palpations: Abdomen is  soft.     Tenderness: There is no abdominal tenderness.  Musculoskeletal:        General: No tenderness or edema. Normal range of motion.     Cervical back: Normal range of motion and neck supple.  Skin:    General: Skin is warm and dry.  Neurological:     Mental Status: She is alert and oriented  to person, place, and time.     Cranial Nerves: No cranial nerve deficit.     Deep Tendon Reflexes: Reflexes are normal and symmetric.  Psychiatric:        Mood and Affect: Mood and affect normal.        Behavior: Behavior normal.        Thought Content: Thought content normal.        Judgment: Judgment normal.          BP 126/81   Pulse 82   Temp 97.6 F (36.4 C) (Temporal)   Ht 5' 4"  (1.626 m)   Wt 165 lb 9.6 oz (75.1 kg)   BMI 28.43 kg/m   Assessment & Plan:  Peggy Hall comes in today with chief complaint of Medical Management of Chronic Issues and Diabetes   Diagnosis and orders addressed:  1. Depression, unspecified depression type - QUEtiapine (SEROQUEL) 50 MG tablet; Take 1 tablet (50 mg total) by mouth at bedtime.  Dispense: 90 tablet; Refill: 3 - CMP14+EGFR  2. Hyperlipidemia associated with type 2 diabetes mellitus (HCC) - atorvastatin (LIPITOR) 10 MG tablet; Take 1 tablet (10 mg total) by mouth daily.  Dispense: 90 tablet; Refill: 0 - CMP14+EGFR  3. Type 2 diabetes mellitus with microalbuminuria, without long-term current use of insulin (HCC) - Dulaglutide (TRULICITY) 1.5 ML/4.6TK SOPN; INJECT 1 DOSE SUBCUTANEOUSLY ONCE A WEEK  Dispense: 12 mL; Refill: 0 - lisinopril (ZESTRIL) 2.5 MG tablet; Take 1 tablet (2.5 mg total) by mouth daily.  Dispense: 90 tablet; Refill: 3 - Dapagliflozin-metFORMIN HCl ER (XIGDUO XR) 10-998 MG TB24; Take 2 tablets by mouth every morning.  Dispense: 180 tablet; Refill: 3 - Bayer DCA Hb A1c Waived - CMP14+EGFR  4. Essential hypertension - CMP14+EGFR  5. Hepatic steatosis - CMP14+EGFR  6. Fibromyalgia - CMP14+EGFR  7. GAD (generalized anxiety disorder) - CMP14+EGFR  8. Abnormal LFTs - CMP14+EGFR   Labs pending Health Maintenance reviewed Diet and exercise encouraged  Follow up plan: 3 months    Peggy Dun, FNP

## 2020-06-19 NOTE — Patient Instructions (Signed)

## 2020-06-20 LAB — CMP14+EGFR
ALT: 77 IU/L — ABNORMAL HIGH (ref 0–32)
AST: 36 IU/L (ref 0–40)
Albumin/Globulin Ratio: 2 (ref 1.2–2.2)
Albumin: 4.5 g/dL (ref 3.8–4.9)
Alkaline Phosphatase: 107 IU/L (ref 44–121)
BUN/Creatinine Ratio: 17 (ref 9–23)
BUN: 12 mg/dL (ref 6–24)
Bilirubin Total: 0.3 mg/dL (ref 0.0–1.2)
CO2: 26 mmol/L (ref 20–29)
Calcium: 9.7 mg/dL (ref 8.7–10.2)
Chloride: 103 mmol/L (ref 96–106)
Creatinine, Ser: 0.69 mg/dL (ref 0.57–1.00)
GFR calc Af Amer: 111 mL/min/{1.73_m2} (ref >59)
GFR calc non Af Amer: 96 mL/min/{1.73_m2} (ref >59)
Globulin, Total: 2.2 g/dL (ref 1.5–4.5)
Glucose: 220 mg/dL — ABNORMAL HIGH (ref 65–99)
Potassium: 4.6 mmol/L (ref 3.5–5.2)
Sodium: 143 mmol/L (ref 134–144)
Total Protein: 6.7 g/dL (ref 6.0–8.5)

## 2020-06-25 ENCOUNTER — Telehealth: Payer: Self-pay | Admitting: Family

## 2020-06-25 NOTE — Telephone Encounter (Signed)
Pt called back about labs, said that it is okay to leave vm if she does not answer [phone]

## 2020-06-25 NOTE — Telephone Encounter (Signed)
Patient aware and verbalized understanding. °

## 2020-08-07 DIAGNOSIS — Z029 Encounter for administrative examinations, unspecified: Secondary | ICD-10-CM

## 2020-08-09 ENCOUNTER — Other Ambulatory Visit: Payer: Self-pay | Admitting: Family

## 2020-08-09 DIAGNOSIS — E785 Hyperlipidemia, unspecified: Secondary | ICD-10-CM

## 2020-08-09 DIAGNOSIS — E1169 Type 2 diabetes mellitus with other specified complication: Secondary | ICD-10-CM

## 2020-08-10 DIAGNOSIS — Z791 Long term (current) use of non-steroidal anti-inflammatories (NSAID): Secondary | ICD-10-CM | POA: Diagnosis not present

## 2020-08-10 DIAGNOSIS — M797 Fibromyalgia: Secondary | ICD-10-CM | POA: Diagnosis not present

## 2020-08-10 DIAGNOSIS — M150 Primary generalized (osteo)arthritis: Secondary | ICD-10-CM | POA: Diagnosis not present

## 2020-08-13 ENCOUNTER — Other Ambulatory Visit: Payer: Self-pay | Admitting: Family

## 2020-08-14 ENCOUNTER — Telehealth: Payer: Self-pay | Admitting: Family Medicine

## 2020-08-22 NOTE — Telephone Encounter (Signed)
Forms filled out.

## 2020-08-30 ENCOUNTER — Other Ambulatory Visit: Payer: Self-pay | Admitting: Family

## 2020-09-12 ENCOUNTER — Other Ambulatory Visit: Payer: Self-pay

## 2020-09-12 ENCOUNTER — Ambulatory Visit: Payer: BC Managed Care – PPO | Admitting: Gastroenterology

## 2020-09-12 ENCOUNTER — Encounter: Payer: Self-pay | Admitting: Gastroenterology

## 2020-09-12 VITALS — BP 137/82 | HR 84 | Temp 97.7°F | Ht 64.0 in | Wt 167.6 lb

## 2020-09-12 DIAGNOSIS — K760 Fatty (change of) liver, not elsewhere classified: Secondary | ICD-10-CM | POA: Diagnosis not present

## 2020-09-12 DIAGNOSIS — Z86010 Personal history of colonic polyps: Secondary | ICD-10-CM | POA: Diagnosis not present

## 2020-09-12 DIAGNOSIS — K529 Noninfective gastroenteritis and colitis, unspecified: Secondary | ICD-10-CM

## 2020-09-12 DIAGNOSIS — R7989 Other specified abnormal findings of blood chemistry: Secondary | ICD-10-CM

## 2020-09-12 DIAGNOSIS — R945 Abnormal results of liver function studies: Secondary | ICD-10-CM | POA: Diagnosis not present

## 2020-09-12 MED ORDER — PEG 3350-KCL-NA BICARB-NACL 420 G PO SOLR: 4000.0000 mL | ORAL | 0 refills | Status: DC

## 2020-09-12 NOTE — Progress Notes (Signed)
Primary Care Physician: Sharion Balloon, FNP  Primary Gastroenterologist:  Garfield Cornea, MD   Chief Complaint  Patient presents with  . Follow-up    Fatty liver    HPI: Peggy Hall is a 60 y.o. female here for follow-up.  Last seen September 2021.  History of hepatic steatosis.  RUQ U/S 12/2019: IMPRESSION: Cholelithiasis without sonographic evidence of acute cholecystitis. Diffusely increased hepatic echogenicity and diminished through transmission compatible with moderate to severe hepatic steatosis.  Chronically elevated ALT dating back at least to 2018.  Hepatitis C antibody was negative previously.  Chronically on Lipitor.  Work-up back in November with normal iron studies, negative celiac serologies, hepatitis B surface antigen negative, ANA negative, AMA negative, anti-smooth muscle antibody negative, immunoglobulins normal.  Complains of chronic postprandial loose stools associated with urgency and incontinence at times.  Has had an episode of nocturnal incontinence recently.  Symptoms going on for several years.  No blood in the stool or melena.  No abdominal pain.  No heartburn.  Colonoscopy 2016, adenomatous colon polyps, advised to follow-up in 5 years.   Current Outpatient Medications  Medication Sig Dispense Refill  . atorvastatin (LIPITOR) 10 MG tablet Take 1 tablet by mouth once daily 90 tablet 0  . Dapagliflozin-metFORMIN HCl ER (XIGDUO XR) 10-998 MG TB24 Take 2 tablets by mouth every morning. 180 tablet 3  . Dulaglutide (TRULICITY) 1.5 JY/7.8GN SOPN INJECT 1 DOSE SUBCUTANEOUSLY ONCE A WEEK 12 mL 0  . etodolac (LODINE) 500 MG tablet Take 500 mg by mouth 2 (two) times daily.    Marland Kitchen lisinopril (ZESTRIL) 2.5 MG tablet Take 1 tablet (2.5 mg total) by mouth daily. 90 tablet 3  . propranolol (INDERAL) 20 MG tablet Take 1 tablet (20 mg total) by mouth 2 (two) times daily. 180 tablet 0  . QUEtiapine (SEROQUEL) 50 MG tablet Take 1 tablet (50 mg total) by mouth at  bedtime. 90 tablet 3  . sertraline (ZOLOFT) 50 MG tablet Take 1 tablet by mouth once daily 90 tablet 0  . polyethylene glycol-electrolytes (TRILYTE) 420 g solution Take 4,000 mLs by mouth as directed. 4000 mL 0   No current facility-administered medications for this visit.    Allergies as of 09/12/2020  . (No Known Allergies)   Past Medical History:  Diagnosis Date  . Cancer [facility]) 2006   cervical  . Depression   . Diabetes mellitus without complication ([place])   . Fibromyalgia   . Hepatic steatosis   . Hyperlipidemia   . Hypertension    Past Surgical History:  Procedure Laterality Date  . ABDOMINAL HYSTERECTOMY    . COLONOSCOPY  09/2014   [place]: two adenomatous colon polyps removed. advised 5 year follow up colonoscopy   Family History  Problem Relation Age of Onset  . Stroke Mother   . Heart disease Mother   . Depression Mother   . Dementia Mother   . Stroke Father   . Hypertension Father   . Diabetes Father   . Hyperlipidemia Father   . Peripheral Artery Disease Father   . Bipolar disorder Sister   . Cancer Maternal Grandmother           . Colon cancer Maternal Grandmother   . Liver disease Neg Hx    Social History   Tobacco Use  . Smoking status: Former Smoker    Types: Cigarettes  . Smokeless tobacco: Never Used  Vaping Use  . Vaping Use: Never used  Substance Use Topics  .  Alcohol use: No  . Drug use: No    ROS:  General: Negative for anorexia, weight loss, fever, chills, fatigue, weakness. ENT: Negative for hoarseness, difficulty swallowing , nasal congestion. CV: Negative for chest pain, angina, palpitations, dyspnea on exertion, peripheral edema.  Respiratory: Negative for dyspnea at rest, dyspnea on exertion, cough, sputum, wheezing.  GI: See history of present illness. GU:  Negative for dysuria, hematuria, urinary incontinence, urinary frequency, nocturnal urination.  Endo: Negative for unusual weight change.    Physical  Examination:   BP 137/82   Pulse 84   Temp 97.7 F (36.5 C) (Temporal)   Ht 5\' 4"  (1.626 m)   Wt 167 lb 9.6 oz (76 kg)   BMI 28.77 kg/m   General: Well-nourished, well-developed in no acute distress.  Eyes: No icterus. Mouth: masked Lungs: Clear to auscultation bilaterally.  Heart: Regular rate and rhythm, no murmurs rubs or gallops.  Abdomen: Bowel sounds are normal, nontender, nondistended, no hepatosplenomegaly or masses, no abdominal bruits or hernia , no rebound or guarding.   Extremities: No lower extremity edema. No clubbing or deformities. Neuro: Alert and oriented x 4   Skin: Warm and dry, no jaundice.   Psych: Alert and cooperative, normal mood and affect.  Labs:  Lab Results  Component Value Date   CREATININE 0.69 06/19/2020   BUN 12 06/19/2020   NA 143 06/19/2020   K 4.6 06/19/2020   CL 103 06/19/2020   CO2 26 06/19/2020   Lab Results  Component Value Date   ALT 77 (H) 06/19/2020   AST 36 06/19/2020   ALKPHOS 107 06/19/2020   BILITOT 0.3 06/19/2020   Lab Results  Component Value Date   WBC 9.3 12/19/2019   HGB 14.0 12/19/2019   HCT 41.4 12/19/2019   MCV 91 12/19/2019   PLT 292 12/19/2019   No results found for: LIPASE Lab Results  Component Value Date   IRON 58 05/14/2020   TIBC 328 05/14/2020   FERRITIN 94 05/14/2020    Imaging Studies: No results found.  Assessment:  Pleasant 60 year old female presenting for follow-up of hepatic steatosis noted on previous ultrasound, chronically elevated ALT.  She is also due surveillance colonoscopy for history of adenomatous colon polyps.  Hepatic steatosis/elevated ALT: Patient has a history of chronic diabetes dating back to 2006.  BMI is 28.  Denies alcohol use.  Extensive serologies as outlined above unremarkable.  Suspect NASH in the setting of diabetes/excessive weight.  Cannot rule out drug effect for cause of chronically elevated ALT.    Postprandial loose stools associated with fecal urgency  and sometimes incontinence.  Has been going on for years.  Suspect medication related versus diabetic enteropathy versus IBS, less likely IBD or malignancy.  Cannot exclude microscopic colitis.  History of adenomatous colon polyps, due surveillance colonoscopy at this time.  Plan:  1. For hepatic steatosis: Encourage tight glycemic control.  Consider 10 pound weight loss.  Strive for physical activity daily.  Continue to follow-up in 6 months with labs.  We will follow-up next set of labs done by PCP when available 2. For chronic postprandial loose stools: Consider random colon biopsies at time of upcoming colonoscopy. 3. For history of adenomatous colon polyps: Colonoscopy with Dr. Gala Romney with propofol due to polypharmacy.  ASA II.  I have discussed the risks, alternatives, benefits with regards to but not limited to the risk of reaction to medication, bleeding, infection, perforation and the patient is agreeable to proceed. Written consent to be  obtained. 4. Follow-up in 1 year.

## 2020-09-12 NOTE — Patient Instructions (Signed)
1. Continue to have liver blood work done every 6 months.  I will follow-up on your next set done by PCP when available. 2. Colonoscopy as scheduled.  Please see separate instructions.

## 2020-09-13 ENCOUNTER — Telehealth: Payer: Self-pay | Admitting: Internal Medicine

## 2020-09-13 ENCOUNTER — Encounter: Payer: Self-pay | Admitting: *Deleted

## 2020-09-13 NOTE — Telephone Encounter (Signed)
Pt LMOM for [place] to cancel her colonoscopy with Dr Gala Romney on 10/25/2020

## 2020-09-13 NOTE — Telephone Encounter (Signed)
Spoke with pt. She states she called and made an appt with [facility] (she used to see) and will have them do her procedure. FYI to leslie

## 2020-09-13 NOTE — Telephone Encounter (Signed)
Called pt, went straight to VM unable to leave message

## 2020-09-13 NOTE — Telephone Encounter (Signed)
Called pt, no answer, VM full unable to leave message

## 2020-09-14 ENCOUNTER — Encounter: Payer: Self-pay | Admitting: Gastroenterology

## 2020-09-14 NOTE — Telephone Encounter (Signed)
Called pt, no answer and unable to leave VM

## 2020-09-14 NOTE — Telephone Encounter (Signed)
Noted, please find out if she is transferring her care to [facility] for her fatty liver too? She will need to keep her GI care with one provider.   FYI Reba.

## 2020-09-17 NOTE — Telephone Encounter (Signed)
If still unable to confirm via phone---then I think we need to send a letter to try to make contact and confirm her intentions.   Let me know and I will send her a letter for practice.

## 2020-09-17 NOTE — Telephone Encounter (Signed)
Noted thanks °

## 2020-09-17 NOTE — Telephone Encounter (Signed)
Called pt and she states yes she will be transferring out for everything. Nothing against the office but she works in [employer] and it's easier for her to take off work to go to them instead of coming here. FYI to leslie

## 2020-09-17 NOTE — Progress Notes (Signed)
Cc'ed to pcp °

## 2020-09-17 NOTE — Telephone Encounter (Signed)
Called pt, went straight to VM. LMOVM

## 2020-09-18 ENCOUNTER — Ambulatory Visit: Payer: BC Managed Care – PPO | Admitting: Family

## 2020-09-26 ENCOUNTER — Other Ambulatory Visit: Payer: Self-pay

## 2020-09-26 ENCOUNTER — Encounter: Payer: Self-pay | Admitting: Family

## 2020-09-26 ENCOUNTER — Ambulatory Visit: Payer: BC Managed Care – PPO | Admitting: Family

## 2020-09-26 VITALS — BP 127/78 | HR 79 | Temp 98.2°F | Ht 64.0 in | Wt 162.0 lb

## 2020-09-26 DIAGNOSIS — E1129 Type 2 diabetes mellitus with other diabetic kidney complication: Secondary | ICD-10-CM | POA: Diagnosis not present

## 2020-09-26 DIAGNOSIS — F411 Generalized anxiety disorder: Secondary | ICD-10-CM

## 2020-09-26 DIAGNOSIS — I10 Essential (primary) hypertension: Secondary | ICD-10-CM

## 2020-09-26 DIAGNOSIS — R809 Proteinuria, unspecified: Secondary | ICD-10-CM

## 2020-09-26 DIAGNOSIS — E1169 Type 2 diabetes mellitus with other specified complication: Secondary | ICD-10-CM | POA: Diagnosis not present

## 2020-09-26 DIAGNOSIS — M797 Fibromyalgia: Secondary | ICD-10-CM

## 2020-09-26 DIAGNOSIS — K760 Fatty (change of) liver, not elsewhere classified: Secondary | ICD-10-CM

## 2020-09-26 DIAGNOSIS — E785 Hyperlipidemia, unspecified: Secondary | ICD-10-CM

## 2020-09-26 DIAGNOSIS — F32A Depression, unspecified: Secondary | ICD-10-CM

## 2020-09-26 DIAGNOSIS — R5383 Other fatigue: Secondary | ICD-10-CM

## 2020-09-26 LAB — BAYER DCA HB A1C WAIVED: HB A1C (BAYER DCA - WAIVED): 7.8 % — ABNORMAL HIGH (ref <7.0)

## 2020-09-26 MED ORDER — TRULICITY 1.5 MG/0.5ML ~~LOC~~ SOAJ: SUBCUTANEOUS | 6 refills | Status: DC

## 2020-09-26 NOTE — Patient Instructions (Signed)
Diabetes Mellitus and Nutrition, Adult When you have diabetes, or diabetes mellitus, it is very important to have healthy eating habits because your blood sugar (glucose) levels are greatly affected by what you eat and drink. Eating healthy foods in the right amounts, at about the same times every day, can help you:  Control your blood glucose.  Lower your risk of heart disease.  Improve your blood pressure.  Reach or maintain a healthy weight. What can affect my meal plan? Every person with diabetes is different, and each person has different needs for a meal plan. Your health care provider may recommend that you work with a dietitian to make a meal plan that is best for you. Your meal plan may vary depending on factors such as:  The calories you need.  The medicines you take.  Your weight.  Your blood glucose, blood pressure, and cholesterol levels.  Your activity level.  Other health conditions you have, such as heart or kidney disease. How do carbohydrates affect me? Carbohydrates, also called carbs, affect your blood glucose level more than any other type of food. Eating carbs naturally raises the amount of glucose in your blood. Carb counting is a method for keeping track of how many carbs you eat. Counting carbs is important to keep your blood glucose at a healthy level, especially if you use insulin or take certain oral diabetes medicines. It is important to know how many carbs you can safely have in each meal. This is different for every person. Your dietitian can help you calculate how many carbs you should have at each meal and for each snack. How does alcohol affect me? Alcohol can cause a sudden decrease in blood glucose (hypoglycemia), especially if you use insulin or take certain oral diabetes medicines. Hypoglycemia can be a life-threatening condition. Symptoms of hypoglycemia, such as sleepiness, dizziness, and confusion, are similar to symptoms of having too much  alcohol.  Do not drink alcohol if: ? Your health care provider tells you not to drink. ? You are pregnant, may be pregnant, or are planning to become pregnant.  If you drink alcohol: ? Do not drink on an empty stomach. ? Limit how much you use to:  0-1 drink a day for women.  0-2 drinks a day for men. ? Be aware of how much alcohol is in your drink. In the [place] one drink equals one 12 oz bottle of beer (355 mL), one 5 oz glass of wine (148 mL), or one 1 oz glass of hard liquor (44 mL). ? Keep yourself hydrated with water, diet soda, or unsweetened iced tea.  Keep in mind that regular soda, juice, and other mixers may contain a lot of sugar and must be counted as carbs. What are tips for following this plan? Reading food labels  Start by checking the serving size on the "Nutrition Facts" label of packaged foods and drinks. The amount of calories, carbs, fats, and other nutrients listed on the label is based on one serving of the item. Many items contain more than one serving per package.  Check the total grams (g) of carbs in one serving. You can calculate the number of servings of carbs in one serving by dividing the total carbs by 15. For example, if a food has 30 g of total carbs per serving, it would be equal to 2 servings of carbs.  Check the number of grams (g) of saturated fats and trans fats in one serving. Choose foods that have   a low amount or none of these fats.  Check the number of milligrams (mg) of salt (sodium) in one serving. Most people should limit total sodium intake to less than 2,300 mg per day.  Always check the nutrition information of foods labeled as "low-fat" or "nonfat." These foods may be higher in added sugar or refined carbs and should be avoided.  Talk to your dietitian to identify your daily goals for nutrients listed on the label. Shopping  Avoid buying canned, pre-made, or processed foods. These foods tend to be high in fat, sodium, and added  sugar.  Shop around the outside edge of the grocery store. This is where you will most often find fresh fruits and vegetables, bulk grains, fresh meats, and fresh dairy. Cooking  Use low-heat cooking methods, such as baking, instead of high-heat cooking methods like deep frying.  Cook using healthy oils, such as olive, canola, or sunflower oil.  Avoid cooking with butter, cream, or high-fat meats. Meal planning  Eat meals and snacks regularly, preferably at the same times every day. Avoid going long periods of time without eating.  Eat foods that are high in fiber, such as fresh fruits, vegetables, beans, and whole grains. Talk with your dietitian about how many servings of carbs you can eat at each meal.  Eat 4-6 oz (112-168 g) of lean protein each day, such as lean meat, chicken, fish, eggs, or tofu. One ounce (oz) of lean protein is equal to: ? 1 oz (28 g) of meat, chicken, or fish. ? 1 egg. ?  cup (62 g) of tofu.  Eat some foods each day that contain healthy fats, such as avocado, nuts, seeds, and fish.   What foods should I eat? Fruits Berries. Apples. Oranges. Peaches. Apricots. Plums. Grapes. Mango. Papaya. Pomegranate. Kiwi. Cherries. Vegetables Lettuce. Spinach. Leafy greens, including kale, chard, collard greens, and mustard greens. Beets. Cauliflower. Cabbage. Broccoli. Carrots. Green beans. Tomatoes. Peppers. Onions. Cucumbers. Brussels sprouts. Grains Whole grains, such as whole-wheat or whole-grain bread, crackers, tortillas, cereal, and pasta. Unsweetened oatmeal. Quinoa. Brown or wild rice. Meats and other proteins Seafood. Poultry without skin. Lean cuts of poultry and beef. Tofu. Nuts. Seeds. Dairy Low-fat or fat-free dairy products such as milk, yogurt, and cheese. The items listed above may not be a complete list of foods and beverages you can eat. Contact a dietitian for more information. What foods should I avoid? Fruits Fruits canned with  syrup. Vegetables Canned vegetables. Frozen vegetables with butter or cream sauce. Grains Refined white flour and flour products such as bread, pasta, snack foods, and cereals. Avoid all processed foods. Meats and other proteins Fatty cuts of meat. Poultry with skin. Breaded or fried meats. Processed meat. Avoid saturated fats. Dairy Full-fat yogurt, cheese, or milk. Beverages Sweetened drinks, such as soda or iced tea. The items listed above may not be a complete list of foods and beverages you should avoid. Contact a dietitian for more information. Questions to ask a health care provider  Do I need to meet with a diabetes educator?  Do I need to meet with a dietitian?  What number can I call if I have questions?  When are the best times to check my blood glucose? Where to find more information:  American Diabetes Association: diabetes.org  Academy of Nutrition and Dietetics: www.eatright.org  National Institute of Diabetes and Digestive and Kidney Diseases: www.niddk.nih.gov  Association of Diabetes Care and Education Specialists: www.diabeteseducator.org Summary  It is important to have healthy eating   habits because your blood sugar (glucose) levels are greatly affected by what you eat and drink.  A healthy meal plan will help you control your blood glucose and maintain a healthy lifestyle.  Your health care provider may recommend that you work with a dietitian to make a meal plan that is best for you.  Keep in mind that carbohydrates (carbs) and alcohol have immediate effects on your blood glucose levels. It is important to count carbs and to use alcohol carefully. This information is not intended to replace advice given to you by your health care provider. Make sure you discuss any questions you have with your health care provider. Document Revised: 05/17/2019 Document Reviewed: 05/17/2019 Elsevier Patient Education  2021 [employer].  

## 2020-09-26 NOTE — Progress Notes (Signed)
Subjective:    Patient ID: Peggy Hall, female    DOB: 04/26/1961, 60 y.o.   MRN: [id number]  Chief Complaint  Patient presents with  . Hypertension  . Diabetes    3 mths no concerns    Pt presents to the office today for chronic follow up. She is followed by Rheumatologists 3 months for Fibromyalgia. She was started on Etodolac 556m BID that is working well at this time. She is complaining of fatigue that has been on going for several months.   She is followed by GI for hepatic steatosis.  Hypertension This is a chronic problem. The current episode started more than 1 year ago. The problem has been resolved since onset. Associated symptoms include anxiety. Pertinent negatives include no blurred vision, malaise/fatigue, peripheral edema or shortness of breath. Risk factors for coronary artery disease include dyslipidemia and diabetes mellitus. The current treatment provides moderate improvement. There is no history of kidney disease or CVA.  Diabetes She presents for her follow-up diabetic visit. She has type 2 diabetes mellitus. Her disease course has been stable. Hypoglycemia symptoms include nervousness/anxiousness. Pertinent negatives for diabetes include no blurred vision and no foot paresthesias. There are no hypoglycemic complications. Pertinent negatives for diabetic complications include no CVA. Risk factors for coronary artery disease include hypertension, sedentary lifestyle, post-menopausal, diabetes mellitus and dyslipidemia. She is following a generally unhealthy diet. Her overall blood glucose range is 140-180 mg/dl. An ACE inhibitor/angiotensin II receptor blocker is being taken. Eye exam is not current.  Hyperlipidemia This is a chronic problem. The current episode started more than 1 year ago. The problem is controlled. Pertinent negatives include no shortness of breath. Current antihyperlipidemic treatment includes statins. The current treatment provides moderate improvement  of lipids. Risk factors for coronary artery disease include dyslipidemia, diabetes mellitus, hypertension, a sedentary lifestyle and post-menopausal.  Depression        This is a chronic problem.  The current episode started more than 1 year ago.   The onset quality is gradual.   The problem occurs intermittently.  Associated symptoms include irritable.  Associated symptoms include no helplessness, no hopelessness and not sad.  Past treatments include SSRIs - Selective serotonin reuptake inhibitors.  Compliance with treatment is good.  Past medical history includes anxiety.   Anxiety Presents for follow-up visit. Symptoms include excessive worry, irritability and nervous/anxious behavior. Patient reports no shortness of breath. Symptoms occur most days. The severity of symptoms is moderate. The quality of sleep is good.        Review of Systems  Constitutional: Positive for irritability. Negative for malaise/fatigue.  Eyes: Negative for blurred vision.  Respiratory: Negative for shortness of breath.   Psychiatric/Behavioral: Positive for depression. The patient is nervous/anxious.   All other systems reviewed and are negative.      Objective:   Physical Exam Vitals reviewed.  Constitutional:      General: She is irritable. She is not in acute distress.    Appearance: She is well-developed.  HENT:     Head: Normocephalic and atraumatic.     Right Ear: Tympanic membrane normal.     Left Ear: Tympanic membrane normal.  Eyes:     Pupils: Pupils are equal, round, and reactive to light.  Neck:     Thyroid: No thyromegaly.  Cardiovascular:     Rate and Rhythm: Normal rate and regular rhythm.     Heart sounds: Normal heart sounds. No murmur heard.   Pulmonary:  Effort: Pulmonary effort is normal. No respiratory distress.     Breath sounds: Normal breath sounds. No wheezing.  Abdominal:     General: Bowel sounds are normal. There is no distension.     Palpations: Abdomen is  soft.     Tenderness: There is no abdominal tenderness.  Musculoskeletal:        General: No tenderness. Normal range of motion.     Cervical back: Normal range of motion and neck supple.  Skin:    General: Skin is warm and dry.  Neurological:     Mental Status: She is alert and oriented to person, place, and time.     Cranial Nerves: No cranial nerve deficit.     Deep Tendon Reflexes: Reflexes are normal and symmetric.  Psychiatric:        Behavior: Behavior normal.        Thought Content: Thought content normal.        Judgment: Judgment normal.    Diabetic Foot Exam - Simple   Simple Foot Form Diabetic Foot exam was performed with the following findings: Yes 09/26/2020  9:26 AM  Visual Inspection No deformities, no ulcerations, no other skin breakdown bilaterally: Yes Sensation Testing Intact to touch and monofilament testing bilaterally: Yes Pulse Check Posterior Tibialis and Dorsalis pulse intact bilaterally: Yes Comments       BP 127/78   Pulse 79   Temp 98.2 F (36.8 C) (Temporal)   Ht _0  (1.626 m)   Wt 162 lb (73.5 kg)   BMI 27.81 kg/m      Assessment & Plan:  Peggy Hall comes in today with chief complaint of Hypertension and Diabetes (3 mths no concerns/)   Diagnosis and orders addressed:  1. Type 2 diabetes mellitus with microalbuminuria, without long-term current use of insulin (HCC) - Bayer DCA Hb A1c Waived - Dulaglutide (TRULICITY) 1.5 ZO/1.0RU SOPN; INJECT 1 DOSE SUBCUTANEOUSLY ONCE A WEEK  Dispense: 12 mL; Refill: 6 - CMP14+EGFR - CBC with Differential/Platelet - Microalbumin / creatinine urine ratio  2. Essential hypertension - CMP14+EGFR - CBC with Differential/Platelet  3. Hepatic steatosis - CMP14+EGFR - CBC with Differential/Platelet  4. Hyperlipidemia associated with type 2 diabetes mellitus (HCC) - CMP14+EGFR - CBC with Differential/Platelet  5. Depression, unspecified depression type - CMP14+EGFR - CBC with  Differential/Platelet  6. Fibromyalgia  - CMP14+EGFR - CBC with Differential/Platelet  7. Hyperlipidemia, unspecified hyperlipidemia type - CMP14+EGFR - CBC with Differential/Platelet  8. GAD (generalized anxiety disorder) - CMP14+EGFR - CBC with Differential/Platelet  9. Fatigue, unspecified type - CMP14+EGFR - CBC with Differential/Platelet - TSH   Labs pending Health Maintenance reviewed Diet and exercise encouraged  Follow up plan: 4 months    Evelina Dun, FNP

## 2020-09-27 ENCOUNTER — Other Ambulatory Visit: Payer: Self-pay | Admitting: Family

## 2020-09-27 DIAGNOSIS — E875 Hyperkalemia: Secondary | ICD-10-CM

## 2020-09-27 LAB — CBC WITH DIFFERENTIAL/PLATELET
Basophils Absolute: 0 10*3/uL (ref 0.0–0.2)
Basos: 1 %
EOS (ABSOLUTE): 0.1 10*3/uL (ref 0.0–0.4)
Eos: 2 %
Hematocrit: 42.8 % (ref 34.0–46.6)
Hemoglobin: 14.6 g/dL (ref 11.1–15.9)
Immature Grans (Abs): 0.1 10*3/uL (ref 0.0–0.1)
Immature Granulocytes: 1 %
Lymphocytes Absolute: 1.6 10*3/uL (ref 0.7–3.1)
Lymphs: 22 %
MCH: 30.8 pg (ref 26.6–33.0)
MCHC: 34.1 g/dL (ref 31.5–35.7)
MCV: 90 fL (ref 79–97)
Monocytes Absolute: 0.6 10*3/uL (ref 0.1–0.9)
Monocytes: 8 %
Neutrophils Absolute: 5.1 10*3/uL (ref 1.4–7.0)
Neutrophils: 66 %
Platelets: 295 10*3/uL (ref 150–450)
RBC: 4.74 x10E6/uL (ref 3.77–5.28)
RDW: 11.9 % (ref 11.7–15.4)
WBC: 7.6 10*3/uL (ref 3.4–10.8)

## 2020-09-27 LAB — CMP14+EGFR
ALT: 86 IU/L — ABNORMAL HIGH (ref 0–32)
AST: 44 IU/L — ABNORMAL HIGH (ref 0–40)
Albumin/Globulin Ratio: 2.5 — ABNORMAL HIGH (ref 1.2–2.2)
Albumin: 4.7 g/dL (ref 3.8–4.9)
Alkaline Phosphatase: 87 IU/L (ref 44–121)
BUN/Creatinine Ratio: 25 — ABNORMAL HIGH (ref 9–23)
BUN: 20 mg/dL (ref 6–24)
Bilirubin Total: 0.6 mg/dL (ref 0.0–1.2)
CO2: 21 mmol/L (ref 20–29)
Calcium: 9.6 mg/dL (ref 8.7–10.2)
Chloride: 102 mmol/L (ref 96–106)
Creatinine, Ser: 0.8 mg/dL (ref 0.57–1.00)
Globulin, Total: 1.9 g/dL (ref 1.5–4.5)
Glucose: 158 mg/dL — ABNORMAL HIGH (ref 65–99)
Potassium: 5.7 mmol/L — ABNORMAL HIGH (ref 3.5–5.2)
Sodium: 140 mmol/L (ref 134–144)
Total Protein: 6.6 g/dL (ref 6.0–8.5)
eGFR: 85 mL/min/{1.73_m2} (ref >59)

## 2020-09-27 LAB — MICROALBUMIN / CREATININE URINE RATIO
Creatinine, Urine: 71.5 mg/dL
Microalb/Creat Ratio: <4 mg/g creat (ref 0–29)
Microalbumin, Urine: <3 ug/mL

## 2020-09-27 LAB — TSH: TSH: 1.68 u[IU]/mL (ref 0.450–4.500)

## 2020-09-28 ENCOUNTER — Other Ambulatory Visit: Payer: Self-pay

## 2020-09-28 ENCOUNTER — Other Ambulatory Visit: Payer: BC Managed Care – PPO

## 2020-09-28 DIAGNOSIS — E875 Hyperkalemia: Secondary | ICD-10-CM | POA: Diagnosis not present

## 2020-09-28 LAB — BMP8+EGFR
BUN/Creatinine Ratio: 25 — ABNORMAL HIGH (ref 9–23)
BUN: 17 mg/dL (ref 6–24)
CO2: 21 mmol/L (ref 20–29)
Calcium: 9.6 mg/dL (ref 8.7–10.2)
Chloride: 104 mmol/L (ref 96–106)
Creatinine, Ser: 0.67 mg/dL (ref 0.57–1.00)
Glucose: 153 mg/dL — ABNORMAL HIGH (ref 65–99)
Potassium: 5.4 mmol/L — ABNORMAL HIGH (ref 3.5–5.2)
Sodium: 139 mmol/L (ref 134–144)
eGFR: 101 mL/min/{1.73_m2} (ref >59)

## 2020-10-08 ENCOUNTER — Other Ambulatory Visit: Payer: Self-pay | Admitting: Family

## 2020-10-23 ENCOUNTER — Other Ambulatory Visit (HOSPITAL_COMMUNITY): Admission: RE | Admit: 2020-10-23 | Payer: BC Managed Care – PPO | Source: Ambulatory Visit

## 2020-10-25 ENCOUNTER — Encounter (HOSPITAL_COMMUNITY): Payer: Self-pay

## 2020-10-25 ENCOUNTER — Ambulatory Visit (HOSPITAL_COMMUNITY): Admit: 2020-10-25 | Payer: BC Managed Care – PPO | Admitting: Internal Medicine

## 2020-10-25 SURGERY — COLONOSCOPY WITH PROPOFOL
Anesthesia: Monitor Anesthesia Care

## 2020-10-30 ENCOUNTER — Encounter: Payer: Self-pay | Admitting: Family Medicine

## 2020-10-31 DIAGNOSIS — Z791 Long term (current) use of non-steroidal anti-inflammatories (NSAID): Secondary | ICD-10-CM | POA: Diagnosis not present

## 2020-10-31 DIAGNOSIS — M797 Fibromyalgia: Secondary | ICD-10-CM | POA: Diagnosis not present

## 2020-10-31 DIAGNOSIS — M150 Primary generalized (osteo)arthritis: Secondary | ICD-10-CM | POA: Diagnosis not present

## 2020-11-21 DIAGNOSIS — Z20828 Contact with and (suspected) exposure to other viral communicable diseases: Secondary | ICD-10-CM | POA: Diagnosis not present

## 2020-11-22 ENCOUNTER — Ambulatory Visit: Payer: BC Managed Care – PPO | Admitting: Family

## 2020-11-26 DIAGNOSIS — Z1211 Encounter for screening for malignant neoplasm of colon: Secondary | ICD-10-CM | POA: Diagnosis not present

## 2020-11-26 DIAGNOSIS — Z86010 Personal history of colonic polyps: Secondary | ICD-10-CM | POA: Diagnosis not present

## 2020-11-26 DIAGNOSIS — K648 Other hemorrhoids: Secondary | ICD-10-CM | POA: Diagnosis not present

## 2020-11-30 DIAGNOSIS — M65322 Trigger finger, left index finger: Secondary | ICD-10-CM | POA: Diagnosis not present

## 2020-11-30 DIAGNOSIS — M79642 Pain in left hand: Secondary | ICD-10-CM | POA: Diagnosis not present

## 2020-11-30 IMAGING — US US ABDOMEN LIMITED
1 series · 14 of 25 positions shown · non-contrast
Comparison: None.

CLINICAL DATA: Elevated LFTs

EXAM:
ULTRASOUND ABDOMEN LIMITED RIGHT UPPER QUADRANT

[Series 1: us abdomen limited · 0.16mm/px · 14 of 65 slices shown]
[im 1/65]
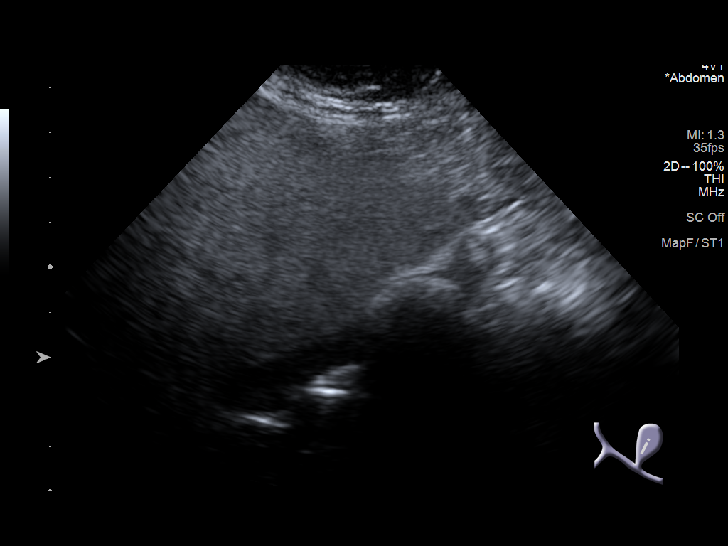
[im 6/65]
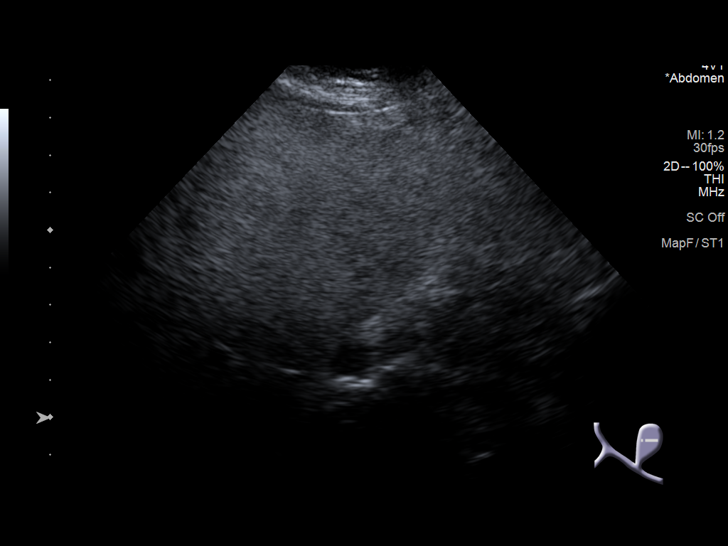
[im 11/65]
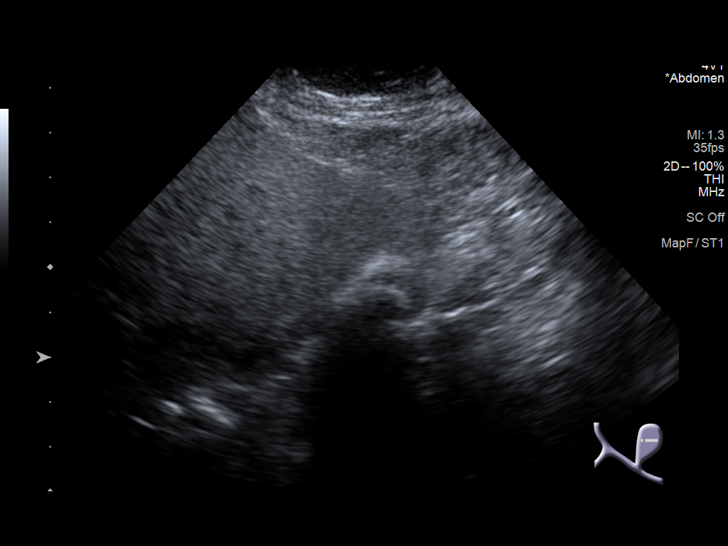
[im 17/65]
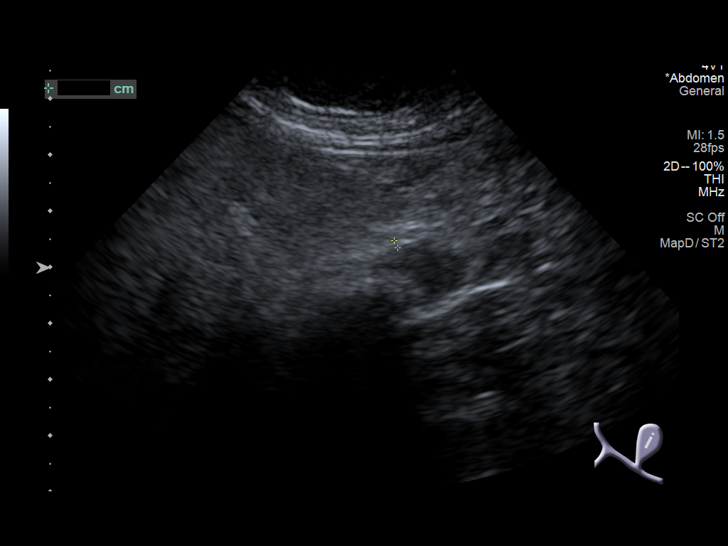
[im 22/65]
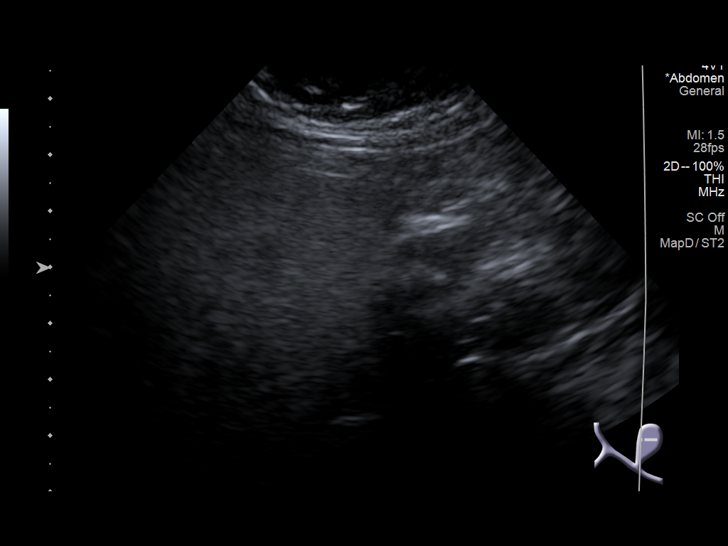
[im 25/65]
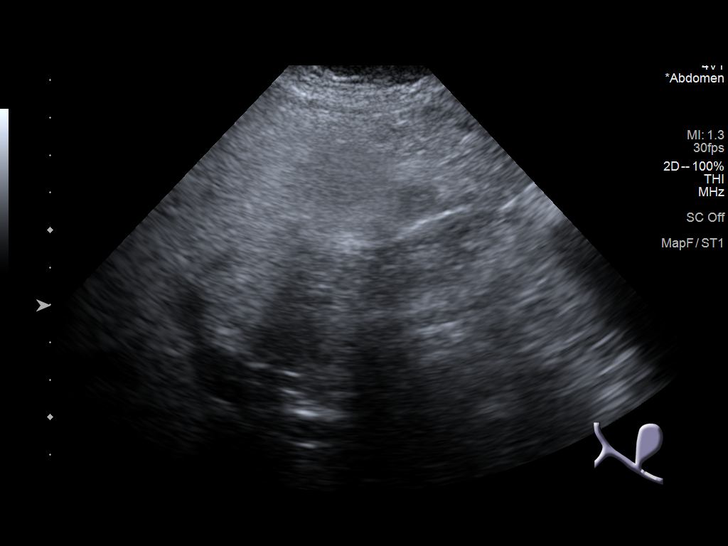
[im 30/65]
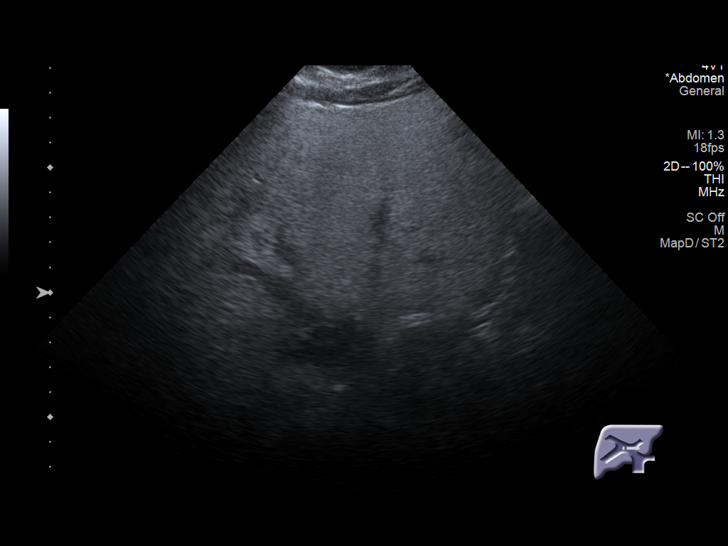
[im 35/65]
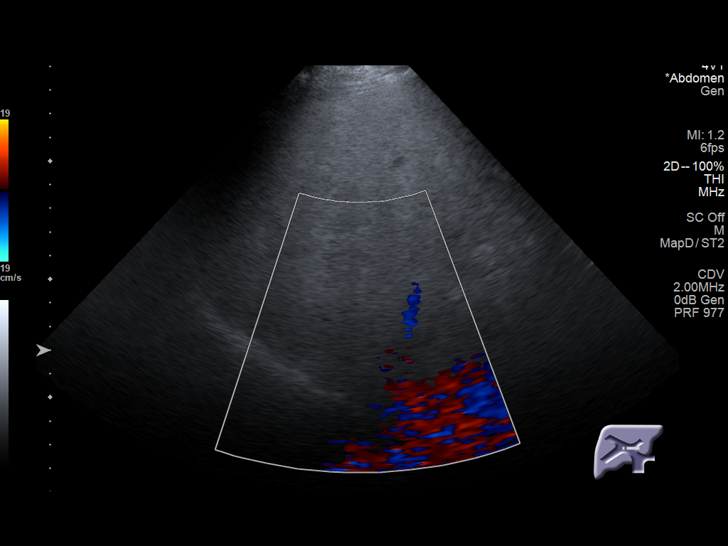
[im 41/65]
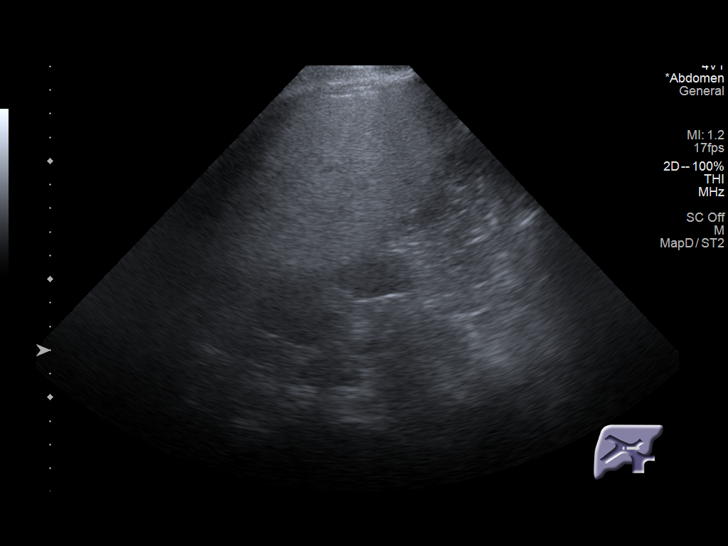
[im 43/65]
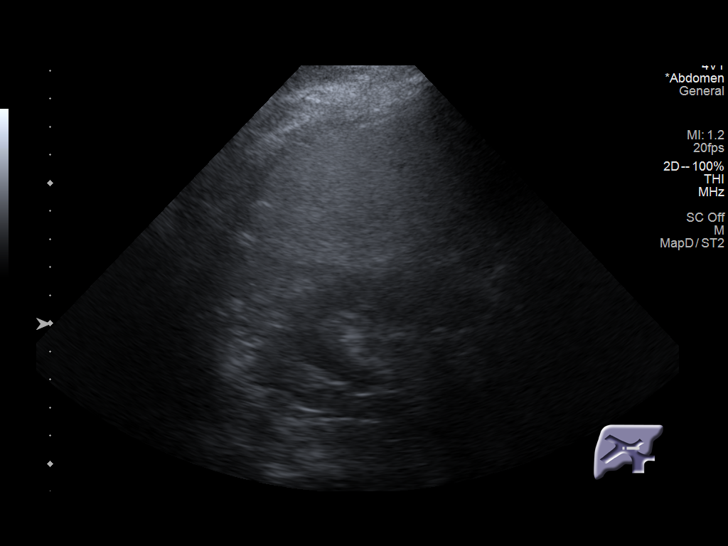
[im 49/65]
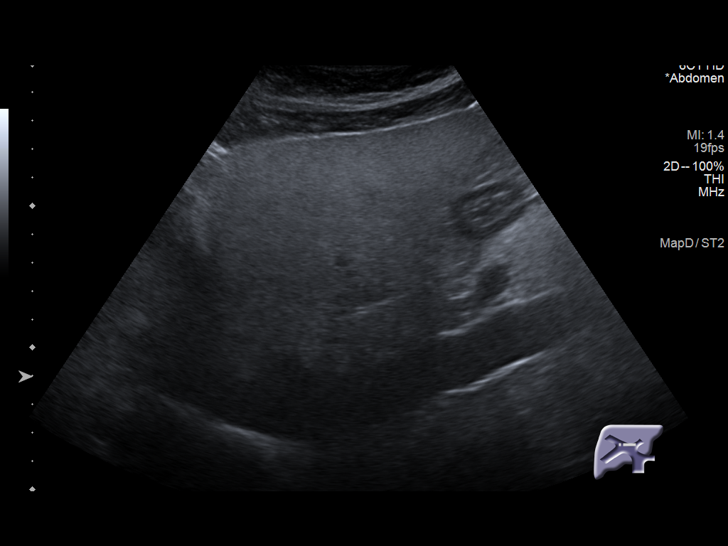
[im 54/65]
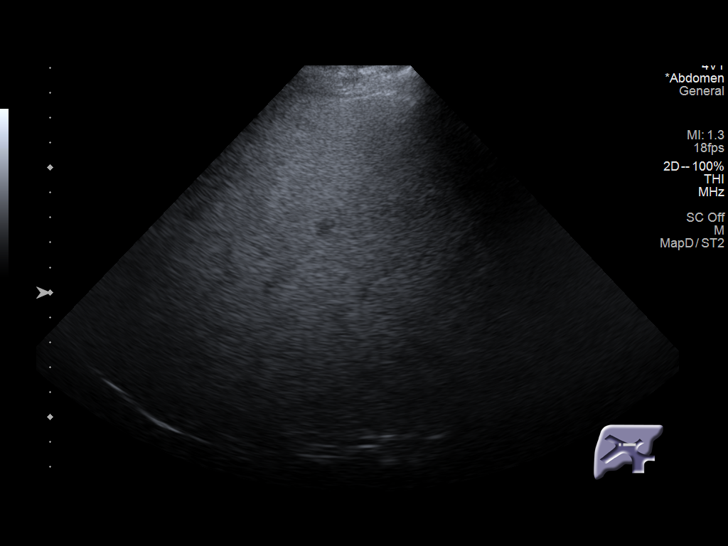
[im 59/65]
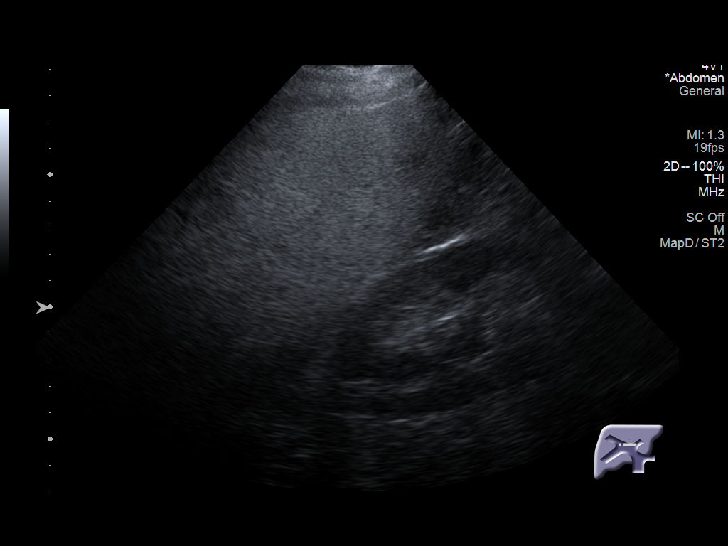
[im 65/65]
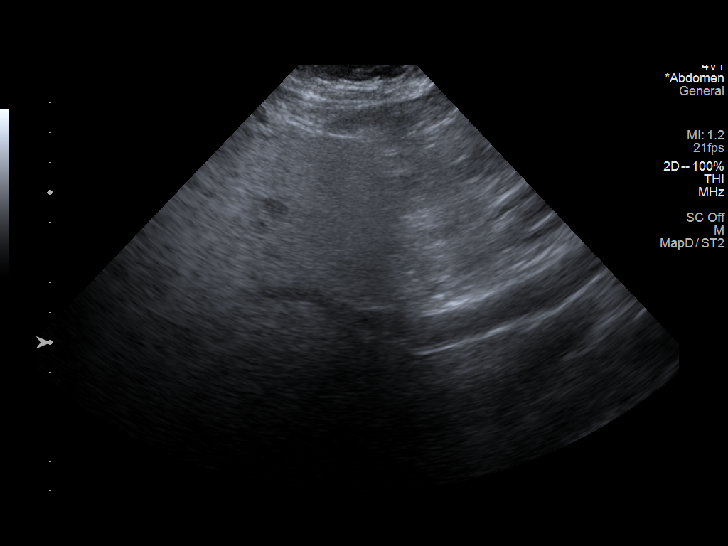

[14 of 25 positions shown; findings below may reference images not displayed]

FINDINGS: Gallbladder:

Gallbladder appears largely contracted around an echogenic,
shadowing gallstone measuring up to 2.2 cm towards the gallbladder
body and neck. No visible gallbladder wall thickening. No
pericholecystic fluid or inflammation. Sonographic Murphy sign
reportedly negative.

Common bile duct:

Diameter: 3.9 mm, nondilated.

Liver:

Diffusely increased hepatic echogenicity with loss of definition of
the portal triads and diminished posterior through transmission
compatible with hepatic steatosis. No focal lesion though detection
is somewhat limited given the diminished penetration portal vein is
patent on color Doppler imaging with normal direction of blood flow
towards the liver.

Other: None.
IMPRESSION: Cholelithiasis without sonographic evidence of acute cholecystitis.

Diffusely increased hepatic echogenicity and diminished through
transmission compatible with moderate to severe hepatic steatosis.

## 2020-12-05 DIAGNOSIS — E1144 Type 2 diabetes mellitus with diabetic amyotrophy: Secondary | ICD-10-CM | POA: Diagnosis not present

## 2020-12-05 DIAGNOSIS — H2513 Age-related nuclear cataract, bilateral: Secondary | ICD-10-CM | POA: Diagnosis not present

## 2020-12-05 DIAGNOSIS — H5022 Vertical strabismus, left eye: Secondary | ICD-10-CM | POA: Diagnosis not present

## 2020-12-05 LAB — HM DIABETES EYE EXAM

## 2020-12-10 ENCOUNTER — Other Ambulatory Visit: Payer: Self-pay | Admitting: Family

## 2020-12-26 ENCOUNTER — Other Ambulatory Visit: Payer: Self-pay

## 2020-12-26 ENCOUNTER — Encounter: Payer: Self-pay | Admitting: Family Medicine

## 2020-12-26 ENCOUNTER — Ambulatory Visit: Payer: BC Managed Care – PPO | Admitting: Family Medicine

## 2020-12-26 VITALS — BP 128/81 | HR 98 | Temp 97.4°F | Ht 64.0 in | Wt 162.0 lb

## 2020-12-26 DIAGNOSIS — R300 Dysuria: Secondary | ICD-10-CM

## 2020-12-26 DIAGNOSIS — N3001 Acute cystitis with hematuria: Secondary | ICD-10-CM

## 2020-12-26 LAB — URINALYSIS, ROUTINE W REFLEX MICROSCOPIC
Bilirubin, UA: NEGATIVE
Ketones, UA: NEGATIVE
Leukocytes,UA: NEGATIVE
Nitrite, UA: NEGATIVE
Protein,UA: NEGATIVE
Specific Gravity, UA: 1.015 (ref 1.005–1.030)
Urobilinogen, Ur: 0.2 mg/dL (ref 0.2–1.0)
pH, UA: 5.5 (ref 5.0–7.5)

## 2020-12-26 LAB — MICROSCOPIC EXAMINATION
Bacteria, UA: NONE SEEN
Epithelial Cells (non renal): NONE SEEN /hpf (ref 0–10)

## 2020-12-26 MED ORDER — CEPHALEXIN 500 MG PO CAPS: 500.0000 mg | ORAL_CAPSULE | Freq: Two times a day (BID) | ORAL | 0 refills | Status: AC

## 2020-12-26 NOTE — Progress Notes (Signed)
Assessment & Plan:  1. Acute cystitis with hematuria Education provided on UTIs. Encouraged adequate hydration. - cephALEXin (KEFLEX) 500 MG capsule; Take 1 capsule (500 mg total) by mouth 2 (two) times daily for 7 days.  Dispense: 14 capsule; Refill: 0 - Urine Culture  2. Dysuria - Urinalysis, Routine w reflex microscopic   Follow up plan: Return if symptoms worsen or fail to improve.  Hendricks Limes, MSN, APRN, FNP-C [facility]  Subjective:   Patient ID: Peggy Hall, female    DOB: 06-Feb-1961, 60 y.o.   MRN: [id number]  HPI: Peggy Hall is a 60 y.o. female presenting on 12/26/2020 for Dysuria (Patient states it started last night)  Patient complains of dysuria, frequency, hematuria, suprapubic pressure, and urgency She has had symptoms for 1 day. Patient denies fever.    ROS: Negative unless specifically indicated above in HPI.   Relevant past medical history reviewed and updated as indicated.   Allergies and medications reviewed and updated.   Current Outpatient Medications:    atorvastatin (LIPITOR) 10 MG tablet, Take 1 tablet by mouth once daily, Disp: 90 tablet, Rfl: 0   Dapagliflozin-metFORMIN HCl ER (XIGDUO XR) 10-998 MG TB24, Take 2 tablets by mouth every morning., Disp: 180 tablet, Rfl: 3   Dulaglutide (TRULICITY) 1.5 CN/4.7SJ SOPN, INJECT 1 DOSE SUBCUTANEOUSLY ONCE A WEEK, Disp: 12 mL, Rfl: 6   etodolac (LODINE) 500 MG tablet, Take 500 mg by mouth 2 (two) times daily., Disp: , Rfl:    lisinopril (ZESTRIL) 2.5 MG tablet, Take 1 tablet (2.5 mg total) by mouth daily., Disp: 90 tablet, Rfl: 3   Multiple Vitamins-Minerals (MULTIVITAMIN WITH MINERALS) tablet, Take by mouth daily., Disp: , Rfl:    Omega-3 Fatty Acids (FISH OIL) 1200 MG CAPS, Take by mouth., Disp: , Rfl:    propranolol (INDERAL) 20 MG tablet, Take 1 tablet by mouth twice daily, Disp: 180 tablet, Rfl: 0   QUEtiapine (SEROQUEL) 50 MG tablet, Take 1 tablet (50 mg total) by mouth at  bedtime., Disp: 90 tablet, Rfl: 3   sertraline (ZOLOFT) 50 MG tablet, Take 1 tablet by mouth once daily, Disp: 90 tablet, Rfl: 0  No Known Allergies  Objective:   BP 128/81   Pulse 98   Temp (!) 97.4 F (36.3 C) (Temporal)   Ht 5\' 4"  (1.626 m)   Wt 162 lb (73.5 kg)   SpO2 97%   BMI 27.81 kg/m    Physical Exam Vitals reviewed.  Constitutional:      General: She is not in acute distress.    Appearance: Normal appearance. She is not ill-appearing, toxic-appearing or diaphoretic.  HENT:     Head: Normocephalic and atraumatic.  Eyes:     General: No scleral icterus.       Right eye: No discharge.        Left eye: No discharge.     Conjunctiva/sclera: Conjunctivae normal.  Cardiovascular:     Rate and Rhythm: Normal rate.  Pulmonary:     Effort: Pulmonary effort is normal. No respiratory distress.  Musculoskeletal:        General: Normal range of motion.     Cervical back: Normal range of motion.  Skin:    General: Skin is warm and dry.     Capillary Refill: Capillary refill takes less than 2 seconds.  Neurological:     General: No focal deficit present.     Mental Status: She is alert and oriented to person, place, and time. Mental  status is at baseline.  Psychiatric:        Mood and Affect: Mood normal.        Behavior: Behavior normal.        Thought Content: Thought content normal.        Judgment: Judgment normal.

## 2020-12-27 ENCOUNTER — Encounter: Payer: Self-pay | Admitting: Family

## 2020-12-27 ENCOUNTER — Ambulatory Visit: Payer: BC Managed Care – PPO | Admitting: Family

## 2020-12-27 VITALS — BP 116/78 | HR 87 | Temp 97.1°F | Ht 64.0 in | Wt 161.2 lb

## 2020-12-27 DIAGNOSIS — K760 Fatty (change of) liver, not elsewhere classified: Secondary | ICD-10-CM

## 2020-12-27 DIAGNOSIS — R5383 Other fatigue: Secondary | ICD-10-CM | POA: Diagnosis not present

## 2020-12-27 DIAGNOSIS — I10 Essential (primary) hypertension: Secondary | ICD-10-CM

## 2020-12-27 DIAGNOSIS — E785 Hyperlipidemia, unspecified: Secondary | ICD-10-CM

## 2020-12-27 DIAGNOSIS — E1169 Type 2 diabetes mellitus with other specified complication: Secondary | ICD-10-CM

## 2020-12-27 DIAGNOSIS — F32A Depression, unspecified: Secondary | ICD-10-CM | POA: Diagnosis not present

## 2020-12-27 DIAGNOSIS — F411 Generalized anxiety disorder: Secondary | ICD-10-CM

## 2020-12-27 DIAGNOSIS — G4700 Insomnia, unspecified: Secondary | ICD-10-CM

## 2020-12-27 LAB — BAYER DCA HB A1C WAIVED: HB A1C (BAYER DCA - WAIVED): 7.9 % — ABNORMAL HIGH (ref <7.0)

## 2020-12-27 MED ORDER — TRAZODONE HCL 50 MG PO TABS: 50.0000 mg | ORAL_TABLET | Freq: Every day | ORAL | 1 refills | Status: DC

## 2020-12-27 NOTE — Patient Instructions (Signed)

## 2020-12-27 NOTE — Progress Notes (Signed)
Subjective:    Patient ID: Peggy Hall, female    DOB: 07/20/1960, 60 y.o.   MRN: [id number]  Chief Complaint  Patient presents with   Medical Management of Chronic Issues    FATIGUE ALL THE TIME     Pt presents to the office today for chronic follow up. She is followed by Rheumatologists every annually for Fibromyalgia. She was started on Etodolac 500mg  BID that is working well at this time.   She is complaining of fatigue that is been on going for months. She reports she waking during the night  Diabetes She presents for her follow-up diabetic visit. She has type 2 diabetes mellitus. Hypoglycemia symptoms include nervousness/anxiousness. Pertinent negatives for diabetes include no blurred vision and no foot paresthesias. Symptoms are stable. Pertinent negatives for diabetic complications include no heart disease or nephropathy. Risk factors for coronary artery disease include dyslipidemia, diabetes mellitus, hypertension, sedentary lifestyle and post-menopausal. She is following a generally unhealthy diet. Her overall blood glucose range is >200 mg/dl. An ACE inhibitor/angiotensin II receptor blocker is being taken.  Hyperlipidemia This is a chronic problem. The current episode started more than 1 year ago. Current antihyperlipidemic treatment includes statins. The current treatment provides moderate improvement of lipids. Risk factors for coronary artery disease include dyslipidemia, hypertension, a sedentary lifestyle and post-menopausal.  Anxiety Presents for follow-up visit. Symptoms include depressed mood, excessive worry, irritability, nervous/anxious behavior and restlessness. Symptoms occur most days. The severity of symptoms is moderate.    Depression        This is a chronic problem.  The current episode started more than 1 year ago.   Associated symptoms include irritable and restlessness.  Associated symptoms include no helplessness and no hopelessness.  Past treatments include  SSRIs - Selective serotonin reuptake inhibitors.  Past medical history includes anxiety.      Review of Systems  Constitutional:  Positive for irritability.  Eyes:  Negative for blurred vision.  Psychiatric/Behavioral:  Positive for depression. The patient is nervous/anxious.   All other systems reviewed and are negative.     Objective:   Physical Exam Vitals reviewed.  Constitutional:      General: She is irritable. She is not in acute distress.    Appearance: She is well-developed.  HENT:     Head: Normocephalic and atraumatic.     Right Ear: Tympanic membrane normal.     Left Ear: Tympanic membrane normal.  Eyes:     Pupils: Pupils are equal, round, and reactive to light.  Neck:     Thyroid: No thyromegaly.  Cardiovascular:     Rate and Rhythm: Normal rate and regular rhythm.     Heart sounds: Normal heart sounds. No murmur heard. Pulmonary:     Effort: Pulmonary effort is normal. No respiratory distress.     Breath sounds: Normal breath sounds. No wheezing.  Abdominal:     General: Bowel sounds are normal. There is no distension.     Palpations: Abdomen is soft.     Tenderness: There is no abdominal tenderness.  Musculoskeletal:        General: No tenderness. Normal range of motion.     Cervical back: Normal range of motion and neck supple.  Skin:    General: Skin is warm and dry.  Neurological:     Mental Status: She is alert and oriented to person, place, and time.     Cranial Nerves: No cranial nerve deficit.     Deep Tendon Reflexes: Reflexes  are normal and symmetric.  Psychiatric:        Behavior: Behavior normal.        Thought Content: Thought content normal.        Judgment: Judgment normal.        BP 116/78   Pulse 87   Temp (!) 97.1 F (36.2 C) (Temporal)   Ht 5\' 4"  (1.626 m)   Wt 161 lb 3.2 oz (73.1 kg)   SpO2 97%   BMI 27.67 kg/m   Assessment & Plan:  Celestina Gironda comes in today with chief complaint of Medical Management of Chronic Issues  (FATIGUE ALL THE TIME )   Diagnosis and orders addressed:  1. Essential hypertension - CBC with Differential/Platelet  2. Hepatic steatosis - CBC with Differential/Platelet  3. Type 2 diabetes mellitus with other specified complication, without long-term current use of insulin (HCC) - Bayer DCA Hb A1c Waived - CBC with Differential/Platelet  4. Hyperlipidemia associated with type 2 diabetes mellitus (HCC) - CBC with Differential/Platelet  5. Depression, unspecified depression type - CBC with Differential/Platelet  6. GAD (generalized anxiety disorder) - CBC with Differential/Platelet  7. Hyperlipidemia, unspecified hyperlipidemia type - CBC with Differential/Platelet  8. Fatigue, unspecified type - Anemia Profile B - traZODone (DESYREL) 50 MG tablet; Take 1-2 tablets (50-100 mg total) by mouth at bedtime.  Dispense: 180 tablet; Refill: 1  9. Insomnia, unspecified type Start trazodone 50-100 mg today Sleep ritual discussed  - traZODone (DESYREL) 50 MG tablet; Take 1-2 tablets (50-100 mg total) by mouth at bedtime.  Dispense: 180 tablet; Refill: 1   Labs pending Health Maintenance reviewed Diet and exercise encouraged  Follow up plan: 3 months    Evelina Dun, FNP

## 2020-12-28 ENCOUNTER — Other Ambulatory Visit: Payer: Self-pay | Admitting: Family

## 2020-12-28 ENCOUNTER — Telehealth: Payer: Self-pay | Admitting: Family Medicine

## 2020-12-28 LAB — ANEMIA PROFILE B
Basophils Absolute: 0.1 10*3/uL (ref 0.0–0.2)
Basos: 1 %
EOS (ABSOLUTE): 0.2 10*3/uL (ref 0.0–0.4)
Eos: 2 %
Ferritin: 177 ng/mL — ABNORMAL HIGH (ref 15–150)
Folate: >20 ng/mL (ref >3.0)
Hematocrit: 41 % (ref 34.0–46.6)
Hemoglobin: 13.7 g/dL (ref 11.1–15.9)
Immature Grans (Abs): 0.1 10*3/uL (ref 0.0–0.1)
Immature Granulocytes: 1 %
Iron Saturation: 27 % (ref 15–55)
Iron: 84 ug/dL (ref 27–159)
Lymphocytes Absolute: 2 10*3/uL (ref 0.7–3.1)
Lymphs: 24 %
MCH: 30.2 pg (ref 26.6–33.0)
MCHC: 33.4 g/dL (ref 31.5–35.7)
MCV: 91 fL (ref 79–97)
Monocytes Absolute: 0.7 10*3/uL (ref 0.1–0.9)
Monocytes: 8 %
Neutrophils Absolute: 5.2 10*3/uL (ref 1.4–7.0)
Neutrophils: 64 %
Platelets: 277 10*3/uL (ref 150–450)
RBC: 4.53 x10E6/uL (ref 3.77–5.28)
RDW: 12 % (ref 11.7–15.4)
Retic Ct Pct: 3.5 % — ABNORMAL HIGH (ref 0.6–2.6)
Total Iron Binding Capacity: 307 ug/dL (ref 250–450)
UIBC: 223 ug/dL (ref 131–425)
Vitamin B-12: 658 pg/mL (ref 232–1245)
WBC: 8.1 10*3/uL (ref 3.4–10.8)

## 2020-12-28 MED ORDER — EMPAGLIFLOZIN 10 MG PO TABS: 10.0000 mg | ORAL_TABLET | Freq: Every day | ORAL | 2 refills | Status: DC

## 2020-12-28 MED ORDER — GLIMEPIRIDE 2 MG PO TABS: 2.0000 mg | ORAL_TABLET | Freq: Every day | ORAL | 1 refills | Status: DC

## 2020-12-28 NOTE — Telephone Encounter (Signed)
Patient aware.

## 2020-12-28 NOTE — Telephone Encounter (Signed)
Glimepiride 2 mg Prescription sent to pharmacy.

## 2020-12-28 NOTE — Progress Notes (Signed)
Mbf

## 2020-12-28 NOTE — Telephone Encounter (Signed)
jARDIANCE DENIED CAN TRY GLIMEPIRED, GLIPIZIDE METFORMIN, PIOGLITAZONE

## 2020-12-29 LAB — URINE CULTURE

## 2020-12-31 DIAGNOSIS — M150 Primary generalized (osteo)arthritis: Secondary | ICD-10-CM | POA: Diagnosis not present

## 2020-12-31 DIAGNOSIS — M797 Fibromyalgia: Secondary | ICD-10-CM | POA: Diagnosis not present

## 2020-12-31 DIAGNOSIS — Z791 Long term (current) use of non-steroidal anti-inflammatories (NSAID): Secondary | ICD-10-CM | POA: Diagnosis not present

## 2021-01-07 ENCOUNTER — Telehealth: Payer: Self-pay | Admitting: Family

## 2021-01-07 ENCOUNTER — Other Ambulatory Visit: Payer: Self-pay | Admitting: Family

## 2021-01-07 NOTE — Telephone Encounter (Signed)
Patient aware and verbalized understanding. °

## 2021-01-07 NOTE — Telephone Encounter (Signed)
Ok to stop glimepiride, but needs to be on strict low carb diet.

## 2021-01-25 ENCOUNTER — Ambulatory Visit: Payer: BC Managed Care – PPO | Admitting: Family

## 2021-02-11 DIAGNOSIS — M65322 Trigger finger, left index finger: Secondary | ICD-10-CM | POA: Diagnosis not present

## 2021-02-14 ENCOUNTER — Other Ambulatory Visit: Payer: Self-pay | Admitting: Family

## 2021-02-14 DIAGNOSIS — E1169 Type 2 diabetes mellitus with other specified complication: Secondary | ICD-10-CM

## 2021-03-09 ENCOUNTER — Other Ambulatory Visit: Payer: Self-pay | Admitting: Family

## 2021-04-01 ENCOUNTER — Ambulatory Visit: Payer: BC Managed Care – PPO | Admitting: Family

## 2021-04-01 ENCOUNTER — Other Ambulatory Visit: Payer: Self-pay

## 2021-04-01 ENCOUNTER — Encounter: Payer: Self-pay | Admitting: Family

## 2021-04-01 VITALS — BP 122/81 | HR 74 | Temp 97.0°F | Ht 64.0 in | Wt 151.2 lb

## 2021-04-01 DIAGNOSIS — E785 Hyperlipidemia, unspecified: Secondary | ICD-10-CM

## 2021-04-01 DIAGNOSIS — F32A Depression, unspecified: Secondary | ICD-10-CM | POA: Diagnosis not present

## 2021-04-01 DIAGNOSIS — M797 Fibromyalgia: Secondary | ICD-10-CM

## 2021-04-01 DIAGNOSIS — E1169 Type 2 diabetes mellitus with other specified complication: Secondary | ICD-10-CM | POA: Diagnosis not present

## 2021-04-01 DIAGNOSIS — I10 Essential (primary) hypertension: Secondary | ICD-10-CM

## 2021-04-01 DIAGNOSIS — F411 Generalized anxiety disorder: Secondary | ICD-10-CM

## 2021-04-01 DIAGNOSIS — M150 Primary generalized (osteo)arthritis: Secondary | ICD-10-CM | POA: Diagnosis not present

## 2021-04-01 DIAGNOSIS — Z791 Long term (current) use of non-steroidal anti-inflammatories (NSAID): Secondary | ICD-10-CM | POA: Diagnosis not present

## 2021-04-01 LAB — CBC WITH DIFFERENTIAL/PLATELET
Basophils Absolute: 0.1 10*3/uL (ref 0.0–0.2)
Basos: 1 %
EOS (ABSOLUTE): 0.1 10*3/uL (ref 0.0–0.4)
Eos: 1 %
Hematocrit: 40.4 % (ref 34.0–46.6)
Hemoglobin: 14.1 g/dL (ref 11.1–15.9)
Immature Grans (Abs): 0 10*3/uL (ref 0.0–0.1)
Immature Granulocytes: 0 %
Lymphocytes Absolute: 1.7 10*3/uL (ref 0.7–3.1)
Lymphs: 23 %
MCH: 31.2 pg (ref 26.6–33.0)
MCHC: 34.9 g/dL (ref 31.5–35.7)
MCV: 89 fL (ref 79–97)
Monocytes Absolute: 0.5 10*3/uL (ref 0.1–0.9)
Monocytes: 7 %
Neutrophils Absolute: 5.2 10*3/uL (ref 1.4–7.0)
Neutrophils: 68 %
Platelets: 273 10*3/uL (ref 150–450)
RBC: 4.52 x10E6/uL (ref 3.77–5.28)
RDW: 11.8 % (ref 11.7–15.4)
WBC: 7.6 10*3/uL (ref 3.4–10.8)

## 2021-04-01 LAB — CMP14+EGFR
ALT: 47 IU/L — ABNORMAL HIGH (ref 0–32)
AST: 26 IU/L (ref 0–40)
Albumin/Globulin Ratio: 2.5 — ABNORMAL HIGH (ref 1.2–2.2)
Albumin: 4.7 g/dL (ref 3.8–4.9)
Alkaline Phosphatase: 79 IU/L (ref 44–121)
BUN/Creatinine Ratio: 23 (ref 9–23)
BUN: 18 mg/dL (ref 6–24)
Bilirubin Total: 0.5 mg/dL (ref 0.0–1.2)
CO2: 24 mmol/L (ref 20–29)
Calcium: 10 mg/dL (ref 8.7–10.2)
Chloride: 102 mmol/L (ref 96–106)
Creatinine, Ser: 0.79 mg/dL (ref 0.57–1.00)
Globulin, Total: 1.9 g/dL (ref 1.5–4.5)
Glucose: 111 mg/dL — ABNORMAL HIGH (ref 70–99)
Potassium: 5.4 mmol/L — ABNORMAL HIGH (ref 3.5–5.2)
Sodium: 145 mmol/L — ABNORMAL HIGH (ref 134–144)
Total Protein: 6.6 g/dL (ref 6.0–8.5)
eGFR: 86 mL/min/{1.73_m2} (ref >59)

## 2021-04-01 LAB — BAYER DCA HB A1C WAIVED: HB A1C (BAYER DCA - WAIVED): 5.8 % — ABNORMAL HIGH (ref 4.8–5.6)

## 2021-04-01 MED ORDER — TRULICITY 0.75 MG/0.5ML ~~LOC~~ SOAJ: 0.7500 mg | SUBCUTANEOUS | 2 refills | Status: DC

## 2021-04-01 NOTE — Patient Instructions (Signed)
Diarrhea, Adult Diarrhea is frequent loose and watery bowel movements. Diarrhea can make you feel weak and cause you to become dehydrated. Dehydration can make you tiredand thirsty, cause you to have a dry mouth, and decrease how often you urinate. Diarrhea typically lasts 2-3 days. However, it can last longer if it is a sign of something more serious. It is important to treat your diarrhea as told byyour health care provider. Follow these instructions at home: Eating and drinking     Follow these recommendations as told by your health care provider: Take an oral rehydration solution (ORS). This is an over-the-counter medicine that helps return your body to its normal balance of nutrients and water. It is found at pharmacies and retail stores. Drink plenty of fluids, such as water, ice chips, diluted fruit juice, and low-calorie sports drinks. You can drink milk also, if desired. Avoid drinking fluids that contain a lot of sugar or caffeine, such as energy drinks, sports drinks, and soda. Eat bland, easy-to-digest foods in small amounts as you are able. These foods include bananas, applesauce, rice, lean meats, toast, and crackers. Avoid alcohol. Avoid spicy or fatty foods.  Medicines Take over-the-counter and prescription medicines only as told by your health care provider. If you were prescribed an antibiotic medicine, take it as told by your health care provider. Do not stop using the antibiotic even if you start to feel better. General instructions  Wash your hands often using soap and water. If soap and water are not available, use a hand sanitizer. Others in the household should wash their hands as well. Hands should be washed: After using the toilet or changing a diaper. Before preparing, cooking, or serving food. While caring for a sick person or while visiting someone in a hospital. Drink enough fluid to keep your urine pale yellow. Rest at home while you recover. Watch your  condition for any changes. Take a warm bath to relieve any burning or pain from frequent diarrhea episodes. Keep all follow-up visits as told by your health care provider. This is important.  Contact a health care provider if: You have a fever. Your diarrhea gets worse. You have new symptoms. You cannot keep fluids down. You feel light-headed or dizzy. You have a headache. You have muscle cramps. Get help right away if: You have chest pain. You feel extremely weak or you faint. You have bloody or black stools or stools that look like tar. You have severe pain, cramping, or bloating in your abdomen. You have trouble breathing or you are breathing very quickly. Your heart is beating very quickly. Your skin feels cold and clammy. You feel confused. You have signs of dehydration, such as: Dark urine, very little urine, or no urine. Cracked lips. Dry mouth. Sunken eyes. Sleepiness. Weakness. Summary Diarrhea is frequent loose and watery bowel movements. Diarrhea can make you feel weak and cause you to become dehydrated. Drink enough fluids to keep your urine pale yellow. Make sure that you wash your hands after using the toilet. If soap and water are not available, use hand sanitizer. Contact a health care provider if your diarrhea gets worse or you have new symptoms. Get help right away if you have signs of dehydration. This information is not intended to replace advice given to you by your health care provider. Make sure you discuss any questions you have with your healthcare provider. Document Revised: 10/26/2018 Document Reviewed: 11/13/2017 Elsevier Patient Education  2022 [employer]  

## 2021-04-01 NOTE — Progress Notes (Signed)
Subjective:    Patient ID: Peggy Hall, female    DOB: Apr 26, 1961, 60 y.o.   MRN: [id number]  Chief Complaint  Patient presents with   Medical Management of Chronic Issues   Pt presents to the office today for chronic follow up. She is followed by Rheumatologists every 3 months for Fibromyalgia. She was started on Etodolac 578m BID that is working well at this time.  Diabetes She presents for her follow-up diabetic visit. She has type 2 diabetes mellitus. Pertinent negatives for hypoglycemia include no nervousness/anxiousness. Pertinent negatives for diabetes include no blurred vision, no foot paresthesias and no weight loss. Symptoms are stable. Risk factors for coronary artery disease include dyslipidemia, diabetes mellitus, hypertension, sedentary lifestyle and post-menopausal. She is following a generally healthy diet. Her overall blood glucose range is 110-130 mg/dl. Eye exam is current.  Hypertension This is a chronic problem. The current episode started more than 1 year ago. The problem has been resolved since onset. The problem is controlled. Associated symptoms include anxiety. Pertinent negatives include no blurred vision, malaise/fatigue, peripheral edema or shortness of breath. Risk factors for coronary artery disease include dyslipidemia. The current treatment provides moderate improvement. There is no history of heart failure.  Depression        This is a chronic problem.  The current episode started more than 1 year ago.   The problem occurs rarely.  Associated symptoms include irritable and sad.  Associated symptoms include no helplessness, no hopelessness and no restlessness.  Compliance with treatment is good.  Past medical history includes anxiety.   Anxiety Presents for follow-up visit. Symptoms include excessive worry. Patient reports no depressed mood, irritability, nervous/anxious behavior, restlessness or shortness of breath. Symptoms occur rarely. The severity of symptoms  is moderate.    Hyperlipidemia This is a chronic problem. The current episode started more than 1 year ago. Pertinent negatives include no shortness of breath. Current antihyperlipidemic treatment includes statins. The current treatment provides moderate improvement of lipids. Risk factors for coronary artery disease include dyslipidemia, diabetes mellitus, hypertension, a sedentary lifestyle and post-menopausal.     Review of Systems  Constitutional:  Negative for irritability, malaise/fatigue and weight loss.  Eyes:  Negative for blurred vision.  Respiratory:  Negative for shortness of breath.   Psychiatric/Behavioral:  Positive for depression. The patient is not nervous/anxious.   All other systems reviewed and are negative.     Objective:   Physical Exam Vitals reviewed.  Constitutional:      General: She is irritable. She is not in acute distress.    Appearance: She is well-developed.  HENT:     Head: Normocephalic and atraumatic.     Right Ear: Tympanic membrane normal.     Left Ear: Tympanic membrane normal.  Eyes:     Pupils: Pupils are equal, round, and reactive to light.  Neck:     Thyroid: No thyromegaly.  Cardiovascular:     Rate and Rhythm: Normal rate and regular rhythm.     Heart sounds: Normal heart sounds. No murmur heard. Pulmonary:     Effort: Pulmonary effort is normal. No respiratory distress.     Breath sounds: Normal breath sounds. No wheezing.  Abdominal:     General: Bowel sounds are normal. There is no distension.     Palpations: Abdomen is soft.     Tenderness: There is no abdominal tenderness.  Musculoskeletal:        General: No tenderness. Normal range of motion.  Cervical back: Normal range of motion and neck supple.  Skin:    General: Skin is warm and dry.  Neurological:     Mental Status: She is alert and oriented to person, place, and time.     Cranial Nerves: No cranial nerve deficit.     Deep Tendon Reflexes: Reflexes are normal  and symmetric.  Psychiatric:        Behavior: Behavior normal.        Thought Content: Thought content normal.        Judgment: Judgment normal.      BP 122/81   Pulse 74   Temp (!) 97 F (36.1 C) (Temporal)   Ht _0  (1.626 m)   Wt 151 lb 3.2 oz (68.6 kg)   BMI 25.95 kg/m      Assessment & Plan:  [name] comes in today with chief complaint of Medical Management of Chronic Issues   Diagnosis and orders addressed:  1. Essential hypertension - CMP14+EGFR - CBC with Differential/Platelet  2. Type 2 diabetes mellitus with other specified complication, without long-term current use of insulin (HCC) Will decrease Trulicity to 7.12 mg from 1.5 mg related to diarrhea Low carb diet - CMP14+EGFR - CBC with Differential/Platelet - Bayer DCA Hb A1c Waived - Dulaglutide (TRULICITY) 7.87 ZU/3.6DQ SOPN; Inject 0.75 mg into the skin once a week.  Dispense: 6 mL; Refill: 2  3. Hyperlipidemia associated with type 2 diabetes mellitus (HCC) - CMP14+EGFR - CBC with Differential/Platelet  4. Depression, unspecified depression type - CMP14+EGFR - CBC with Differential/Platelet  5. Fibromyalgia - CMP14+EGFR - CBC with Differential/Platelet  6. Hyperlipidemia, unspecified hyperlipidemia type - CMP14+EGFR - CBC with Differential/Platelet  7. GAD (generalized anxiety disorder) - CMP14+EGFR - CBC with Differential/Platelet   Labs pending Health Maintenance reviewed Diet and exercise encouraged  Follow up plan: 6 months    Evelina Dun, FNP

## 2021-04-02 NOTE — Progress Notes (Signed)
Pt r/c about lab results. Please call back.

## 2021-04-15 ENCOUNTER — Other Ambulatory Visit: Payer: Self-pay | Admitting: Family

## 2021-04-18 ENCOUNTER — Encounter: Payer: Self-pay | Admitting: *Deleted

## 2021-04-25 DIAGNOSIS — M797 Fibromyalgia: Secondary | ICD-10-CM | POA: Diagnosis not present

## 2021-04-29 DIAGNOSIS — Z1231 Encounter for screening mammogram for malignant neoplasm of breast: Secondary | ICD-10-CM | POA: Diagnosis not present

## 2021-05-13 ENCOUNTER — Other Ambulatory Visit: Payer: Self-pay | Admitting: Family

## 2021-05-13 DIAGNOSIS — E1169 Type 2 diabetes mellitus with other specified complication: Secondary | ICD-10-CM

## 2021-05-13 DIAGNOSIS — E785 Hyperlipidemia, unspecified: Secondary | ICD-10-CM

## 2021-06-10 ENCOUNTER — Other Ambulatory Visit: Payer: Self-pay | Admitting: Family

## 2021-07-02 ENCOUNTER — Ambulatory Visit: Payer: BC Managed Care – PPO | Admitting: Family

## 2021-07-02 DIAGNOSIS — Z791 Long term (current) use of non-steroidal anti-inflammatories (NSAID): Secondary | ICD-10-CM | POA: Diagnosis not present

## 2021-07-02 DIAGNOSIS — M150 Primary generalized (osteo)arthritis: Secondary | ICD-10-CM | POA: Diagnosis not present

## 2021-07-03 ENCOUNTER — Encounter: Payer: Self-pay | Admitting: Family

## 2021-07-03 ENCOUNTER — Ambulatory Visit: Payer: BC Managed Care – PPO | Admitting: Family

## 2021-07-03 VITALS — BP 138/69 | HR 76 | Temp 97.4°F | Ht 64.0 in | Wt 157.4 lb

## 2021-07-03 DIAGNOSIS — Z23 Encounter for immunization: Secondary | ICD-10-CM | POA: Diagnosis not present

## 2021-07-03 DIAGNOSIS — E785 Hyperlipidemia, unspecified: Secondary | ICD-10-CM | POA: Diagnosis not present

## 2021-07-03 DIAGNOSIS — F32A Depression, unspecified: Secondary | ICD-10-CM

## 2021-07-03 DIAGNOSIS — K760 Fatty (change of) liver, not elsewhere classified: Secondary | ICD-10-CM | POA: Diagnosis not present

## 2021-07-03 DIAGNOSIS — I10 Essential (primary) hypertension: Secondary | ICD-10-CM

## 2021-07-03 DIAGNOSIS — E1169 Type 2 diabetes mellitus with other specified complication: Secondary | ICD-10-CM

## 2021-07-03 DIAGNOSIS — F411 Generalized anxiety disorder: Secondary | ICD-10-CM

## 2021-07-03 DIAGNOSIS — R7989 Other specified abnormal findings of blood chemistry: Secondary | ICD-10-CM

## 2021-07-03 LAB — BAYER DCA HB A1C WAIVED: HB A1C (BAYER DCA - WAIVED): 6.4 % — ABNORMAL HIGH (ref 4.8–5.6)

## 2021-07-03 NOTE — Progress Notes (Signed)
Subjective:    Patient ID: Peggy Hall, female    DOB: [birth year], 61 y.o.   MRN: [id number]  Chief Complaint  Patient presents with   Medical Management of Chronic Issues   Pt presents to the office today for chronic follow up. She is followed by Rheumatologists every 3 months for Fibromyalgia. She was started on Etodolac 565m BID that is working well at this time.   She has hepatic steatosis  Hypertension This is a chronic problem. The current episode started more than 1 year ago. The problem has been resolved since onset. The problem is controlled. Associated symptoms include anxiety. Pertinent negatives include no blurred vision, malaise/fatigue, peripheral edema or shortness of breath. Risk factors for coronary artery disease include dyslipidemia, diabetes mellitus, obesity and sedentary lifestyle. The current treatment provides moderate improvement.  Diabetes She presents for her follow-up diabetic visit. She has type 2 diabetes mellitus. Hypoglycemia symptoms include nervousness/anxiousness. Pertinent negatives for diabetes include no blurred vision and no foot paresthesias. Symptoms are stable. Risk factors for coronary artery disease include dyslipidemia, diabetes mellitus, hypertension and sedentary lifestyle. She is following a generally healthy diet. Her overall blood glucose range is 140-180 mg/dl.  Hyperlipidemia This is a chronic problem. The current episode started more than 1 year ago. The problem is controlled. Exacerbating diseases include obesity. Pertinent negatives include no shortness of breath. Current antihyperlipidemic treatment includes statins. The current treatment provides moderate improvement of lipids.  Anxiety Presents for follow-up visit. Symptoms include excessive worry, nervous/anxious behavior and restlessness. Patient reports no irritability or shortness of breath. Symptoms occur occasionally.    Depression        This is a chronic problem.  The current  episode started more than 1 year ago.   The onset quality is gradual.   The problem occurs intermittently.  Associated symptoms include restlessness.  Compliance with treatment is good.  Past medical history includes anxiety.      Review of Systems  Constitutional:  Negative for irritability and malaise/fatigue.  Eyes:  Negative for blurred vision.  Respiratory:  Negative for shortness of breath.   Psychiatric/Behavioral:  Positive for depression. The patient is nervous/anxious.   All other systems reviewed and are negative.     Objective:   Physical Exam Vitals reviewed.  Constitutional:      General: She is not in acute distress.    Appearance: She is well-developed.  HENT:     Head: Normocephalic and atraumatic.     Right Ear: Tympanic membrane normal.     Left Ear: Tympanic membrane normal.  Eyes:     Pupils: Pupils are equal, round, and reactive to light.  Neck:     Thyroid: No thyromegaly.  Cardiovascular:     Rate and Rhythm: Normal rate and regular rhythm.     Heart sounds: Normal heart sounds. No murmur heard. Pulmonary:     Effort: Pulmonary effort is normal. No respiratory distress.     Breath sounds: Normal breath sounds. No wheezing.  Abdominal:     General: Bowel sounds are normal. There is no distension.     Palpations: Abdomen is soft.     Tenderness: There is no abdominal tenderness.  Musculoskeletal:        General: No tenderness. Normal range of motion.     Cervical back: Normal range of motion and neck supple.  Skin:    General: Skin is warm and dry.  Neurological:     Mental Status: She is alert and  oriented to person, place, and time.     Cranial Nerves: No cranial nerve deficit.     Deep Tendon Reflexes: Reflexes are normal and symmetric.  Psychiatric:        Behavior: Behavior normal.        Thought Content: Thought content normal.        Judgment: Judgment normal.      BP 138/69    Pulse 76    Temp (!) 97.4 F (36.3 C) (Temporal)    Ht 5'  4" (1.626 m)    Wt 157 lb 6.4 oz (71.4 kg)    BMI 27.02 kg/m      Assessment & Plan:  [name] comes in today with chief complaint of Medical Management of Chronic Issues   Diagnosis and orders addressed:  1. Essential hypertension - CBC with Differential/Platelet - BMP8+EGFR - Hepatic function panel  2. Type 2 diabetes mellitus with other specified complication, without long-term current use of insulin (HCC) - Bayer DCA Hb A1c Waived - CBC with Differential/Platelet - BMP8+EGFR - Hepatic function panel  3. Hyperlipidemia associated with type 2 diabetes mellitus ([place]) - CBC with Differential/Platelet - BMP8+EGFR - Hepatic function panel  4. Hepatic steatosis Low fat diet and exercise Limit alcohol and tylenol  - CBC with Differential/Platelet - BMP8+EGFR - Hepatic function panel  5. Hyperlipidemia, unspecified hyperlipidemia type - CBC with Differential/Platelet - BMP8+EGFR - Hepatic function panel  6. GAD (generalized anxiety disorder) - CBC with Differential/Platelet - BMP8+EGFR - Hepatic function panel  7. Depression, unspecified depression type - CBC with Differential/Platelet - BMP8+EGFR - Hepatic function panel  8. Abnormal LFTs - CBC with Differential/Platelet - BMP8+EGFR - Hepatic function panel   Labs pending Health Maintenance reviewed Diet and exercise encouraged  Follow up plan: 3 months    Peggy Dun, FNP

## 2021-07-03 NOTE — Patient Instructions (Signed)
Fatty Liver Disease  The liver converts food into energy, removes toxic material from the blood, makes important proteins, and absorbs necessary vitamins from food. Fatty liver disease occurs when too much fat has built up in your liver cells. Fatty liverdisease is also called hepatic steatosis. In many cases, fatty liver disease does not cause symptoms or problems. It is often diagnosed when tests are being done for other reasons. However, over time, fatty liver can cause inflammation that may lead to more serious liver problems, such as scarring of the liver (cirrhosis) and liver failure. Fatty liver is associated with insulin resistance, increased body fat, high blood pressure (hypertension), and high cholesterol. These are features of metabolic syndrome and increaseyour risk for stroke, diabetes, and heart disease. What are the causes? This condition may be caused by components of metabolic syndrome: Obesity. Insulin resistance. High cholesterol. Other causes: Alcohol abuse. Poor nutrition. Cushing syndrome. Pregnancy. Certain drugs. Poisons. Some viral infections. What increases the risk? You are more likely to develop this condition if you: Abuse alcohol. Are overweight. Have diabetes. Have hepatitis. Have a high triglyceride level. Are pregnant. What are the signs or symptoms? Fatty liver disease often does not cause symptoms. If symptoms do develop, they can include: Fatigue and weakness. Weight loss. Confusion. Nausea, vomiting, or abdominal pain. Yellowing of your skin and the white parts of your eyes (jaundice). Itchy skin. How is this diagnosed? This condition may be diagnosed by: A physical exam and your medical history. Blood tests. Imaging tests, such as an ultrasound, CT scan, or MRI. A liver biopsy. A small sample of liver tissue is removed using a needle. The sample is then looked at under a microscope. How is this treated? Fatty liver disease is often  caused by other health conditions. Treatment for fatty liver may involve medicines and lifestyle changes to manage conditions such as: Alcoholism. High cholesterol. Diabetes. Being overweight or obese. Follow these instructions at home:  Do not drink alcohol. If you have trouble quitting, ask your health care provider how to safely quit with the help of medicine or a supervised program. This is important to keep your condition from getting worse. Eat a healthy diet as told by your health care provider. Ask your health care provider about working with a dietitian to develop an eating plan. Exercise regularly. This can help you lose weight and control your cholesterol and diabetes. Talk to your health care provider about an exercise plan and which activities are best for you. Take over-the-counter and prescription medicines only as told by your health care provider. Keep all follow-up visits. This is important. Contact a health care provider if: You have trouble controlling your: Blood sugar. This is especially important if you have diabetes. Cholesterol. Drinking of alcohol. Get help right away if: You have abdominal pain. You have jaundice. You have nausea and are vomiting. You vomit blood or material that looks like coffee grounds. You have stools that are black, tar-like, or bloody. Summary Fatty liver disease develops when too much fat builds up in the cells of your liver. Fatty liver disease often causes no symptoms or problems. However, over time, fatty liver can cause inflammation that may lead to more serious liver problems, such as scarring of the liver (cirrhosis). You are more likely to develop this condition if you abuse alcohol, are pregnant, are overweight, have diabetes, have hepatitis, or have high triglyceride or cholesterol levels. Contact your health care provider if you have trouble controlling your blood sugar, cholesterol,   or drinking of alcohol. This information is  not intended to replace advice given to you by your health care provider. Make sure you discuss any questions you have with your healthcare provider. Document Revised: 03/22/2020 Document Reviewed: 03/22/2020 Elsevier Patient Education  2022 Elsevier Inc.  

## 2021-07-04 ENCOUNTER — Other Ambulatory Visit: Payer: Self-pay | Admitting: Family

## 2021-07-04 ENCOUNTER — Telehealth: Payer: Self-pay | Admitting: Family

## 2021-07-04 DIAGNOSIS — E875 Hyperkalemia: Secondary | ICD-10-CM

## 2021-07-04 LAB — CBC WITH DIFFERENTIAL/PLATELET
Basophils Absolute: 0.1 10*3/uL (ref 0.0–0.2)
Basos: 1 %
EOS (ABSOLUTE): 0.2 10*3/uL (ref 0.0–0.4)
Eos: 2 %
Hematocrit: 43 % (ref 34.0–46.6)
Hemoglobin: 14.3 g/dL (ref 11.1–15.9)
Immature Grans (Abs): 0.1 10*3/uL (ref 0.0–0.1)
Immature Granulocytes: 1 %
Lymphocytes Absolute: 1.8 10*3/uL (ref 0.7–3.1)
Lymphs: 21 %
MCH: 30.2 pg (ref 26.6–33.0)
MCHC: 33.3 g/dL (ref 31.5–35.7)
MCV: 91 fL (ref 79–97)
Monocytes Absolute: 0.7 10*3/uL (ref 0.1–0.9)
Monocytes: 8 %
Neutrophils Absolute: 5.6 10*3/uL (ref 1.4–7.0)
Neutrophils: 67 %
Platelets: 310 10*3/uL (ref 150–450)
RBC: 4.73 x10E6/uL (ref 3.77–5.28)
RDW: 11.6 % — ABNORMAL LOW (ref 11.7–15.4)
WBC: 8.3 10*3/uL (ref 3.4–10.8)

## 2021-07-04 LAB — HEPATIC FUNCTION PANEL
ALT: 47 IU/L — ABNORMAL HIGH (ref 0–32)
AST: 24 IU/L (ref 0–40)
Albumin: 4.5 g/dL (ref 3.8–4.9)
Alkaline Phosphatase: 97 IU/L (ref 44–121)
Bilirubin Total: 0.5 mg/dL (ref 0.0–1.2)
Bilirubin, Direct: 0.17 mg/dL (ref 0.00–0.40)
Total Protein: 6.8 g/dL (ref 6.0–8.5)

## 2021-07-04 LAB — BMP8+EGFR
BUN/Creatinine Ratio: 19 (ref 9–23)
BUN: 15 mg/dL (ref 6–24)
CO2: 26 mmol/L (ref 20–29)
Calcium: 10.1 mg/dL (ref 8.7–10.2)
Chloride: 101 mmol/L (ref 96–106)
Creatinine, Ser: 0.78 mg/dL (ref 0.57–1.00)
Glucose: 130 mg/dL — ABNORMAL HIGH (ref 70–99)
Potassium: 5.9 mmol/L (ref 3.5–5.2)
Sodium: 139 mmol/L (ref 134–144)
eGFR: 87 mL/min/{1.73_m2} (ref >59)

## 2021-07-04 MED ORDER — SODIUM POLYSTYRENE SULFONATE PO POWD: Freq: Once | ORAL | 1 refills | Status: AC

## 2021-07-04 NOTE — Telephone Encounter (Signed)
Patient aware and verbalized understanding. °

## 2021-07-05 NOTE — Telephone Encounter (Signed)
Pt called - potassium discussed again

## 2021-07-06 ENCOUNTER — Emergency Department
Admission: RE | Admit: 2021-07-06 | Discharge: 2021-07-06 | Disposition: A | Discharge status: Home / Self Care | Payer: BC Managed Care – PPO | Source: Ambulatory Visit

## 2021-07-06 ENCOUNTER — Other Ambulatory Visit: Payer: Self-pay

## 2021-07-06 VITALS — BP 156/90 | HR 71 | Temp 97.8°F | Resp 16

## 2021-07-06 DIAGNOSIS — E875 Hyperkalemia: Secondary | ICD-10-CM

## 2021-07-06 MED ORDER — SODIUM POLYSTYRENE SULFONATE 15 GM/60ML PO SUSP: 15.0000 g | Freq: Once | ORAL | 1 refills | Status: AC

## 2021-07-06 NOTE — ED Provider Notes (Signed)
[facility]    CSN: [id number] Arrival date & time: 07/06/21  1011      History   Chief Complaint Chief Complaint  Patient presents with   Medication Refill    HPI Peggy Hall is a 61 y.o. female.   HPI 61 year old female presents with request for medication change.  Reports [facility] PCP wrote Kayexalate in powder form and would like liquid form of medication instead of the 1 she has been prescribed.  PCP ordered Kayexalate (sodium polystyrene powder) 454 g take by mouth 1 dose oral with 1 refill.  Patient reports that she is unable to find powdered form and would request liquid.  Past Medical History:  Diagnosis Date   Cancer ([place]) 2006   cervical   Depression    Diabetes mellitus without complication ([place])    Fibromyalgia    Hepatic steatosis    Hyperlipidemia    Hypertension     Patient Active Problem List   Diagnosis Date Noted   Chronic diarrhea 09/12/2020   H/O adenomatous polyp of colon 09/12/2020   Hepatic steatosis    Abnormal LFTs    GAD (generalized anxiety disorder) 12/19/2019   Hyperlipidemia 05/15/2018   Fibromyalgia 11/07/2016   Depression 06/02/2014   Essential hypertension 06/02/2014   Type 2 diabetes mellitus ([place]) 06/02/2014   Hyperlipidemia associated with type 2 diabetes mellitus ([place]) 06/02/2014    Past Surgical History:  Procedure Laterality Date   ABDOMINAL HYSTERECTOMY     COLONOSCOPY  09/2014   [place]: two adenomatous colon polyps removed. advised 5 year follow up colonoscopy    OB History   No obstetric history on file.      Home Medications    Prior to Admission medications   Medication Sig Start Date End Date Taking? Authorizing Provider  sodium polystyrene (KAYEXALATE) 15 GM/60ML suspension Take 60 mLs (15 g total) by mouth once for 1 dose. 07/06/21 07/06/21 Yes Eliezer Lofts, FNP  atorvastatin (LIPITOR) 10 MG tablet Take 1 tablet by mouth once daily 05/14/21   Evelina Dun A, FNP   Dapagliflozin-metFORMIN HCl ER (XIGDUO XR) 10-998 MG TB24 Take 2 tablets by mouth every morning. 06/19/20   Evelina Dun A, FNP  Dulaglutide (TRULICITY) 7.06 CB/7.6EG SOPN Inject 0.75 mg into the skin once a week. 04/01/21   Evelina Dun A, FNP  etodolac (LODINE) 500 MG tablet Take 500 mg by mouth 2 (two) times daily.    [provider]  Multiple Vitamins-Minerals (MULTIVITAMIN WITH MINERALS) tablet Take by mouth daily. Patient not taking: Reported on 07/03/2021    [provider]  Omega-3 Fatty Acids (FISH OIL) 1200 MG CAPS Take by mouth. Patient not taking: Reported on 07/03/2021    [provider]  propranolol (INDERAL) 20 MG tablet Take 1 tablet by mouth twice daily 04/15/21   Evelina Dun A, FNP  QUEtiapine (SEROQUEL) 50 MG tablet Take 1 tablet (50 mg total) by mouth at bedtime. 06/19/20   Sharion Balloon, FNP  sertraline (ZOLOFT) 50 MG tablet Take 1 tablet by mouth once daily 06/11/21   Evelina Dun A, FNP  traZODone (DESYREL) 50 MG tablet Take 1-2 tablets (50-100 mg total) by mouth at bedtime. 12/27/20   Sharion Balloon, FNP    Family History Family History  Problem Relation Age of Onset   Stroke Mother    Heart disease Mother    Depression Mother    Dementia Mother    Stroke Father    Hypertension Father  Diabetes Father    Hyperlipidemia Father    Peripheral Artery Disease Father    Bipolar disorder Sister    Cancer Maternal Grandmother            Colon cancer Maternal Grandmother    Liver disease Neg Hx     Social History Social History   Tobacco Use   Smoking status: Former    Types: Cigarettes   Smokeless tobacco: Never  Vaping Use   Vaping Use: Never used  Substance Use Topics   Alcohol use: No   Drug use: No     Allergies   Patient has no known allergies.   Review of Systems Review of Systems  All other systems reviewed and are negative.   Physical Exam Triage Vital Signs ED Triage Vitals  Enc Vitals Group      BP 07/06/21 1039 (!) 156/90     Pulse Rate 07/06/21 1039 71     Resp 07/06/21 1039 16     Temp 07/06/21 1039 97.8 F (36.6 C)     Temp Source 07/06/21 1039 Oral     SpO2 07/06/21 1039 96 %     Weight --      Height --      Head Circumference --      Peak Flow --      Pain Score 07/06/21 1040 0     Pain Loc --      Pain Edu? --      Excl. in [place]? --    No data found.  Updated Vital Signs BP (!) 156/90 (BP Location: Left Arm)    Pulse 71    Temp 97.8 F (36.6 C) (Oral)    Resp 16    SpO2 96%    Physical Exam Vitals and nursing note reviewed.  Constitutional:      General: She is not in acute distress.    Appearance: Normal appearance. She is normal weight. She is not ill-appearing.  HENT:     Head: Normocephalic and atraumatic.     Mouth/Throat:     Mouth: Mucous membranes are moist.     Pharynx: Oropharynx is clear.  Eyes:     Extraocular Movements: Extraocular movements intact.     Conjunctiva/sclera: Conjunctivae normal.     Pupils: Pupils are equal, round, and reactive to light.  Cardiovascular:     Rate and Rhythm: Normal rate and regular rhythm.     Pulses: Normal pulses.     Heart sounds: Normal heart sounds.  Pulmonary:     Effort: Pulmonary effort is normal.     Breath sounds: Normal breath sounds.  Musculoskeletal:     Cervical back: Normal range of motion and neck supple.  Skin:    General: Skin is warm and dry.  Neurological:     General: No focal deficit present.     Mental Status: She is alert and oriented to person, place, and time.     UC Treatments / Results  Labs (all labs ordered are listed, but only abnormal results are displayed) Labs Reviewed - No data to display  EKG   Radiology No results found.  Procedures Procedures (including critical care time)  Medications Ordered in UC Medications - No data to display  Initial Impression / Assessment and Plan / UC Course  I have reviewed the triage vital signs and the nursing  notes.  Pertinent labs & imaging results that were available during my care of the patient were reviewed by me  and considered in my medical decision making (see chart for details).     MDM: 1.  Hyperkalemia-patient's previous prescription written by [facility] PCP converted to liquid form per patient request. Advised patient to follow-up with PCP on Monday morning, 07/08/2021 to confirm new dosing of suspension Kayexalate. Advised/encouraged patient to call me back today or tomorrow, Sunday, 07/07/2021 if there is any issues with getting this medication filled. Final Clinical Impressions(s) / UC Diagnoses   Final diagnoses:  Hyperkalemia     Discharge Instructions      Advised patient to follow-up with PCP on Monday morning, 07/08/2021 to confirm new dosing of suspension Kayexalate.  Advised/encouraged patient to call me back today or tomorrow, Sunday, 07/07/2021 if there is any issues with getting this medication filled.     ED Prescriptions     Medication Sig Dispense Auth. Provider   sodium polystyrene (KAYEXALATE) 15 GM/60ML suspension Take 60 mLs (15 g total) by mouth once for 1 dose. 240 mL Eliezer Lofts, FNP      PDMP not reviewed this encounter.   Eliezer Lofts, [place] 07/06/21 1125

## 2021-07-06 NOTE — ED Triage Notes (Addendum)
Pt present medication change and would like a liquid form medication instead of the one she was prescribed

## 2021-07-06 NOTE — Discharge Instructions (Addendum)
Advised patient to follow-up with PCP on Monday morning, 07/08/2021 to confirm new dosing of suspension Kayexalate.  Advised/encouraged patient to call me back today or tomorrow, Sunday, 07/07/2021 if there is any issues with getting this medication filled.

## 2021-07-08 ENCOUNTER — Other Ambulatory Visit: Payer: BC Managed Care – PPO

## 2021-07-08 ENCOUNTER — Telehealth: Payer: Self-pay | Admitting: Family Medicine

## 2021-07-08 DIAGNOSIS — E875 Hyperkalemia: Secondary | ICD-10-CM | POA: Diagnosis not present

## 2021-07-08 LAB — CMP14+EGFR
ALT: 39 IU/L — ABNORMAL HIGH (ref 0–32)
AST: 21 IU/L (ref 0–40)
Albumin/Globulin Ratio: 2.2 (ref 1.2–2.2)
Albumin: 4.6 g/dL (ref 3.8–4.9)
Alkaline Phosphatase: 93 IU/L (ref 44–121)
BUN/Creatinine Ratio: 27 — ABNORMAL HIGH (ref 9–23)
BUN: 19 mg/dL (ref 6–24)
Bilirubin Total: 0.4 mg/dL (ref 0.0–1.2)
CO2: 22 mmol/L (ref 20–29)
Calcium: 9.7 mg/dL (ref 8.7–10.2)
Chloride: 105 mmol/L (ref 96–106)
Creatinine, Ser: 0.7 mg/dL (ref 0.57–1.00)
Globulin, Total: 2.1 g/dL (ref 1.5–4.5)
Glucose: 183 mg/dL — ABNORMAL HIGH (ref 70–99)
Potassium: 4.7 mmol/L (ref 3.5–5.2)
Sodium: 144 mmol/L (ref 134–144)
Total Protein: 6.7 g/dL (ref 6.0–8.5)
eGFR: 100 mL/min/{1.73_m2} (ref >59)

## 2021-07-08 NOTE — Telephone Encounter (Signed)
**  [facility] After Hours/ Emergency Line Call**  Patient: Peggy Hall .  PCP: Sharion Balloon, [facility] calling to confirm an order that was placed by PCP 5 days ago.  Apparently patient was seen at an urgent care over the weekend and a new Rx for Kayexalate was placed.  They wanted to confirm that they should proceed with new Rx placed by outside provider.  I encouraged them to proceed with new Rx as prescribed and to contact that provider should they have any further questions about their Rx.  Will forward to PCP.  Camila Maita M. Lajuana Ripple, DO

## 2021-07-16 ENCOUNTER — Other Ambulatory Visit: Payer: Self-pay | Admitting: Family

## 2021-07-18 DIAGNOSIS — Z029 Encounter for administrative examinations, unspecified: Secondary | ICD-10-CM

## 2021-07-30 ENCOUNTER — Ambulatory Visit: Payer: BC Managed Care – PPO | Admitting: Pharmacist

## 2021-08-01 ENCOUNTER — Ambulatory Visit (INDEPENDENT_AMBULATORY_CARE_PROVIDER_SITE_OTHER): Payer: BC Managed Care – PPO | Admitting: Pharmacist

## 2021-08-01 DIAGNOSIS — E1169 Type 2 diabetes mellitus with other specified complication: Secondary | ICD-10-CM | POA: Diagnosis not present

## 2021-08-01 DIAGNOSIS — G5602 Carpal tunnel syndrome, left upper limb: Secondary | ICD-10-CM | POA: Diagnosis not present

## 2021-08-01 DIAGNOSIS — M79642 Pain in left hand: Secondary | ICD-10-CM | POA: Diagnosis not present

## 2021-08-01 NOTE — Progress Notes (Signed)
° ° °  08/01/2021 Name: Peggy Hall MRN: [id number] DOB: 1960/11/19   S:  61 yoF Presents for diabetes evaluation, education, and management. Patient was referred and last seen by Primary Care Provider on 07/03/21.  Insurance coverage/medication affordability: BCBS  Patient reports adherence with medications. Current diabetes medications include: XIGDUO , TRULICITY Current hypertension medications include: lisinopril Goal 130/80 Current hyperlipidemia medications include: atorvastatin LDL 62 12/19/19   Patient denies hypoglycemic events.   Patient reported dietary habits: Eats 3 meals/day  The patient is asked to make an attempt to improve diet and exercise patterns to aid in medical management of this problem.  Patient-reported exercise habits: walks/does incorporate activity  O:  Lab Results  Component Value Date   HGBA1C 6.4 (H) 07/03/2021   Lipid Panel     Component Value Date/Time   CHOL 142 12/19/2019 1546   TRIG 296 (H) 12/19/2019 1546   HDL 33 (L) 12/19/2019 1546   CHOLHDL 4.3 12/19/2019 1546   LDLCALC 62 12/19/2019 1546   LDLDIRECT 122 (H) 11/07/2016 1351    Home fasting blood sugars: <130  2 hour post-meal/random blood sugars: <180.   Clinical Atherosclerotic Cardiovascular Disease (ASCVD): No   The 10-year ASCVD risk score (Arnett DK, et al., 2019) is: 12.1%   Values used to calculate the score:     Age: 61 years     Sex: Female     Is Non-Hispanic African American: No     Diabetic: Yes     Tobacco smoker: No     Systolic Blood Pressure: 643 mmHg     Is BP treated: Yes     HDL Cholesterol: 33 mg/dL     Total Cholesterol: 142 mg/dL    A/P:  Diabetes T2DM currently CONTROLLED. Patient is adherent with medication.   -Continue Trulicity 0.75mg  sq weekly  Denies personal and family history of Medullary thyroid cancer (MTC)  Consider increase trulicity to 1.5mg  sq weekly at f/u to aid in fatty liver--more data around that  -Continue Xigduo XR 5/1000mg   (2 tabs qAM)  -Discussed dietary restrictions for fatty liver that go along with diabetic diet/cholesterol diet  Handouts left up front for patient to pick up  Encouraged exercise, 5-10 lb weight loss per GI notes  -Extensively discussed pathophysiology of diabetes, recommended lifestyle interventions, dietary effects on blood sugar control  -Counseled on s/sx of and management of hypoglycemia  -Next A1C anticipated 6 months.  Written patient instructions provided.  Total time in counseling 24 minutes.     Regina Eck, PharmD, BCPS Clinical Pharmacist, [place]  [facility] Phone [phone]

## 2021-08-12 DIAGNOSIS — R200 Anesthesia of skin: Secondary | ICD-10-CM | POA: Diagnosis not present

## 2021-08-12 DIAGNOSIS — R202 Paresthesia of skin: Secondary | ICD-10-CM | POA: Diagnosis not present

## 2021-08-13 ENCOUNTER — Other Ambulatory Visit: Payer: Self-pay | Admitting: Family

## 2021-08-13 DIAGNOSIS — E785 Hyperlipidemia, unspecified: Secondary | ICD-10-CM

## 2021-08-13 DIAGNOSIS — E1169 Type 2 diabetes mellitus with other specified complication: Secondary | ICD-10-CM

## 2021-09-02 DIAGNOSIS — Z791 Long term (current) use of non-steroidal anti-inflammatories (NSAID): Secondary | ICD-10-CM | POA: Diagnosis not present

## 2021-09-02 DIAGNOSIS — M150 Primary generalized (osteo)arthritis: Secondary | ICD-10-CM | POA: Diagnosis not present

## 2021-09-03 DIAGNOSIS — R202 Paresthesia of skin: Secondary | ICD-10-CM | POA: Diagnosis not present

## 2021-09-03 DIAGNOSIS — R200 Anesthesia of skin: Secondary | ICD-10-CM | POA: Diagnosis not present

## 2021-09-09 ENCOUNTER — Ambulatory Visit: Payer: BC Managed Care – PPO | Admitting: Family

## 2021-09-09 ENCOUNTER — Encounter: Payer: Self-pay | Admitting: Family

## 2021-09-09 VITALS — BP 145/72 | HR 67 | Temp 97.4°F | Ht 64.0 in | Wt 160.0 lb

## 2021-09-09 DIAGNOSIS — K760 Fatty (change of) liver, not elsewhere classified: Secondary | ICD-10-CM

## 2021-09-09 DIAGNOSIS — G4700 Insomnia, unspecified: Secondary | ICD-10-CM

## 2021-09-09 DIAGNOSIS — R197 Diarrhea, unspecified: Secondary | ICD-10-CM

## 2021-09-09 DIAGNOSIS — I10 Essential (primary) hypertension: Secondary | ICD-10-CM | POA: Diagnosis not present

## 2021-09-09 DIAGNOSIS — F411 Generalized anxiety disorder: Secondary | ICD-10-CM

## 2021-09-09 DIAGNOSIS — E1169 Type 2 diabetes mellitus with other specified complication: Secondary | ICD-10-CM | POA: Diagnosis not present

## 2021-09-09 DIAGNOSIS — Z23 Encounter for immunization: Secondary | ICD-10-CM

## 2021-09-09 DIAGNOSIS — E785 Hyperlipidemia, unspecified: Secondary | ICD-10-CM

## 2021-09-09 DIAGNOSIS — F32A Depression, unspecified: Secondary | ICD-10-CM | POA: Diagnosis not present

## 2021-09-09 DIAGNOSIS — M797 Fibromyalgia: Secondary | ICD-10-CM

## 2021-09-09 MED ORDER — DULOXETINE HCL 30 MG PO CPEP: ORAL_CAPSULE | ORAL | 0 refills | Status: DC

## 2021-09-09 MED ORDER — DULOXETINE HCL 60 MG PO CPEP: 60.0000 mg | ORAL_CAPSULE | Freq: Every day | ORAL | 1 refills | Status: DC

## 2021-09-09 MED ORDER — QUETIAPINE FUMARATE 50 MG PO TABS: 50.0000 mg | ORAL_TABLET | Freq: Every day | ORAL | 3 refills | Status: DC

## 2021-09-09 MED ORDER — TRAZODONE HCL 100 MG PO TABS: 100.0000 mg | ORAL_TABLET | Freq: Every day | ORAL | 2 refills | Status: DC

## 2021-09-09 NOTE — Patient Instructions (Signed)

## 2021-09-09 NOTE — Progress Notes (Signed)
? ?Subjective:  ? ? Patient ID: Peggy Hall, female    DOB: April 09, 1961, 61 y.o.   MRN: [id number] ? ?Chief Complaint  ?Patient presents with  ? Medical Management of Chronic Issues  ?  Sleeping is an issues right now.   ? Fibromyalgia  ?  In pain   ? ?Pt presents to the office today for chronic follow up. She is followed by Rheumatologists every 3 months for Fibromyalgia. She was started on Sulindac 200 mg BID. She states this has not been helping and her pain is 7 out 10. She continues to have bilateral shoulders, knee, and back pain that is constant dull ache. ?  ?She has hepatic steatosis.  ?Hypertension ?This is a chronic problem. The current episode started more than 1 year ago. The problem has been resolved since onset. The problem is controlled. Associated symptoms include anxiety and malaise/fatigue. Pertinent negatives include no blurred vision, peripheral edema or shortness of breath. Risk factors for coronary artery disease include dyslipidemia, obesity and sedentary lifestyle. The current treatment provides moderate improvement.  ?Diabetes ?She presents for her follow-up diabetic visit. She has type 2 diabetes mellitus. Hypoglycemia symptoms include nervousness/anxiousness. Pertinent negatives for hypoglycemia include no confusion. Associated symptoms include fatigue and foot paresthesias. Pertinent negatives for diabetes include no blurred vision. Symptoms are stable. Diabetic complications include peripheral neuropathy. Pertinent negatives for diabetic complications include no heart disease or nephropathy. Risk factors for coronary artery disease include dyslipidemia, diabetes mellitus, hypertension, sedentary lifestyle and post-menopausal. She is following a generally unhealthy diet. Her overall blood glucose range is 180-200 mg/dl. An ACE inhibitor/angiotensin II receptor blocker is being taken. Eye exam is not current.  ?Hyperlipidemia ?This is a chronic problem. The current episode started more than  1 year ago. The problem is controlled. Recent lipid tests were reviewed and are normal. Exacerbating diseases include obesity. Pertinent negatives include no shortness of breath. Current antihyperlipidemic treatment includes statins. The current treatment provides moderate improvement of lipids. Risk factors for coronary artery disease include dyslipidemia, diabetes mellitus, hypertension, a sedentary lifestyle and post-menopausal.  ?Anxiety ?Presents for follow-up visit. Symptoms include depressed mood, excessive worry, insomnia, irritability and nervous/anxious behavior. Patient reports no confusion or shortness of breath. Symptoms occur most days. The severity of symptoms is moderate.  ? ? ?Depression ?       This is a chronic problem.  The current episode started more than 1 year ago.   The problem occurs intermittently.  The problem has been waxing and waning since onset.  Associated symptoms include fatigue, helplessness, hopelessness, insomnia, irritable and sad.  Past treatments include SSRIs - Selective serotonin reuptake inhibitors.  Compliance with treatment is good.  Past medical history includes anxiety.   ?Insomnia ?Primary symptoms: difficulty falling asleep, frequent awakening, malaise/fatigue.   ?The current episode started more than one year. The onset quality is gradual. The problem occurs intermittently. The treatment provided moderate relief. PMH includes: depression.   ?Diarrhea  ?This is a chronic problem. The current episode started more than 1 year ago. The problem occurs 2 to 4 times per day. The problem has been gradually worsening. She has tried anti-motility drug for the symptoms. The treatment provided mild relief.  ? ? ? ?Review of Systems  ?Constitutional:  Positive for fatigue, irritability and malaise/fatigue.  ?Eyes:  Negative for blurred vision.  ?Respiratory:  Negative for shortness of breath.   ?Gastrointestinal:  Positive for diarrhea.  ?Psychiatric/Behavioral:  Positive for  depression. Negative for confusion. The patient is  nervous/anxious and has insomnia.   ?All other systems reviewed and are negative. ? ?   ?Objective:  ? Physical Exam ?Vitals reviewed.  ?Constitutional:   ?   General: She is irritable. She is not in acute distress. ?   Appearance: She is well-developed. She is obese.  ?HENT:  ?   Head: Normocephalic and atraumatic.  ?   Right Ear: Tympanic membrane normal.  ?   Left Ear: Tympanic membrane normal.  ?Eyes:  ?   Pupils: Pupils are equal, round, and reactive to light.  ?Neck:  ?   Thyroid: No thyromegaly.  ?Cardiovascular:  ?   Rate and Rhythm: Normal rate and regular rhythm.  ?   Heart sounds: Normal heart sounds. No murmur heard. ?Pulmonary:  ?   Effort: Pulmonary effort is normal. No respiratory distress.  ?   Breath sounds: Normal breath sounds. No wheezing.  ?Abdominal:  ?   General: Bowel sounds are normal. There is no distension.  ?   Palpations: Abdomen is soft.  ?   Tenderness: There is no abdominal tenderness.  ?Musculoskeletal:     ?   General: No tenderness.  ?   Cervical back: Normal range of motion and neck supple.  ?   Comments: Pain left wrist with flexion  ?Skin: ?   General: Skin is warm and dry.  ?Neurological:  ?   Mental Status: She is alert and oriented to person, place, and time.  ?   Cranial Nerves: No cranial nerve deficit.  ?   Deep Tendon Reflexes: Reflexes are normal and symmetric.  ?Psychiatric:     ?   Behavior: Behavior normal.     ?   Thought Content: Thought content normal.     ?   Judgment: Judgment normal.  ? ? ? ? ?BP (!) 145/72   Pulse 67   Temp (!) 97.4 ?F (36.3 ?C) (Temporal)   Ht '5\' 4"'$  (1.626 m)   Wt 160 lb (72.6 kg)   BMI 27.46 kg/m?  ? ?   ?Assessment & Plan:  ?Khristian Seals comes in today with chief complaint of Medical Management of Chronic Issues (Sleeping is an issues right now. ) and Fibromyalgia (In pain ) ? ? ?Diagnosis and orders addressed: ? ?1. Depression, unspecified depression type ?Will stop zoloft and start  Cymbalt 30 mg then after two weeks increase to 60 mg  ?Stress management  ?- QUEtiapine (SEROQUEL) 50 MG tablet; Take 1 tablet (50 mg total) by mouth at bedtime.  Dispense: 90 tablet; Refill: 3 ?- DULoxetine (CYMBALTA) 30 MG capsule; Take 1 capsule (30 mg total) by mouth daily for 14 days, THEN 2 capsules (60 mg total) daily for 14 days.  Dispense: 42 capsule; Refill: 0 ?- DULoxetine (CYMBALTA) 60 MG capsule; Take 1 capsule (60 mg total) by mouth daily.  Dispense: 90 capsule; Refill: 1 ? ?2. Essential hypertension ? ?3. Hepatic steatosis ? ?4. Hyperlipidemia associated with type 2 diabetes mellitus ([place]) ? ?5. Type 2 diabetes mellitus with other specified complication, without long-term current use of insulin ([place]) ? ?6. GAD (generalized anxiety disorder) ?- DULoxetine (CYMBALTA) 30 MG capsule; Take 1 capsule (30 mg total) by mouth daily for 14 days, THEN 2 capsules (60 mg total) daily for 14 days.  Dispense: 42 capsule; Refill: 0 ?- DULoxetine (CYMBALTA) 60 MG capsule; Take 1 capsule (60 mg total) by mouth daily.  Dispense: 90 capsule; Refill: 1 ? ?7. Fibromyalgia ? ?8. Hyperlipidemia, unspecified hyperlipidemia type ? ?9. Insomnia, unspecified  type ?Will increase Trazodone to 100-150 mg nightly  ?Sleep ritual   ? ? ?Labs pending ?Health Maintenance reviewed ?Diet and exercise encouraged ? ?Follow up plan: ?3 months  ? ? ?Evelina Dun, FNP ? ? ? ?

## 2021-09-10 DIAGNOSIS — R200 Anesthesia of skin: Secondary | ICD-10-CM | POA: Diagnosis not present

## 2021-09-10 DIAGNOSIS — R202 Paresthesia of skin: Secondary | ICD-10-CM | POA: Diagnosis not present

## 2021-09-17 DIAGNOSIS — G5602 Carpal tunnel syndrome, left upper limb: Secondary | ICD-10-CM | POA: Diagnosis not present

## 2021-10-01 DIAGNOSIS — M150 Primary generalized (osteo)arthritis: Secondary | ICD-10-CM | POA: Diagnosis not present

## 2021-10-01 DIAGNOSIS — M797 Fibromyalgia: Secondary | ICD-10-CM | POA: Diagnosis not present

## 2021-10-08 ENCOUNTER — Encounter: Payer: Self-pay | Admitting: Family

## 2021-10-08 ENCOUNTER — Ambulatory Visit: Payer: BC Managed Care – PPO | Admitting: Family

## 2021-10-08 VITALS — BP 149/79 | HR 70 | Temp 98.2°F | Ht 64.0 in | Wt 158.0 lb

## 2021-10-08 DIAGNOSIS — E1129 Type 2 diabetes mellitus with other diabetic kidney complication: Secondary | ICD-10-CM | POA: Diagnosis not present

## 2021-10-08 DIAGNOSIS — G4700 Insomnia, unspecified: Secondary | ICD-10-CM

## 2021-10-08 DIAGNOSIS — K760 Fatty (change of) liver, not elsewhere classified: Secondary | ICD-10-CM

## 2021-10-08 DIAGNOSIS — I10 Essential (primary) hypertension: Secondary | ICD-10-CM | POA: Diagnosis not present

## 2021-10-08 DIAGNOSIS — R809 Proteinuria, unspecified: Secondary | ICD-10-CM | POA: Diagnosis not present

## 2021-10-08 DIAGNOSIS — F32A Depression, unspecified: Secondary | ICD-10-CM

## 2021-10-08 DIAGNOSIS — F411 Generalized anxiety disorder: Secondary | ICD-10-CM

## 2021-10-08 DIAGNOSIS — E1169 Type 2 diabetes mellitus with other specified complication: Secondary | ICD-10-CM

## 2021-10-08 DIAGNOSIS — E785 Hyperlipidemia, unspecified: Secondary | ICD-10-CM

## 2021-10-08 DIAGNOSIS — M797 Fibromyalgia: Secondary | ICD-10-CM

## 2021-10-08 LAB — BAYER DCA HB A1C WAIVED: HB A1C (BAYER DCA - WAIVED): 7 % — ABNORMAL HIGH (ref 4.8–5.6)

## 2021-10-08 MED ORDER — XIGDUO XR 5-1000 MG PO TB24: 2.0000 | ORAL_TABLET | ORAL | 3 refills | Status: DC

## 2021-10-08 MED ORDER — DULOXETINE HCL 60 MG PO CPEP: 60.0000 mg | ORAL_CAPSULE | Freq: Every day | ORAL | 1 refills | Status: DC

## 2021-10-08 MED ORDER — LISINOPRIL 20 MG PO TABS: 20.0000 mg | ORAL_TABLET | Freq: Every day | ORAL | 3 refills | Status: DC

## 2021-10-08 NOTE — Progress Notes (Signed)
? ?Subjective:  ? ? Patient ID: Peggy Hall, female    DOB: 18-Jun-1961, 61 y.o.   MRN: [id number] ? ?Chief Complaint  ?Patient presents with  ? Medical Management of Chronic Issues  ? ?Pt presents to the office today for chronic follow up. She is followed by Rheumatologists every 3 months for Fibromyalgia. She was started on Sulindac 200 mg BID. She started Cymbalta 60 mg and states this has greatly helped. She continues to have bilateral shoulders, knee, and back pain that has improved.  ?  ?She has hepatic steatosis.  ?Hypertension ?This is a chronic problem. The current episode started more than 1 year ago. The problem has been waxing and waning since onset. The problem is uncontrolled. Associated symptoms include anxiety and malaise/fatigue. Pertinent negatives include no blurred vision, peripheral edema or shortness of breath. Risk factors for coronary artery disease include dyslipidemia, diabetes mellitus and sedentary lifestyle. The current treatment provides mild improvement.  ?Diabetes ?She presents for her follow-up diabetic visit. She has type 2 diabetes mellitus. Hypoglycemia symptoms include nervousness/anxiousness. Pertinent negatives for diabetes include no blurred vision and no foot paresthesias. Diabetic complications include peripheral neuropathy. Risk factors for coronary artery disease include dyslipidemia, diabetes mellitus, hypertension, sedentary lifestyle and post-menopausal. She is following a generally unhealthy diet. Her overall blood glucose range is 140-180 mg/dl. Eye exam is current.  ?Hyperlipidemia ?This is a chronic problem. The current episode started more than 1 year ago. The problem is controlled. Recent lipid tests were reviewed and are normal. Exacerbating diseases include obesity. Pertinent negatives include no shortness of breath. Current antihyperlipidemic treatment includes statins. The current treatment provides moderate improvement of lipids. Risk factors for coronary  artery disease include dyslipidemia, diabetes mellitus, hypertension, a sedentary lifestyle and post-menopausal.  ?Insomnia ?Primary symptoms: difficulty falling asleep, frequent awakening, malaise/fatigue.   ?The current episode started more than one year. The onset quality is gradual. PMH includes: depression.   ?Anxiety ?Presents for follow-up visit. Symptoms include depressed mood, excessive worry, insomnia, irritability, nervous/anxious behavior and restlessness. Patient reports no shortness of breath. Symptoms occur occasionally. The severity of symptoms is moderate.  ? ? ?Depression ?       Associated symptoms include insomnia and restlessness.  Past treatments include SNRIs - Serotonin and norepinephrine reuptake inhibitors.  Past medical history includes anxiety.   ? ? ? ?Review of Systems  ?Constitutional:  Positive for irritability and malaise/fatigue.  ?Eyes:  Negative for blurred vision.  ?Respiratory:  Negative for shortness of breath.   ?Psychiatric/Behavioral:  Positive for depression. The patient is nervous/anxious and has insomnia.   ?All other systems reviewed and are negative. ? ?   ?Objective:  ? Physical Exam ?Vitals reviewed.  ?Constitutional:   ?   General: She is not in acute distress. ?   Appearance: She is well-developed. She is obese.  ?HENT:  ?   Head: Normocephalic and atraumatic.  ?   Right Ear: Tympanic membrane normal.  ?   Left Ear: Tympanic membrane normal.  ?Eyes:  ?   Pupils: Pupils are equal, round, and reactive to light.  ?Neck:  ?   Thyroid: No thyromegaly.  ?Cardiovascular:  ?   Rate and Rhythm: Normal rate and regular rhythm.  ?   Heart sounds: Normal heart sounds. No murmur heard. ?Pulmonary:  ?   Effort: Pulmonary effort is normal. No respiratory distress.  ?   Breath sounds: Normal breath sounds. No wheezing.  ?Abdominal:  ?   General: Bowel sounds are normal. There  is no distension.  ?   Palpations: Abdomen is soft.  ?   Tenderness: There is no abdominal tenderness.   ?Musculoskeletal:     ?   General: No tenderness. Normal range of motion.  ?   Cervical back: Normal range of motion and neck supple.  ?Skin: ?   General: Skin is warm and dry.  ?Neurological:  ?   Mental Status: She is alert and oriented to person, place, and time.  ?   Cranial Nerves: No cranial nerve deficit.  ?   Deep Tendon Reflexes: Reflexes are normal and symmetric.  ?Psychiatric:     ?   Behavior: Behavior normal.     ?   Thought Content: Thought content normal.     ?   Judgment: Judgment normal.  ? ? ? ? ?BP (!) 149/79   Pulse 70   Temp 98.2 ?F (36.8 ?C) (Temporal)   Ht _0  (1.626 m)   Wt 158 lb (71.7 kg)   BMI 27.12 kg/m?  ? ?   ?Assessment & Plan:  ?Peggy Hall comes in today with chief complaint of Medical Management of Chronic Issues ? ? ?Diagnosis and orders addressed: ? ?1. Depression, unspecified depression type ?- DULoxetine (CYMBALTA) 60 MG capsule; Take 1 capsule (60 mg total) by mouth daily.  Dispense: 90 capsule; Refill: 1 ?- CMP14+EGFR ?- CBC with Differential/Platelet ? ?2. GAD (generalized anxiety disorder) ?- DULoxetine (CYMBALTA) 60 MG capsule; Take 1 capsule (60 mg total) by mouth daily.  Dispense: 90 capsule; Refill: 1 ?- CMP14+EGFR ?- CBC with Differential/Platelet ? ?3. Type 2 diabetes mellitus with microalbuminuria, without long-term current use of insulin ([place]) ?- Dapagliflozin-metFORMIN HCl ER (XIGDUO XR) 10-998 MG TB24; Take 2 tablets by mouth every morning.  Dispense: 180 tablet; Refill: 3 ?- Microalbumin / creatinine urine ratio ?- Bayer DCA Hb A1c Waived ?- CMP14+EGFR ?- CBC with Differential/Platelet ?- lisinopril (ZESTRIL) 20 MG tablet; Take 1 tablet (20 mg total) by mouth daily.  Dispense: 90 tablet; Refill: 3 ? ?4. Essential hypertension ?-Pt started on Lisinopril 20 mg  ?-Daily blood pressure log given with instructions on how to fill out and told to bring to next visit ?-Dash diet information given ?-Exercise encouraged ?- Stress Management  ?-Continue current  meds ?-RTO in 1 month   ?- CMP14+EGFR ?- CBC with Differential/Platelet ? ?5. Hepatic steatosis ?- CMP14+EGFR ?- CBC with Differential/Platelet ? ?6. Hyperlipidemia associated with type 2 diabetes mellitus ([place]) ?- CMP14+EGFR ?- CBC with Differential/Platelet ? ?7. Fibromyalgia ? ?- DULoxetine (CYMBALTA) 60 MG capsule; Take 1 capsule (60 mg total) by mouth daily.  Dispense: 90 capsule; Refill: 1 ?- CMP14+EGFR ?- CBC with Differential/Platelet ? ?8. Insomnia, unspecified type ?- DULoxetine (CYMBALTA) 60 MG capsule; Take 1 capsule (60 mg total) by mouth daily.  Dispense: 90 capsule; Refill: 1 ?- CMP14+EGFR ?- CBC with Differential/Platelet ? ?9. Hyperlipidemia, unspecified hyperlipidemia type ?- CMP14+EGFR ?- CBC with Differential/Platelet ? ? ?Labs pending ?Health Maintenance reviewed ?Diet and exercise encouraged ? ?Follow up plan: ?1 month to recheck HTN ? ?Evelina Dun, FNP ? ? ?

## 2021-10-08 NOTE — Patient Instructions (Signed)
Hypertension, Adult High blood pressure (hypertension) is when the force of blood pumping through the arteries is too strong. The arteries are the blood vessels that carry blood from the heart throughout the body. Hypertension forces the heart to work harder to pump blood and may cause arteries to become narrow or stiff. Untreated or uncontrolled hypertension can lead to a heart attack, heart failure, a stroke, kidney disease, and other problems. A blood pressure reading consists of a higher number over a lower number. Ideally, your blood pressure should be below 120/80. The first ("top") number is called the systolic pressure. It is a measure of the pressure in your arteries as your heart beats. The second ("bottom") number is called the diastolic pressure. It is a measure of the pressure in your arteries as the heart relaxes. What are the causes? The exact cause of this condition is not known. There are some conditions that result in high blood pressure. What increases the risk? Certain factors may make you more likely to develop high blood pressure. Some of these risk factors are under your control, including: Smoking. Not getting enough exercise or physical activity. Being overweight. Having too much fat, sugar, calories, or salt (sodium) in your diet. Drinking too much alcohol. Other risk factors include: Having a personal history of heart disease, diabetes, high cholesterol, or kidney disease. Stress. Having a family history of high blood pressure and high cholesterol. Having obstructive sleep apnea. Age. The risk increases with age. What are the signs or symptoms? High blood pressure may not cause symptoms. Very high blood pressure (hypertensive crisis) may cause: Headache. Fast or irregular heartbeats (palpitations). Shortness of breath. Nosebleed. Nausea and vomiting. Vision changes. Severe chest pain, dizziness, and seizures. How is this diagnosed? This condition is diagnosed by  measuring your blood pressure while you are seated, with your arm resting on a flat surface, your legs uncrossed, and your feet flat on the floor. The cuff of the blood pressure monitor will be placed directly against the skin of your upper arm at the level of your heart. Blood pressure should be measured at least twice using the same arm. Certain conditions can cause a difference in blood pressure between your right and left arms. If you have a high blood pressure reading during one visit or you have normal blood pressure with other risk factors, you may be asked to: Return on a different day to have your blood pressure checked again. Monitor your blood pressure at home for 1 week or longer. If you are diagnosed with hypertension, you may have other blood or imaging tests to help your health care provider understand your overall risk for other conditions. How is this treated? This condition is treated by making healthy lifestyle changes, such as eating healthy foods, exercising more, and reducing your alcohol intake. You may be referred for counseling on a healthy diet and physical activity. Your health care provider may prescribe medicine if lifestyle changes are not enough to get your blood pressure under control and if: Your systolic blood pressure is above 130. Your diastolic blood pressure is above 80. Your personal target blood pressure may vary depending on your medical conditions, your age, and other factors. Follow these instructions at home: Eating and drinking  Eat a diet that is high in fiber and potassium, and low in sodium, added sugar, and fat. An example of this eating plan is called the DASH diet. DASH stands for Dietary Approaches to Stop Hypertension. To eat this way: Eat   plenty of fresh fruits and vegetables. Try to fill one half of your plate at each meal with fruits and vegetables. Eat whole grains, such as whole-wheat pasta, brown rice, or whole-grain bread. Fill about one  fourth of your plate with whole grains. Eat or drink low-fat dairy products, such as skim milk or low-fat yogurt. Avoid fatty cuts of meat, processed or cured meats, and poultry with skin. Fill about one fourth of your plate with lean proteins, such as fish, chicken without skin, beans, eggs, or tofu. Avoid pre-made and processed foods. These tend to be higher in sodium, added sugar, and fat. Reduce your daily sodium intake. Many people with hypertension should eat less than 1,500 mg of sodium a day. Do not drink alcohol if: Your health care provider tells you not to drink. You are pregnant, may be pregnant, or are planning to become pregnant. If you drink alcohol: Limit how much you have to: 0-1 drink a day for women. 0-2 drinks a day for men. Know how much alcohol is in your drink. In the [place] one drink equals one 12 oz bottle of beer (355 mL), one 5 oz glass of wine (148 mL), or one 1 oz glass of hard liquor (44 mL). Lifestyle  Work with your health care provider to maintain a healthy body weight or to lose weight. Ask what an ideal weight is for you. Get at least 30 minutes of exercise that causes your heart to beat faster (aerobic exercise) most days of the week. Activities may include walking, swimming, or biking. Include exercise to strengthen your muscles (resistance exercise), such as Pilates or lifting weights, as part of your weekly exercise routine. Try to do these types of exercises for 30 minutes at least 3 days a week. Do not use any products that contain nicotine or tobacco. These products include cigarettes, chewing tobacco, and vaping devices, such as e-cigarettes. If you need help quitting, ask your health care provider. Monitor your blood pressure at home as told by your health care provider. Keep all follow-up visits. This is important. Medicines Take over-the-counter and prescription medicines only as told by your health care provider. Follow directions carefully. Blood  pressure medicines must be taken as prescribed. Do not skip doses of blood pressure medicine. Doing this puts you at risk for problems and can make the medicine less effective. Ask your health care provider about side effects or reactions to medicines that you should watch for. Contact a health care provider if you: Think you are having a reaction to a medicine you are taking. Have headaches that keep coming back (recurring). Feel dizzy. Have swelling in your ankles. Have trouble with your vision. Get help right away if you: Develop a severe headache or confusion. Have unusual weakness or numbness. Feel faint. Have severe pain in your chest or abdomen. Vomit repeatedly. Have trouble breathing. These symptoms may be an emergency. Get help right away. Call 911. Do not wait to see if the symptoms will go away. Do not drive yourself to the hospital. Summary Hypertension is when the force of blood pumping through your arteries is too strong. If this condition is not controlled, it may put you at risk for serious complications. Your personal target blood pressure may vary depending on your medical conditions, your age, and other factors. For most people, a normal blood pressure is less than 120/80. Hypertension is treated with lifestyle changes, medicines, or a combination of both. Lifestyle changes include losing weight, eating a healthy,   low-sodium diet, exercising more, and limiting alcohol. This information is not intended to replace advice given to you by your health care provider. Make sure you discuss any questions you have with your health care provider. Document Revised: 04/16/2021 Document Reviewed: 04/16/2021 Elsevier Patient Education  2023 Elsevier Inc.  

## 2021-10-09 ENCOUNTER — Telehealth: Payer: Self-pay | Admitting: *Deleted

## 2021-10-09 LAB — CBC WITH DIFFERENTIAL/PLATELET
Basophils Absolute: 0.1 10*3/uL (ref 0.0–0.2)
Basos: 1 %
EOS (ABSOLUTE): 0.5 10*3/uL — ABNORMAL HIGH (ref 0.0–0.4)
Eos: 6 %
Hematocrit: 42.3 % (ref 34.0–46.6)
Hemoglobin: 13.9 g/dL (ref 11.1–15.9)
Immature Grans (Abs): 0.1 10*3/uL (ref 0.0–0.1)
Immature Granulocytes: 1 %
Lymphocytes Absolute: 2 10*3/uL (ref 0.7–3.1)
Lymphs: 22 %
MCH: 29.3 pg (ref 26.6–33.0)
MCHC: 32.9 g/dL (ref 31.5–35.7)
MCV: 89 fL (ref 79–97)
Monocytes Absolute: 0.8 10*3/uL (ref 0.1–0.9)
Monocytes: 9 %
Neutrophils Absolute: 5.8 10*3/uL (ref 1.4–7.0)
Neutrophils: 61 %
Platelets: 321 10*3/uL (ref 150–450)
RBC: 4.75 x10E6/uL (ref 3.77–5.28)
RDW: 11.8 % (ref 11.7–15.4)
WBC: 9.2 10*3/uL (ref 3.4–10.8)

## 2021-10-09 LAB — CMP14+EGFR
ALT: 27 IU/L (ref 0–32)
AST: 17 IU/L (ref 0–40)
Albumin/Globulin Ratio: 1.9 (ref 1.2–2.2)
Albumin: 4.2 g/dL (ref 3.8–4.9)
Alkaline Phosphatase: 119 IU/L (ref 44–121)
BUN/Creatinine Ratio: 17 (ref 9–23)
BUN: 11 mg/dL (ref 6–24)
Bilirubin Total: 0.4 mg/dL (ref 0.0–1.2)
CO2: 28 mmol/L (ref 20–29)
Calcium: 9.9 mg/dL (ref 8.7–10.2)
Chloride: 102 mmol/L (ref 96–106)
Creatinine, Ser: 0.64 mg/dL (ref 0.57–1.00)
Globulin, Total: 2.2 g/dL (ref 1.5–4.5)
Glucose: 177 mg/dL — ABNORMAL HIGH (ref 70–99)
Potassium: 5.3 mmol/L — ABNORMAL HIGH (ref 3.5–5.2)
Sodium: 142 mmol/L (ref 134–144)
Total Protein: 6.4 g/dL (ref 6.0–8.5)
eGFR: 102 mL/min/1.73

## 2021-10-09 LAB — MICROALBUMIN / CREATININE URINE RATIO
Creatinine, Urine: 54.7 mg/dL
Microalb/Creat Ratio: 10 mg/g creat (ref 0–29)
Microalbumin, Urine: 5.2 ug/mL

## 2021-10-09 NOTE — Telephone Encounter (Signed)
Xigduo XR 5-'1000MG'$  er tablets PA came over from [place] ? ?Form was a call-in form: ph # [phone] ?[employer] ? ?Called and plan stated that this should be covered - she would override the stop and that the pharm could re-run this today and pt can fill today. ? ?I called the pharm and they stated that YES, it went through, but ? ?It is $175.00 for 90 days  ?And only  ?$15.00 for a 30 day supply. ? ? ?WM aware to fill every 30 days and can discuss with Lenna Gilford if there are any issues.  ? ? ? ?

## 2021-10-17 ENCOUNTER — Other Ambulatory Visit: Payer: Self-pay | Admitting: Family

## 2021-10-19 ENCOUNTER — Telehealth: Payer: BC Managed Care – PPO | Admitting: Physician Assistant

## 2021-10-19 DIAGNOSIS — H1033 Unspecified acute conjunctivitis, bilateral: Secondary | ICD-10-CM | POA: Diagnosis not present

## 2021-10-19 MED ORDER — POLYMYXIN B-TRIMETHOPRIM 10000-0.1 UNIT/ML-% OP SOLN: 1.0000 [drp] | Freq: Four times a day (QID) | OPHTHALMIC | 0 refills | Status: AC

## 2021-10-19 NOTE — Progress Notes (Signed)
E-Visit for Pink Eye ? ? ?We are sorry that you are not feeling well.  Here is how we plan to help! ? ?Based on what you have shared with me it looks like you have conjunctivitis.  Conjunctivitis is a common inflammatory or infectious condition of the eye that is often referred to as "pink eye".  In most cases it is contagious (viral or bacterial). However, not all conjunctivitis requires antibiotics (ex. Allergic).  We have made appropriate suggestions for you based upon your presentation. ? ?I have prescribed Polytrim Ophthalmic drops 1-2 drops 4 times a day times 5 days ? ?Pink eye can be highly contagious.  It is typically spread through direct contact with secretions, or contaminated objects or surfaces that one may have touched.  Strict handwashing is suggested with soap and water is urged.  If not available, use alcohol based had sanitizer.  Avoid unnecessary touching of the eye.  If you wear contact lenses, you will need to refrain from wearing them until you see no white discharge from the eye for at least 24 hours after being on medication.  You should see symptom improvement in 1-2 days after starting the medication regimen.  Call us if symptoms are not improved in 1-2 days. ? ?Home Care: ?Wash your hands often! ?Do not wear your contacts until you complete your treatment plan. ?Avoid sharing towels, bed linen, personal items with a person who has pink eye. ?See attention for anyone in your home with similar symptoms. ? ?Get Help Right Away If: ?Your symptoms do not improve. ?You develop blurred or loss of vision. ?Your symptoms worsen (increased discharge, pain or redness) ? ? ?Thank you for choosing an e-visit. ? ?Your e-visit answers were reviewed by a board certified advanced clinical practitioner to complete your personal care plan. Depending upon the condition, your plan could have included both over the counter or prescription medications. ? ?Please review your pharmacy choice. Make sure the  pharmacy is open so you can pick up prescription now. If there is a problem, you may contact your provider through [employer] and have the prescription routed to another pharmacy.  Your safety is important to [place]. If you have drug allergies check your prescription carefully.  ? ?For the next 24 hours you can use MyChart to ask questions about today's visit, request a non-urgent call back, or ask for a work or school excuse. ?You will get an email in the next two days asking about your experience. I hope that your e-visit has been valuable and will speed your recovery. ?I have spent 5 minutes in review of e-visit questionnaire, review and updating patient chart, medical decision making and response to patient.  ? ?Emalina Dubreuil S Mayers, PA-C ? ? ? ? ?

## 2021-10-22 ENCOUNTER — Telehealth: Payer: Self-pay | Admitting: Family

## 2021-10-22 NOTE — Telephone Encounter (Signed)
Pt called to let PCP know that she has got to stop taking Cymbalta because its not doing her any good. Wants to know if she can go back to taking a higher dose of what she was previously on (Zoloft).  ? ?Pt has an appt to see PCP on 5/16 but if she needs to be seen for this, can we work her in to come in sooner? ?

## 2021-10-23 MED ORDER — SERTRALINE HCL 100 MG PO TABS: 100.0000 mg | ORAL_TABLET | Freq: Every day | ORAL | 1 refills | Status: DC

## 2021-10-23 NOTE — Telephone Encounter (Signed)
Patient states that she found her bottle and noticed that she has noticed she had not taken Cymbalta in about 10 days. She wants to continue the Cymbalta. Wants to know how she could restart the Cymbalta  ?

## 2021-10-23 NOTE — Telephone Encounter (Signed)
Stop Cymbalta and restart Zoloft. Prescription sent to pharmacy. Keep follow up appointment.  ?

## 2021-10-24 MED ORDER — DULOXETINE HCL 60 MG PO CPEP: 60.0000 mg | ORAL_CAPSULE | Freq: Every day | ORAL | 1 refills | Status: DC

## 2021-10-24 MED ORDER — DULOXETINE HCL 30 MG PO CPEP: ORAL_CAPSULE | ORAL | 0 refills | Status: DC

## 2021-10-24 NOTE — Telephone Encounter (Signed)
Ok, I had already sent in her zoloft. Do not pick this up. Cymbalta 30 mg then in 2 weeks increase to 60 mg .  ?

## 2021-10-24 NOTE — Addendum Note (Signed)
Addended by: Evelina Dun A on: 10/24/2021 01:44 PM ? ? Modules accepted: Orders ? ?

## 2021-10-24 NOTE — Telephone Encounter (Signed)
Attempted to contact patient- mailbox full  

## 2021-10-24 NOTE — Telephone Encounter (Signed)
Patient aware and verbalizes understanding. 

## 2021-11-05 ENCOUNTER — Encounter: Payer: Self-pay | Admitting: Family

## 2021-11-05 ENCOUNTER — Ambulatory Visit: Payer: BC Managed Care – PPO | Admitting: Family

## 2021-11-05 VITALS — BP 90/55 | HR 86 | Temp 98.0°F | Ht 64.0 in | Wt 154.0 lb

## 2021-11-05 DIAGNOSIS — F411 Generalized anxiety disorder: Secondary | ICD-10-CM | POA: Diagnosis not present

## 2021-11-05 DIAGNOSIS — M797 Fibromyalgia: Secondary | ICD-10-CM

## 2021-11-05 DIAGNOSIS — G4700 Insomnia, unspecified: Secondary | ICD-10-CM

## 2021-11-05 DIAGNOSIS — I10 Essential (primary) hypertension: Secondary | ICD-10-CM | POA: Diagnosis not present

## 2021-11-05 DIAGNOSIS — M159 Polyosteoarthritis, unspecified: Secondary | ICD-10-CM

## 2021-11-05 MED ORDER — TRAMADOL HCL 50 MG PO TABS: 50.0000 mg | ORAL_TABLET | Freq: Three times a day (TID) | ORAL | 2 refills | Status: DC | PRN

## 2021-11-05 MED ORDER — LISINOPRIL 5 MG PO TABS: 5.0000 mg | ORAL_TABLET | Freq: Every day | ORAL | 3 refills | Status: DC

## 2021-11-05 NOTE — Patient Instructions (Signed)
Osteoarthritis  Osteoarthritis is a type of arthritis. It refers to joint pain or joint disease. Osteoarthritis affects tissue that covers the ends of bones in joints (cartilage). Cartilage acts as a cushion between the bones and helps them move smoothly. Osteoarthritis occurs when cartilage in the joints gets worn down. Osteoarthritis is sometimes called "wear and tear" arthritis. Osteoarthritis is the most common form of arthritis. It often occurs in older people. It is a condition that gets worse over time. The joints most often affected by this condition are in the fingers, toes, hips, knees, and spine, including the neck and lower back. What are the causes? This condition is caused by the wearing down of cartilage that covers the ends of bones. What increases the risk? The following factors may make you more likely to develop this condition: Being age 50 or older. Obesity. Overuse of joints. Past injury of a joint. Past surgery on a joint. Family history of osteoarthritis. What are the signs or symptoms? The main symptoms of this condition are pain, swelling, and stiffness in the joint. Other symptoms may include: An enlarged joint. More pain and further damage caused by small pieces of bone or cartilage that break off and float inside of the joint. Small deposits of bone (osteophytes) that grow on the edges of the joint. A grating or scraping feeling inside the joint when you move it. Popping or creaking sounds when you move. Difficulty walking or exercising. An inability to grip items, twist your hand(s), or control the movements of your hands and fingers. How is this diagnosed? This condition may be diagnosed based on: Your medical history. A physical exam. Your symptoms. X-rays of the affected joint(s). Blood tests to rule out other types of arthritis. How is this treated? There is no cure for this condition, but treatment can help control pain and improve joint function.  Treatment may include a combination of therapies, such as: Pain relief techniques, such as: Applying heat and cold to the joint. Massage. A form of talk therapy called cognitive behavioral therapy (CBT). This therapy helps you set goals and follow up on the changes that you make. Medicines for pain and inflammation. The medicines can be taken by mouth or applied to the skin. They include: NSAIDs, such as ibuprofen. Prescription medicines. Strong anti-inflammatory medicines (corticosteroids). Certain nutritional supplements. A prescribed exercise program. You may work with a physical therapist. Assistive devices, such as a brace, wrap, splint, specialized glove, or cane. A weight control plan. Surgery, such as: An osteotomy. This is done to reposition the bones and relieve pain or to remove loose pieces of bone and cartilage. Joint replacement surgery. You may need this surgery if you have advanced osteoarthritis. Follow these instructions at home: Activity Rest your affected joints as told by your health care provider. Exercise as told by your health care provider. He or she may recommend specific types of exercise, such as: Strengthening exercises. These are done to strengthen the muscles that support joints affected by arthritis. Aerobic activities. These are exercises, such as brisk walking or water aerobics, that increase your heart rate. Range-of-motion activities. These help your joints move more easily. Balance and agility exercises. Managing pain, stiffness, and swelling     If directed, apply heat to the affected area as often as told by your health care provider. Use the heat source that your health care provider recommends, such as a moist heat pack or a heating pad. If you have a removable assistive device, remove it   as told by your health care provider. Place a towel between your skin and the heat source. If your health care provider tells you to keep the assistive device  on while you apply heat, place a towel between the assistive device and the heat source. Leave the heat on for 20-30 minutes. Remove the heat if your skin turns bright red. This is especially important if you are unable to feel pain, heat, or cold. You may have a greater risk of getting burned. If directed, put ice on the affected area. To do this: If you have a removable assistive device, remove it as told by your health care provider. Put ice in a plastic bag. Place a towel between your skin and the bag. If your health care provider tells you to keep the assistive device on during icing, place a towel between the assistive device and the bag. Leave the ice on for 20 minutes, 2-3 times a day. Move your fingers or toes often to reduce stiffness and swelling. Raise (elevate) the injured area above the level of your heart while you are sitting or lying down. General instructions Take over-the-counter and prescription medicines only as told by your health care provider. Maintain a healthy weight. Follow instructions from your health care provider for weight control. Do not use any products that contain nicotine or tobacco, such as cigarettes, e-cigarettes, and chewing tobacco. If you need help quitting, ask your health care provider. Use assistive devices as told by your health care provider. Keep all follow-up visits as told by your health care provider. This is important. Where to find more information National Institute of Arthritis and Musculoskeletal and Skin Diseases: www.niams.nih.gov National Institute on Aging: www.nia.nih.gov American College of Rheumatology: www.rheumatology.org Contact a health care provider if: You have redness, swelling, or a feeling of warmth in a joint that gets worse. You have a fever along with joint or muscle aches. You develop a rash. You have trouble doing your normal activities. Get help right away if: You have pain that gets worse and is not relieved by  pain medicine. Summary Osteoarthritis is a type of arthritis that affects tissue covering the ends of bones in joints (cartilage). This condition is caused by the wearing down of cartilage that covers the ends of bones. The main symptom of this condition is pain, swelling, and stiffness in the joint. There is no cure for this condition, but treatment can help control pain and improve joint function. This information is not intended to replace advice given to you by your health care provider. Make sure you discuss any questions you have with your health care provider. Document Revised: 06/06/2019 Document Reviewed: 06/06/2019 Elsevier Patient Education  2023 Elsevier Inc.  

## 2021-11-05 NOTE — Progress Notes (Signed)
? ?Subjective:  ? ? Patient ID: Peggy Hall, female    DOB: [birth year], 61 y.o.   MRN: [id number] ? ?Chief Complaint  ?Patient presents with  ? Hypertension  ? ?PT presents to the office today to recheck HTN. She was seen on 10/08/21 and started on Lisinopril 20 mg. Her BP is low today.  ? ?Pt states her pain is increasing with fibromyalgia. States her pain is a 7 out 10 in knees, shoulders, and neck. She restarted her Cymbalta 30 mg.  ?Hypertension ?This is a chronic problem. The current episode started more than 1 year ago. The problem has been resolved since onset. The problem is controlled. Associated symptoms include malaise/fatigue. Pertinent negatives include no peripheral edema or shortness of breath. Past treatments include ACE inhibitors.  ?Insomnia ?Primary symptoms: difficulty falling asleep, frequent awakening, malaise/fatigue.   ?The current episode started more than one year. The onset quality is gradual. The problem occurs intermittently.  ?Arthritis ?Presents for follow-up visit. She complains of pain and stiffness. The symptoms have been worsening. Affected locations include the left shoulder, left knee, right knee, right shoulder, left MCP and right MCP. Her pain is at a severity of 7/10.  ? ? ? ?Review of Systems  ?Constitutional:  Positive for malaise/fatigue.  ?Respiratory:  Negative for shortness of breath.   ?Musculoskeletal:  Positive for arthritis and stiffness.  ?Psychiatric/Behavioral:  The patient has insomnia.   ?All other systems reviewed and are negative. ? ?   ?Objective:  ? Physical Exam ?Vitals reviewed.  ?Constitutional:   ?   General: She is not in acute distress. ?   Appearance: She is well-developed.  ?HENT:  ?   Head: Normocephalic and atraumatic.  ?Eyes:  ?   Pupils: Pupils are equal, round, and reactive to light.  ?Neck:  ?   Thyroid: No thyromegaly.  ?Cardiovascular:  ?   Rate and Rhythm: Normal rate and regular rhythm.  ?   Heart sounds: Normal heart sounds. No murmur  heard. ?Pulmonary:  ?   Effort: Pulmonary effort is normal. No respiratory distress.  ?   Breath sounds: Normal breath sounds. No wheezing.  ?Abdominal:  ?   General: Bowel sounds are normal. There is no distension.  ?   Palpations: Abdomen is soft.  ?   Tenderness: There is no abdominal tenderness.  ?Musculoskeletal:     ?   General: No tenderness. Normal range of motion.  ?   Cervical back: Normal range of motion and neck supple.  ?Skin: ?   General: Skin is warm and dry.  ?Neurological:  ?   Mental Status: She is alert and oriented to person, place, and time.  ?   Cranial Nerves: No cranial nerve deficit.  ?   Deep Tendon Reflexes: Reflexes are normal and symmetric.  ?Psychiatric:     ?   Mood and Affect: Affect is tearful.     ?   Behavior: Behavior normal.     ?   Thought Content: Thought content normal.     ?   Judgment: Judgment normal.  ? ? ? ?BP (!) 90/55   Pulse 86   Temp 98 ?F (36.7 ?C) (Temporal)   Ht 5' 4"  (1.626 m)   Wt 154 lb (69.9 kg)   SpO2 98%   BMI 26.43 kg/m?  ? ? ?   ?Assessment & Plan:  ?Peggy Hall comes in today with chief complaint of Hypertension ? ? ?Diagnosis and orders addressed: ? ?1. Primary hypertension ?Will  decrease lisinopril to 5 mg from 20 mg  ?Force fluids ?- BMP8+EGFR ? ?2. Essential hypertension ? ?- lisinopril (ZESTRIL) 5 MG tablet; Take 1 tablet (5 mg total) by mouth daily.  Dispense: 90 tablet; Refill: 3 ?- BMP8+EGFR ? ?3. Insomnia, unspecified type ?Continue medications  ?- BMP8+EGFR ? ?4. GAD (generalized anxiety disorder) ?- BMP8+EGFR ? ?5. Fibromyalgia ?- traMADol (ULTRAM) 50 MG tablet; Take 1 tablet (50 mg total) by mouth every 8 (eight) hours as needed.  Dispense: 60 tablet; Refill: 2 ?- BMP8+EGFR ? ?6. Osteoarthritis of multiple joints, unspecified osteoarthritis type ?Ultram as needed  ?Pt tearful today. Plans to file for disability.  ?- traMADol (ULTRAM) 50 MG tablet; Take 1 tablet (50 mg total) by mouth every 8 (eight) hours as needed.  Dispense: 60 tablet;  Refill: 2 ?- BMP8+EGFR ? ? ?Labs pending ?Health Maintenance reviewed ?Diet and exercise encouraged ? ?Follow up plan: ?2 weeks to recheck HTN, osteoarthritis  ? ? ?Evelina Dun, FNP ? ? ? ? ?

## 2021-11-06 LAB — BMP8+EGFR
BUN/Creatinine Ratio: 23 (ref 9–23)
BUN: 17 mg/dL (ref 6–24)
CO2: 21 mmol/L (ref 20–29)
Calcium: 10 mg/dL (ref 8.7–10.2)
Chloride: 102 mmol/L (ref 96–106)
Creatinine, Ser: 0.74 mg/dL (ref 0.57–1.00)
Glucose: 248 mg/dL — ABNORMAL HIGH (ref 70–99)
Potassium: 5.3 mmol/L — ABNORMAL HIGH (ref 3.5–5.2)
Sodium: 142 mmol/L (ref 134–144)
eGFR: 93 mL/min/{1.73_m2} (ref >59)

## 2021-11-14 DIAGNOSIS — M25511 Pain in right shoulder: Secondary | ICD-10-CM | POA: Diagnosis not present

## 2021-11-14 DIAGNOSIS — M542 Cervicalgia: Secondary | ICD-10-CM | POA: Diagnosis not present

## 2021-11-14 DIAGNOSIS — S161XXA Strain of muscle, fascia and tendon at neck level, initial encounter: Secondary | ICD-10-CM | POA: Diagnosis not present

## 2021-11-14 DIAGNOSIS — M25512 Pain in left shoulder: Secondary | ICD-10-CM | POA: Diagnosis not present

## 2021-11-29 ENCOUNTER — Ambulatory Visit: Payer: BC Managed Care – PPO | Admitting: Family

## 2021-12-02 DIAGNOSIS — Z791 Long term (current) use of non-steroidal anti-inflammatories (NSAID): Secondary | ICD-10-CM | POA: Diagnosis not present

## 2021-12-02 DIAGNOSIS — M150 Primary generalized (osteo)arthritis: Secondary | ICD-10-CM | POA: Diagnosis not present

## 2021-12-02 DIAGNOSIS — M797 Fibromyalgia: Secondary | ICD-10-CM | POA: Diagnosis not present

## 2021-12-12 ENCOUNTER — Ambulatory Visit: Payer: BC Managed Care – PPO | Admitting: Family Medicine

## 2021-12-12 ENCOUNTER — Encounter: Payer: Self-pay | Admitting: Family Medicine

## 2021-12-12 VITALS — BP 125/79 | HR 87 | Temp 98.4°F | Ht 64.0 in | Wt 155.0 lb

## 2021-12-12 DIAGNOSIS — F32A Depression, unspecified: Secondary | ICD-10-CM

## 2021-12-12 DIAGNOSIS — M797 Fibromyalgia: Secondary | ICD-10-CM | POA: Diagnosis not present

## 2021-12-12 NOTE — Progress Notes (Signed)
BP 125/79   Pulse 87   Temp 98.4 F (36.9 C)   Ht '5\' 4"'$  (1.626 m)   Wt 155 lb (70.3 kg)   SpO2 97%   BMI 26.61 kg/m    Subjective:   Patient ID: Peggy Hall, female    DOB: [birth year], 61 y.o.   MRN: [id number]  HPI: Peggy Hall is a 61 y.o. female presenting on 12/12/2021 for Discuss short term disability (C/O fibromyalgia. )   HPI Fibromyalgia pain and disability Patient is coming in for fibromyalgia pain and disability that she says is been worse over the past few days and she is missed work and she had an FMLA but now she is looking to possibly get towards disability and she is trying for short-term disability as well.  She is here today because she feels like she needs a note for work.  She is taking her medicines for it including tramadol and Cymbalta  Relevant past medical, surgical, family and social history reviewed and updated as indicated. Interim medical history since our last visit reviewed. Allergies and medications reviewed and updated.  Review of Systems  Constitutional:  Negative for chills and fever.  Eyes:  Negative for visual disturbance.  Respiratory:  Negative for chest tightness and shortness of breath.   Cardiovascular:  Negative for chest pain and leg swelling.  Genitourinary:  Negative for difficulty urinating and dysuria.  Musculoskeletal:  Positive for arthralgias and myalgias. Negative for back pain and gait problem.  Skin:  Negative for rash.  Neurological:  Negative for light-headedness and headaches.  Psychiatric/Behavioral:  Negative for agitation and behavioral problems.   All other systems reviewed and are negative.   Per HPI unless specifically indicated above   Allergies as of 12/12/2021   No Known Allergies      Medication List        Accurate as of December 12, 2021 11:46 AM. If you have any questions, ask your nurse or doctor.          atorvastatin 10 MG tablet Commonly known as: LIPITOR Take 1 tablet by mouth once daily    DULoxetine 30 MG capsule Commonly known as: Cymbalta Take 1 capsule (30 mg total) by mouth daily for 14 days, THEN 2 capsules (60 mg total) daily for 14 days. Start taking on: Oct 24, 2021   DULoxetine 60 MG capsule Commonly known as: Cymbalta Take 1 capsule (60 mg total) by mouth daily.   lisinopril 5 MG tablet Commonly known as: ZESTRIL Take 1 tablet (5 mg total) by mouth daily.   propranolol 20 MG tablet Commonly known as: INDERAL Take 1 tablet by mouth twice daily   QUEtiapine 50 MG tablet Commonly known as: SEROQUEL Take 1 tablet (50 mg total) by mouth at bedtime.   sulindac 200 MG tablet Commonly known as: CLINORIL Take 200 mg by mouth 2 (two) times daily.   traMADol 50 MG tablet Commonly known as: ULTRAM Take 1 tablet (50 mg total) by mouth every 8 (eight) hours as needed.   traZODone 100 MG tablet Commonly known as: DESYREL Take 1-1.5 tablets (100-150 mg total) by mouth at bedtime.   Trulicity 6.28 ZM/6.2HU Sopn Generic drug: Dulaglutide Inject 0.75 mg into the skin once a week.   Xigduo XR 10-998 MG Tb24 Generic drug: Dapagliflozin-metFORMIN HCl ER Take 2 tablets by mouth every morning.         Objective:   BP 125/79   Pulse 87   Temp 98.4 F (36.9 C)  Ht '5\' 4"'$  (1.626 m)   Wt 155 lb (70.3 kg)   SpO2 97%   BMI 26.61 kg/m   Wt Readings from Last 3 Encounters:  12/12/21 155 lb (70.3 kg)  11/05/21 154 lb (69.9 kg)  10/08/21 158 lb (71.7 kg)    Physical Exam Vitals and nursing note reviewed.  Constitutional:      General: She is not in acute distress.    Appearance: She is well-developed. She is not diaphoretic.  Eyes:     Conjunctiva/sclera: Conjunctivae normal.  Cardiovascular:     Rate and Rhythm: Normal rate and regular rhythm.     Heart sounds: Normal heart sounds. No murmur heard. Pulmonary:     Effort: Pulmonary effort is normal. No respiratory distress.     Breath sounds: Normal breath sounds. No wheezing.  Musculoskeletal:         General: Tenderness (Muscle aches and pains all over and point tenderness) present. Normal range of motion.  Skin:    General: Skin is warm and dry.     Findings: No rash.  Neurological:     Mental Status: She is alert and oriented to person, place, and time.     Coordination: Coordination normal.  Psychiatric:        Behavior: Behavior normal.       Assessment & Plan:   Problem List Items Addressed This Visit       Other   Depression - Primary   Fibromyalgia    Patient has fibromyalgia and has been out on FMLA and is looking for a letter to continue being out, I saw that she is seeing her PCP in 1 week so I will write a note to get to that visit and then they can decide from there how they can go forward and help her.  Knowing that she knows her history better. Follow up plan: Return if symptoms worsen or fail to improve.  Counseling provided for all of the vaccine components No orders of the defined types were placed in this encounter.   Caryl Pina, MD [facility] 12/12/2021, 11:46 AM

## 2021-12-12 NOTE — Patient Instructions (Signed)
Myofascial Pain Syndrome and Fibromyalgia ?Myofascial pain syndrome and fibromyalgia are both pain disorders. You may feel this pain mainly in your muscles. ?Myofascial pain syndrome: ?Always has tender points in the muscles that will cause pain when pressed (trigger points). The pain may come and go. ?Usually affects your neck, upper back, and shoulder areas. The pain often moves into your arms and hands. ?Fibromyalgia: ?Has muscle pains and tenderness that come and go. ?Is often associated with tiredness (fatigue) and sleep problems. ?Has trigger points. ?Tends to be long-lasting (chronic), but is not life-threatening. ?Fibromyalgia and myofascial pain syndrome are not the same. However, they often occur together. If you have both conditions, each can make the other worse. Both are common and can cause enough pain and fatigue to make day-to-day activities difficult. Both can be hard to diagnose because their symptoms are common in many other conditions. ?What are the causes? ?The exact causes of these conditions are not known. ?What increases the risk? ?You are more likely to develop either of these conditions if: ?You have a family history of the condition. ?You are female. ?You have certain triggers, such as: ?Spine disorders. ?An injury (trauma) or other physical stressors. ?Being under a lot of stress. ?Medical conditions such as osteoarthritis, rheumatoid arthritis, or lupus. ?What are the signs or symptoms? ?Fibromyalgia ?The main symptom of fibromyalgia is widespread pain and tenderness in your muscles. Pain is sometimes described as stabbing, shooting, or burning. ?You may also have: ?Tingling or numbness. ?Sleep problems and fatigue. ?Problems with attention and concentration (fibro fog). ?Other symptoms may include: ?Bowel and bladder problems. ?Headaches. ?Vision problems. ?Sensitivity to odors and noises. ?Depression or mood changes. ?Painful menstrual periods (dysmenorrhea). ?Dry skin or eyes. ?These  symptoms can vary over time. ?Myofascial pain syndrome ?Symptoms of myofascial pain syndrome include: ?Tight, ropy bands of muscle. ?Uncomfortable sensations in muscle areas. These may include aching, cramping, burning, numbness, tingling, and weakness. ?Difficulty moving certain parts of the body freely (poor range of motion). ?How is this diagnosed? ?This condition may be diagnosed by your symptoms and medical history. You will also have a physical exam. In general: ?Fibromyalgia is diagnosed if you have pain, fatigue, and other symptoms for more than 3 months, and symptoms cannot be explained by another condition. ?Myofascial pain syndrome is diagnosed if you have trigger points in your muscles, and those trigger points are tender and cause pain elsewhere in your body (referred pain). ?How is this treated? ?Treatment for these conditions depends on the type that you have. ?For fibromyalgia a healthy lifestyle is the most important treatment including aerobic and strength exercises. Different types of medicines are used to help treat pain and include: ?NSAIDs. ?Medicines for treating depression. ?Medicines that help control seizures. ?Medicines that relax the muscles. ?Treatment for myofascial pain syndrome includes: ?Pain medicines, such as NSAIDs. ?Cooling and stretching of muscles. ?Massage therapy with myofascial release technique. ?Trigger point injections. ?Treating these conditions often requires a team of health care providers. These may include: ?Your primary care provider. ?A physical therapist. ?Complementary health care providers, such as massage therapists or acupuncturists. ?A psychiatrist for cognitive behavioral therapy. ?Follow these instructions at home: ?Medicines ?Take over-the-counter and prescription medicines only as told by your health care provider. ?Ask your health care provider if the medicine prescribed to you: ?Requires you to avoid driving or using machinery. ?Can cause constipation.  You may need to take these actions to prevent or treat constipation: ?Drink enough fluid to keep your urine pale   yellow. ?Take over-the-counter or prescription medicines. ?Eat foods that are high in fiber, such as beans, whole grains, and fresh fruits and vegetables. ?Limit foods that are high in fat and processed sugars, such as fried or sweet foods. ?Lifestyle ? ?Do exercises as told by your health care provider or physical therapist. ?Practice relaxation techniques to control your stress. You may want to try: ?Biofeedback. ?Visual imagery. ?Hypnosis. ?Muscle relaxation. ?Yoga. ?Meditation. ?Maintain a healthy lifestyle. This includes eating a healthy diet and getting enough sleep. ?Do not use any products that contain nicotine or tobacco. These products include cigarettes, chewing tobacco, and vaping devices, such as e-cigarettes. If you need help quitting, ask your health care provider. ?General instructions ?Talk to your health care provider about complementary treatments, such as acupuncture or massage. ?Do not do activities that stress or strain your muscles. This includes repetitive motions and heavy lifting. ?Keep all follow-up visits. This is important. ?Where to find support ?Consider joining a support group with others who are diagnosed with this condition. ?National Fibromyalgia Association: www.fmaware.org ?Where to find more information ?American Chronic Pain Association: www.theacpa.org ?Contact a health care provider if: ?You have new symptoms. ?Your symptoms get worse or your pain is severe. ?You have side effects from your medicines. ?You have trouble sleeping. ?Your condition is causing depression or anxiety. ?Get help right away if: ?You have thoughts of hurting yourself or others. ?Get help right awayif you feel like you may hurt yourself or others, or have thoughts about taking your own life. Go to your nearest emergency room or: ?Call 911. ?Call the National Suicide Prevention Lifeline at  1-800-273-8255 or 988. This is open 24 hours a day. ?Text the Crisis Text Line at 741741. ?Summary ?Myofascial pain syndrome and fibromyalgia are pain disorders. ?Myofascial pain syndrome has tender points in the muscles that will cause pain when pressed (trigger points). Fibromyalgia also has muscle pains and tenderness that come and go, but this condition is often associated with fatigue and sleep disturbances. ?Fibromyalgia and myofascial pain syndrome are not the same but often occur together, causing pain and fatigue that make day-to-day activities difficult. ?Follow your health care provider's instructions for taking medicines and maintaining a healthy lifestyle. ?This information is not intended to replace advice given to you by your health care provider. Make sure you discuss any questions you have with your health care provider. ?Document Revised: 05/10/2021 Document Reviewed: 05/10/2021 ?Elsevier Patient Education ? 2023 Elsevier Inc. ? ?

## 2021-12-13 ENCOUNTER — Other Ambulatory Visit: Payer: Self-pay | Admitting: Family

## 2021-12-13 DIAGNOSIS — E1169 Type 2 diabetes mellitus with other specified complication: Secondary | ICD-10-CM

## 2021-12-19 ENCOUNTER — Encounter: Payer: Self-pay | Admitting: Family

## 2021-12-19 ENCOUNTER — Ambulatory Visit: Payer: BC Managed Care – PPO | Admitting: Family

## 2021-12-19 VITALS — BP 107/67 | HR 78 | Temp 96.9°F | Ht 64.0 in | Wt 153.2 lb

## 2021-12-19 DIAGNOSIS — K760 Fatty (change of) liver, not elsewhere classified: Secondary | ICD-10-CM | POA: Diagnosis not present

## 2021-12-19 DIAGNOSIS — G4700 Insomnia, unspecified: Secondary | ICD-10-CM

## 2021-12-19 DIAGNOSIS — E1169 Type 2 diabetes mellitus with other specified complication: Secondary | ICD-10-CM

## 2021-12-19 DIAGNOSIS — M159 Polyosteoarthritis, unspecified: Secondary | ICD-10-CM

## 2021-12-19 DIAGNOSIS — E785 Hyperlipidemia, unspecified: Secondary | ICD-10-CM

## 2021-12-19 DIAGNOSIS — I10 Essential (primary) hypertension: Secondary | ICD-10-CM

## 2021-12-19 DIAGNOSIS — Z79899 Other long term (current) drug therapy: Secondary | ICD-10-CM | POA: Diagnosis not present

## 2021-12-19 DIAGNOSIS — F32A Depression, unspecified: Secondary | ICD-10-CM

## 2021-12-19 DIAGNOSIS — F411 Generalized anxiety disorder: Secondary | ICD-10-CM

## 2021-12-19 DIAGNOSIS — Z79891 Long term (current) use of opiate analgesic: Secondary | ICD-10-CM | POA: Diagnosis not present

## 2021-12-19 DIAGNOSIS — M797 Fibromyalgia: Secondary | ICD-10-CM

## 2021-12-19 LAB — BAYER DCA HB A1C WAIVED: HB A1C (BAYER DCA - WAIVED): 7.5 % — ABNORMAL HIGH (ref 4.8–5.6)

## 2021-12-19 MED ORDER — TRAMADOL HCL 50 MG PO TABS: 50.0000 mg | ORAL_TABLET | Freq: Three times a day (TID) | ORAL | 2 refills | Status: DC | PRN

## 2021-12-19 NOTE — Patient Instructions (Signed)
Osteoarthritis  Osteoarthritis is a type of arthritis. It refers to joint pain or joint disease. Osteoarthritis affects tissue that covers the ends of bones in joints (cartilage). Cartilage acts as a cushion between the bones and helps them move smoothly. Osteoarthritis occurs when cartilage in the joints gets worn down. Osteoarthritis is sometimes called "wear and tear" arthritis. Osteoarthritis is the most common form of arthritis. It often occurs in older people. It is a condition that gets worse over time. The joints most often affected by this condition are in the fingers, toes, hips, knees, and spine, including the neck and lower back. What are the causes? This condition is caused by the wearing down of cartilage that covers the ends of bones. What increases the risk? The following factors may make you more likely to develop this condition: Being age 50 or older. Obesity. Overuse of joints. Past injury of a joint. Past surgery on a joint. Family history of osteoarthritis. What are the signs or symptoms? The main symptoms of this condition are pain, swelling, and stiffness in the joint. Other symptoms may include: An enlarged joint. More pain and further damage caused by small pieces of bone or cartilage that break off and float inside of the joint. Small deposits of bone (osteophytes) that grow on the edges of the joint. A grating or scraping feeling inside the joint when you move it. Popping or creaking sounds when you move. Difficulty walking or exercising. An inability to grip items, twist your hand(s), or control the movements of your hands and fingers. How is this diagnosed? This condition may be diagnosed based on: Your medical history. A physical exam. Your symptoms. X-rays of the affected joint(s). Blood tests to rule out other types of arthritis. How is this treated? There is no cure for this condition, but treatment can help control pain and improve joint function.  Treatment may include a combination of therapies, such as: Pain relief techniques, such as: Applying heat and cold to the joint. Massage. A form of talk therapy called cognitive behavioral therapy (CBT). This therapy helps you set goals and follow up on the changes that you make. Medicines for pain and inflammation. The medicines can be taken by mouth or applied to the skin. They include: NSAIDs, such as ibuprofen. Prescription medicines. Strong anti-inflammatory medicines (corticosteroids). Certain nutritional supplements. A prescribed exercise program. You may work with a physical therapist. Assistive devices, such as a brace, wrap, splint, specialized glove, or cane. A weight control plan. Surgery, such as: An osteotomy. This is done to reposition the bones and relieve pain or to remove loose pieces of bone and cartilage. Joint replacement surgery. You may need this surgery if you have advanced osteoarthritis. Follow these instructions at home: Activity Rest your affected joints as told by your health care provider. Exercise as told by your health care provider. He or she may recommend specific types of exercise, such as: Strengthening exercises. These are done to strengthen the muscles that support joints affected by arthritis. Aerobic activities. These are exercises, such as brisk walking or water aerobics, that increase your heart rate. Range-of-motion activities. These help your joints move more easily. Balance and agility exercises. Managing pain, stiffness, and swelling     If directed, apply heat to the affected area as often as told by your health care provider. Use the heat source that your health care provider recommends, such as a moist heat pack or a heating pad. If you have a removable assistive device, remove it   as told by your health care provider. Place a towel between your skin and the heat source. If your health care provider tells you to keep the assistive device  on while you apply heat, place a towel between the assistive device and the heat source. Leave the heat on for 20-30 minutes. Remove the heat if your skin turns bright red. This is especially important if you are unable to feel pain, heat, or cold. You may have a greater risk of getting burned. If directed, put ice on the affected area. To do this: If you have a removable assistive device, remove it as told by your health care provider. Put ice in a plastic bag. Place a towel between your skin and the bag. If your health care provider tells you to keep the assistive device on during icing, place a towel between the assistive device and the bag. Leave the ice on for 20 minutes, 2-3 times a day. Move your fingers or toes often to reduce stiffness and swelling. Raise (elevate) the injured area above the level of your heart while you are sitting or lying down. General instructions Take over-the-counter and prescription medicines only as told by your health care provider. Maintain a healthy weight. Follow instructions from your health care provider for weight control. Do not use any products that contain nicotine or tobacco, such as cigarettes, e-cigarettes, and chewing tobacco. If you need help quitting, ask your health care provider. Use assistive devices as told by your health care provider. Keep all follow-up visits as told by your health care provider. This is important. Where to find more information National Institute of Arthritis and Musculoskeletal and Skin Diseases: www.niams.nih.gov National Institute on Aging: www.nia.nih.gov American College of Rheumatology: www.rheumatology.org Contact a health care provider if: You have redness, swelling, or a feeling of warmth in a joint that gets worse. You have a fever along with joint or muscle aches. You develop a rash. You have trouble doing your normal activities. Get help right away if: You have pain that gets worse and is not relieved by  pain medicine. Summary Osteoarthritis is a type of arthritis that affects tissue covering the ends of bones in joints (cartilage). This condition is caused by the wearing down of cartilage that covers the ends of bones. The main symptom of this condition is pain, swelling, and stiffness in the joint. There is no cure for this condition, but treatment can help control pain and improve joint function. This information is not intended to replace advice given to you by your health care provider. Make sure you discuss any questions you have with your health care provider. Document Revised: 06/06/2019 Document Reviewed: 06/06/2019 Elsevier Patient Education  2023 Elsevier Inc.  

## 2021-12-19 NOTE — Progress Notes (Signed)
Subjective:    Patient ID: Peggy Hall, female    DOB: 08/20/1960, 61 y.o.   MRN: [id number]  Chief Complaint  Patient presents with   Medical Management of Chronic Issues   Pt presents to the office today for chronic follow up. She is followed by Rheumatologists every 3 months for Fibromyalgia. She was started on Sulindac 200 mg BID. She started Cymbalta 60 mg .She continues to have bilateral shoulders, knee, and back pain that has improved.    She has hepatic steatosis and followed by GI.  Diabetes She presents for her follow-up diabetic visit. She has type 2 diabetes mellitus. Hypoglycemia symptoms include nervousness/anxiousness. Pertinent negatives for diabetes include no blurred vision and no foot paresthesias. Symptoms are stable. Diabetic complications include heart disease. Pertinent negatives for diabetic complications include no CVA or peripheral neuropathy. Risk factors for coronary artery disease include dyslipidemia, diabetes mellitus, hypertension and sedentary lifestyle. She is following a diabetic and generally healthy diet. Her overall blood glucose range is 70-90 mg/dl. Eye exam is not current.  Hypertension This is a chronic problem. The current episode started more than 1 year ago. The problem has been resolved since onset. The problem is controlled. Associated symptoms include anxiety and malaise/fatigue. Pertinent negatives include no blurred vision, peripheral edema or shortness of breath. Risk factors for coronary artery disease include dyslipidemia, obesity and sedentary lifestyle. The current treatment provides moderate improvement. There is no history of CVA.  Hyperlipidemia This is a chronic problem. The current episode started more than 1 year ago. The problem is controlled. Pertinent negatives include no shortness of breath. Current antihyperlipidemic treatment includes statins. The current treatment provides moderate improvement of lipids. Risk factors for coronary  artery disease include dyslipidemia, diabetes mellitus, hypertension, a sedentary lifestyle and post-menopausal.  Insomnia Primary symptoms: difficulty falling asleep, frequent awakening, malaise/fatigue.   The current episode started more than one year. The problem occurs intermittently. The treatment provided moderate relief. PMH includes: depression.   Anxiety Presents for follow-up visit. Symptoms include depressed mood, excessive worry, insomnia, irritability, nervous/anxious behavior and restlessness. Patient reports no shortness of breath. Symptoms occur occasionally. The severity of symptoms is moderate.    Depression        This is a chronic problem.  The current episode started more than 1 year ago.   The onset quality is gradual.   The problem occurs intermittently.  Associated symptoms include insomnia, restlessness and sad.  Associated symptoms include no helplessness, no hopelessness and not irritable.  Past treatments include SNRIs - Serotonin and norepinephrine reuptake inhibitors.  Past medical history includes anxiety.   Arthritis Presents for follow-up visit. She complains of pain and stiffness. Affected locations include the left knee, right knee, left hip, right hip, right wrist, left wrist, right MCP and left MCP. Her pain is at a severity of 8/10.      Review of Systems  Constitutional:  Positive for irritability and malaise/fatigue.  Eyes:  Negative for blurred vision.  Respiratory:  Negative for shortness of breath.   Musculoskeletal:  Positive for arthritis and stiffness.  Psychiatric/Behavioral:  Positive for depression. The patient is nervous/anxious and has insomnia.   All other systems reviewed and are negative.      Objective:   Physical Exam Vitals reviewed.  Constitutional:      General: She is not irritable.She is not in acute distress.    Appearance: She is well-developed.  HENT:     Head: Normocephalic and atraumatic.  Right Ear: Tympanic  membrane normal.     Left Ear: Tympanic membrane normal.  Eyes:     Pupils: Pupils are equal, round, and reactive to light.  Neck:     Thyroid: No thyromegaly.  Cardiovascular:     Rate and Rhythm: Normal rate and regular rhythm.     Heart sounds: Normal heart sounds. No murmur heard. Pulmonary:     Effort: Pulmonary effort is normal. No respiratory distress.     Breath sounds: Normal breath sounds. No wheezing.  Abdominal:     General: Bowel sounds are normal. There is no distension.     Palpations: Abdomen is soft.     Tenderness: There is no abdominal tenderness.  Musculoskeletal:        General: Tenderness present. Normal range of motion.     Cervical back: Normal range of motion and neck supple.     Comments: Pain in lumbar and bilateral hands with flexion  Skin:    General: Skin is warm and dry.  Neurological:     Mental Status: She is alert and oriented to person, place, and time.     Cranial Nerves: No cranial nerve deficit.     Deep Tendon Reflexes: Reflexes are normal and symmetric.  Psychiatric:        Behavior: Behavior normal.        Thought Content: Thought content normal.        Judgment: Judgment normal.       BP 107/67   Pulse 78   Temp (!) 96.9 F (36.1 C)   Ht 5' 4"  (1.626 m)   Wt 153 lb 3.2 oz (69.5 kg)   SpO2 95%   BMI 26.30 kg/m      Assessment & Plan:  Peggy Hall comes in today with chief complaint of Medical Management of Chronic Issues   Diagnosis and orders addressed:  1. Controlled substance agreement signed - Tox[employer] Select 13 (MW), Urine - CMP14+EGFR - CBC with Differential/Platelet - traMADol (ULTRAM) 50 MG tablet; Take 1 tablet (50 mg total) by mouth every 8 (eight) hours as needed.  Dispense: 60 tablet; Refill: 2  2. Essential hypertension - CMP14+EGFR - CBC with Differential/Platelet  3. Hepatic steatosis - CMP14+EGFR - CBC with Differential/Platelet  4. Type 2 diabetes mellitus with other specified complication,  without long-term current use of insulin (HCC) - CMP14+EGFR - CBC with Differential/Platelet - Bayer DCA Hb A1c Waived  5. Hyperlipidemia associated with type 2 diabetes mellitus (HCC) - CMP14+EGFR - CBC with Differential/Platelet  6. Insomnia, unspecified type - CMP14+EGFR - CBC with Differential/Platelet  7. Hyperlipidemia, unspecified hyperlipidemia type - CMP14+EGFR - CBC with Differential/Platelet  8. Depression, unspecified depression type - CMP14+EGFR - CBC with Differential/Platelet  9. Fibromyalgia - CMP14+EGFR - CBC with Differential/Platelet - traMADol (ULTRAM) 50 MG tablet; Take 1 tablet (50 mg total) by mouth every 8 (eight) hours as needed.  Dispense: 60 tablet; Refill: 2  10. GAD (generalized anxiety disorder) - CMP14+EGFR - CBC with Differential/Platelet  11. Osteoarthritis of multiple joints, unspecified osteoarthritis type - CMP14+EGFR - CBC with Differential/Platelet - traMADol (ULTRAM) 50 MG tablet; Take 1 tablet (50 mg total) by mouth every 8 (eight) hours as needed.  Dispense: 60 tablet; Refill: 2   Labs pending Patient reviewed in [place] controlled database, no flags noted. Contract and drug screen are up to date.  Work note given  Health Maintenance reviewed Diet and exercise encouraged  Follow up plan: 3 months   Evelina Dun, FNP

## 2021-12-20 ENCOUNTER — Other Ambulatory Visit: Payer: Self-pay | Admitting: Family

## 2021-12-20 DIAGNOSIS — E875 Hyperkalemia: Secondary | ICD-10-CM

## 2021-12-20 LAB — CBC WITH DIFFERENTIAL/PLATELET
Basophils Absolute: 0.1 10*3/uL (ref 0.0–0.2)
Basos: 1 %
EOS (ABSOLUTE): 0.3 10*3/uL (ref 0.0–0.4)
Eos: 3 %
Hematocrit: 40 % (ref 34.0–46.6)
Hemoglobin: 13.5 g/dL (ref 11.1–15.9)
Immature Grans (Abs): 0.1 10*3/uL (ref 0.0–0.1)
Immature Granulocytes: 1 %
Lymphocytes Absolute: 2.3 10*3/uL (ref 0.7–3.1)
Lymphs: 23 %
MCH: 30.1 pg (ref 26.6–33.0)
MCHC: 33.8 g/dL (ref 31.5–35.7)
MCV: 89 fL (ref 79–97)
Monocytes Absolute: 0.8 10*3/uL (ref 0.1–0.9)
Monocytes: 8 %
Neutrophils Absolute: 6.6 10*3/uL (ref 1.4–7.0)
Neutrophils: 64 %
Platelets: 375 10*3/uL (ref 150–450)
RBC: 4.49 x10E6/uL (ref 3.77–5.28)
RDW: 12 % (ref 11.7–15.4)
WBC: 10.2 10*3/uL (ref 3.4–10.8)

## 2021-12-20 LAB — CMP14+EGFR
ALT: 45 IU/L — ABNORMAL HIGH (ref 0–32)
AST: 25 IU/L (ref 0–40)
Albumin/Globulin Ratio: 2.1 (ref 1.2–2.2)
Albumin: 4.7 g/dL (ref 3.8–4.9)
Alkaline Phosphatase: 72 IU/L (ref 44–121)
BUN/Creatinine Ratio: 18 (ref 9–23)
BUN: 14 mg/dL (ref 6–24)
Bilirubin Total: 0.5 mg/dL (ref 0.0–1.2)
CO2: 25 mmol/L (ref 20–29)
Calcium: 10.1 mg/dL (ref 8.7–10.2)
Chloride: 105 mmol/L (ref 96–106)
Creatinine, Ser: 0.78 mg/dL (ref 0.57–1.00)
Globulin, Total: 2.2 g/dL (ref 1.5–4.5)
Glucose: 102 mg/dL — ABNORMAL HIGH (ref 70–99)
Potassium: 5.5 mmol/L — ABNORMAL HIGH (ref 3.5–5.2)
Sodium: 144 mmol/L (ref 134–144)
Total Protein: 6.9 g/dL (ref 6.0–8.5)
eGFR: 87 mL/min/{1.73_m2} (ref >59)

## 2021-12-23 LAB — TOXASSURE SELECT 13 (MW), URINE

## 2021-12-26 DIAGNOSIS — K5900 Constipation, unspecified: Secondary | ICD-10-CM | POA: Diagnosis not present

## 2021-12-26 DIAGNOSIS — R197 Diarrhea, unspecified: Secondary | ICD-10-CM | POA: Diagnosis not present

## 2021-12-26 DIAGNOSIS — R1084 Generalized abdominal pain: Secondary | ICD-10-CM | POA: Diagnosis not present

## 2021-12-26 DIAGNOSIS — R109 Unspecified abdominal pain: Secondary | ICD-10-CM | POA: Diagnosis not present

## 2021-12-26 DIAGNOSIS — Z86010 Personal history of colonic polyps: Secondary | ICD-10-CM | POA: Diagnosis not present

## 2021-12-26 DIAGNOSIS — K529 Noninfective gastroenteritis and colitis, unspecified: Secondary | ICD-10-CM | POA: Diagnosis not present

## 2021-12-26 DIAGNOSIS — R152 Fecal urgency: Secondary | ICD-10-CM | POA: Diagnosis not present

## 2022-01-02 DIAGNOSIS — R109 Unspecified abdominal pain: Secondary | ICD-10-CM | POA: Diagnosis not present

## 2022-01-02 DIAGNOSIS — R197 Diarrhea, unspecified: Secondary | ICD-10-CM | POA: Diagnosis not present

## 2022-01-02 DIAGNOSIS — R152 Fecal urgency: Secondary | ICD-10-CM | POA: Diagnosis not present

## 2022-01-06 ENCOUNTER — Other Ambulatory Visit: Payer: Self-pay

## 2022-01-06 ENCOUNTER — Ambulatory Visit: Payer: BC Managed Care – PPO | Admitting: Family

## 2022-01-06 ENCOUNTER — Encounter: Payer: Self-pay | Admitting: Family

## 2022-01-06 ENCOUNTER — Telehealth: Payer: Self-pay | Admitting: Family

## 2022-01-06 ENCOUNTER — Other Ambulatory Visit: Payer: Self-pay | Admitting: *Deleted

## 2022-01-06 VITALS — BP 104/63 | HR 77 | Temp 97.6°F | Ht 64.0 in | Wt 154.2 lb

## 2022-01-06 DIAGNOSIS — R300 Dysuria: Secondary | ICD-10-CM | POA: Diagnosis not present

## 2022-01-06 DIAGNOSIS — I10 Essential (primary) hypertension: Secondary | ICD-10-CM

## 2022-01-06 DIAGNOSIS — E875 Hyperkalemia: Secondary | ICD-10-CM

## 2022-01-06 DIAGNOSIS — N3001 Acute cystitis with hematuria: Secondary | ICD-10-CM

## 2022-01-06 LAB — MICROSCOPIC EXAMINATION
Epithelial Cells (non renal): NONE SEEN /hpf (ref 0–10)
RBC, Urine: >30 /hpf — AB (ref 0–2)
Renal Epithel, UA: NONE SEEN /hpf

## 2022-01-06 LAB — URINALYSIS, COMPLETE
Bilirubin, UA: NEGATIVE
Ketones, UA: NEGATIVE
Nitrite, UA: POSITIVE — AB
Specific Gravity, UA: 1.015 (ref 1.005–1.030)
Urobilinogen, Ur: 0.2 mg/dL (ref 0.2–1.0)
pH, UA: 5.5 (ref 5.0–7.5)

## 2022-01-06 MED ORDER — CEPHALEXIN 500 MG PO CAPS: 500.0000 mg | ORAL_CAPSULE | Freq: Two times a day (BID) | ORAL | 0 refills | Status: DC

## 2022-01-06 NOTE — Addendum Note (Signed)
Addended by: Evelina Dun A on: 01/06/2022 04:08 PM   Modules accepted: Orders

## 2022-01-06 NOTE — Telephone Encounter (Signed)
Yes and patient aware

## 2022-01-06 NOTE — Patient Instructions (Signed)
Hyperkalemia Hyperkalemia occurs when the level of potassium in the blood is too high. Potassium is an important mineral (electrolyte) that helps the muscles and nerves function normally. It affects how the heart works, and it helps keep fluids and minerals balanced in the body. If there is too much potassium in your blood, it can affect your heart's ability to function normally. Potassium is normally removed (excreted) from the body by the kidneys. Hyperkalemia can result from various conditions. It can range from mild to severe. What are the causes? This condition may be caused by: Taking in too much potassium. This can happen if: You use salt substitutes. They contain large amounts of potassium. You take potassium supplements. You eat too many foods that are high in potassium if you have kidney disease. Excreting too little potassium. This can happen if: Your kidneys are not working properly. Kidney (renal) disease, including short-term or long-term renal failure, is a common cause of hyperkalemia. You are taking medicines that lower your excretion of potassium, such as ACE inhibitors, angiotensin II receptor blockers (ARBs), or potassium-sparing diuretics, such as spironolactone. You have Addison's disease. You have a urinary tract blockage, such as kidney stones. You are on treatment to mechanically clean your blood (dialysis) and you skip a treatment. Your cells releasing a high amount of potassium into the blood. This can happen with: Injury to muscles (rhabdomyolysis) or other tissues. Most potassium is stored in your muscles. Severe burns, injuries, or infections. Acidic blood plasma (acidosis). Acidosis can result from many diseases, such as uncontrolled diabetes. What increases the risk? You are more likely to develop this condition if you have alcoholism or if you use drugs heavily. What are the signs or symptoms? In many cases, there are no symptoms. However, when your potassium  level becomes high enough, you may have symptoms such as: An irregular or very slow heartbeat. Nausea. Tiredness (fatigue). Confusion. Tingling of your skin or numbness of your hands or feet. Muscle cramps. Muscle weakness. Not being able to move (paralysis). How is this diagnosed? This condition may be diagnosed based on: Your symptoms and medical history. Your health care provider will ask about your use of over-the-counter and prescription medicines. A physical exam. Blood tests. An electrocardiogram (ECG). How is this treated? Treatment depends on the cause and severity of your condition. Treatment may need to be done in the hospital setting. Treatment may include: Receiving a sugar solution (glucose) through an IV along with insulin to shift potassium out of your blood and into your cells. Taking a medicine called albuterol to shift potassium out of your blood and into your cells. Taking medicines to remove the potassium from your body. Having dialysis to remove the potassium from your body. Taking calcium to protect your heart from the effects of high potassium, such as irregular rhythms (arrhythmias). Follow these instructions at home:  Take over-the-counter and prescription medicines only as told by your health care provider. Do not take any supplements, natural food products, herbs, or vitamins without reviewing them with your health care provider. Certain supplements and natural food products contain high amounts of potassium. If you drink alcohol, limit how much you have as told by your health care provider. Do not use illegal drugs. If you need help quitting, ask your health care provider. If you have kidney disease, you may need to follow a low-potassium diet. A dietitian can help you learn which foods have high or low amounts of potassium. Keep all follow-up visits. This is important.   Contact a health care provider if: You have an irregular or very slow heartbeat. You  feel light-headed. You feel weak. You are nauseous. You have tingling or numbness in your hands or feet. Get help right away if: You have shortness of breath. You have chest pain or discomfort. You faint. You have muscle paralysis. These symptoms may be an emergency. Get help right away. Call 911. Do not wait to see if the symptoms will go away. Do not drive yourself to the hospital. Summary Hyperkalemia occurs when the level of potassium in your blood is too high. This condition may be caused by taking in too much potassium, excreting too little potassium, or releasing a high amount of potassium from your cells into your blood. Hyperkalemia can result from many underlying conditions, especially chronic kidney disease, or from taking certain medicines. Treatment of hyperkalemia may include medicine to shift potassium out of your blood and into your cells or to remove the potassium from your body. If you have kidney disease, you may need to follow a low-potassium diet. A dietitian can help you learn which foods have high or low amounts of potassium. This information is not intended to replace advice given to you by your health care provider. Make sure you discuss any questions you have with your health care provider. Document Revised: 02/21/2021 Document Reviewed: 02/21/2021 Elsevier Patient Education  2023 [employer]  

## 2022-01-06 NOTE — Progress Notes (Addendum)
   Subjective:    Patient ID: Peggy Hall, female    DOB: 1961-04-01, 61 y.o.   MRN: [id number]  Chief Complaint  Patient presents with   Follow-up   PT was seen a couple of weeks ago and found to have hyperkalemia. We stopped her Lisinopril 5 mg to see if this would help. Her BP is at goal today.   She reports she does not take any multivitamin or OTC potassium.  Hypertension This is a chronic problem. The current episode started more than 1 year ago. The problem has been resolved since onset. The problem is controlled. Associated symptoms include malaise/fatigue. Pertinent negatives include no peripheral edema or shortness of breath. Risk factors for coronary artery disease include dyslipidemia, diabetes mellitus and sedentary lifestyle. The current treatment provides moderate improvement.      Review of Systems  Constitutional:  Positive for malaise/fatigue.  Respiratory:  Negative for shortness of breath.   All other systems reviewed and are negative.      Objective:   Physical Exam Vitals reviewed.  Constitutional:      General: She is not in acute distress.    Appearance: She is well-developed.  HENT:     Head: Normocephalic and atraumatic.     Right Ear: Tympanic membrane normal.     Left Ear: Tympanic membrane normal.  Eyes:     Pupils: Pupils are equal, round, and reactive to light.  Neck:     Thyroid: No thyromegaly.  Cardiovascular:     Rate and Rhythm: Normal rate and regular rhythm.     Heart sounds: Normal heart sounds. No murmur heard. Pulmonary:     Effort: Pulmonary effort is normal. No respiratory distress.     Breath sounds: Normal breath sounds. No wheezing.  Abdominal:     General: Bowel sounds are normal. There is no distension.     Palpations: Abdomen is soft.     Tenderness: There is no abdominal tenderness.  Musculoskeletal:        General: No tenderness. Normal range of motion.     Cervical back: Normal range of motion and neck supple.   Skin:    General: Skin is warm and dry.  Neurological:     Mental Status: She is alert and oriented to person, place, and time.     Cranial Nerves: No cranial nerve deficit.     Deep Tendon Reflexes: Reflexes are normal and symmetric.  Psychiatric:        Behavior: Behavior normal.        Thought Content: Thought content normal.        Judgment: Judgment normal.     BP 104/63   Pulse 77   Temp 97.6 F (36.4 C)   Ht _0  (1.626 m)   Wt 154 lb 3.2 oz (69.9 kg)   SpO2 99%   BMI 26.47 kg/m       Assessment & Plan:  Peggy Hall comes in today with chief complaint of Follow-up   Diagnosis and orders addressed:  1. Hyperkalemia - BMP8+EGFR  2. Essential hypertension    3. Acute cystitis with hematuria Start Keflex     Labs pending Low potassium diet  Health Maintenance reviewed Diet and exercise encouraged  Follow up plan: Keep chronic follow up   Evelina Dun, FNP

## 2022-01-07 LAB — BMP8+EGFR
BUN/Creatinine Ratio: 16 (ref 9–23)
BUN: 13 mg/dL (ref 6–24)
CO2: 25 mmol/L (ref 20–29)
Calcium: 9.9 mg/dL (ref 8.7–10.2)
Chloride: 104 mmol/L (ref 96–106)
Creatinine, Ser: 0.81 mg/dL (ref 0.57–1.00)
Glucose: 145 mg/dL — ABNORMAL HIGH (ref 70–99)
Potassium: 5.5 mmol/L — ABNORMAL HIGH (ref 3.5–5.2)
Sodium: 143 mmol/L (ref 134–144)
eGFR: 84 mL/min/{1.73_m2} (ref >59)

## 2022-01-08 ENCOUNTER — Other Ambulatory Visit: Payer: Self-pay | Admitting: Family

## 2022-01-20 DIAGNOSIS — M150 Primary generalized (osteo)arthritis: Secondary | ICD-10-CM | POA: Diagnosis not present

## 2022-01-20 DIAGNOSIS — M797 Fibromyalgia: Secondary | ICD-10-CM | POA: Diagnosis not present

## 2022-01-20 DIAGNOSIS — Z791 Long term (current) use of non-steroidal anti-inflammatories (NSAID): Secondary | ICD-10-CM | POA: Diagnosis not present

## 2022-02-02 ENCOUNTER — Other Ambulatory Visit: Payer: Self-pay | Admitting: Family

## 2022-02-02 DIAGNOSIS — E1169 Type 2 diabetes mellitus with other specified complication: Secondary | ICD-10-CM

## 2022-03-04 ENCOUNTER — Other Ambulatory Visit: Payer: Self-pay | Admitting: Family

## 2022-03-04 DIAGNOSIS — E1169 Type 2 diabetes mellitus with other specified complication: Secondary | ICD-10-CM

## 2022-03-20 ENCOUNTER — Encounter: Payer: Self-pay | Admitting: Family

## 2022-03-20 ENCOUNTER — Ambulatory Visit: Payer: BC Managed Care – PPO | Admitting: Family

## 2022-03-20 VITALS — BP 135/76 | HR 73 | Temp 97.4°F | Ht 64.0 in | Wt 159.4 lb

## 2022-03-20 DIAGNOSIS — M159 Polyosteoarthritis, unspecified: Secondary | ICD-10-CM

## 2022-03-20 DIAGNOSIS — Z79899 Other long term (current) drug therapy: Secondary | ICD-10-CM

## 2022-03-20 DIAGNOSIS — K760 Fatty (change of) liver, not elsewhere classified: Secondary | ICD-10-CM | POA: Diagnosis not present

## 2022-03-20 DIAGNOSIS — Z0000 Encounter for general adult medical examination without abnormal findings: Secondary | ICD-10-CM | POA: Diagnosis not present

## 2022-03-20 DIAGNOSIS — M797 Fibromyalgia: Secondary | ICD-10-CM

## 2022-03-20 DIAGNOSIS — E1169 Type 2 diabetes mellitus with other specified complication: Secondary | ICD-10-CM | POA: Diagnosis not present

## 2022-03-20 DIAGNOSIS — Z0001 Encounter for general adult medical examination with abnormal findings: Secondary | ICD-10-CM | POA: Diagnosis not present

## 2022-03-20 DIAGNOSIS — E785 Hyperlipidemia, unspecified: Secondary | ICD-10-CM | POA: Diagnosis not present

## 2022-03-20 DIAGNOSIS — Z23 Encounter for immunization: Secondary | ICD-10-CM | POA: Diagnosis not present

## 2022-03-20 DIAGNOSIS — F411 Generalized anxiety disorder: Secondary | ICD-10-CM

## 2022-03-20 DIAGNOSIS — I10 Essential (primary) hypertension: Secondary | ICD-10-CM | POA: Diagnosis not present

## 2022-03-20 DIAGNOSIS — G4700 Insomnia, unspecified: Secondary | ICD-10-CM

## 2022-03-20 DIAGNOSIS — B3731 Acute candidiasis of vulva and vagina: Secondary | ICD-10-CM

## 2022-03-20 LAB — BAYER DCA HB A1C WAIVED: HB A1C (BAYER DCA - WAIVED): 7.3 % — ABNORMAL HIGH (ref 4.8–5.6)

## 2022-03-20 MED ORDER — TRAMADOL HCL 50 MG PO TABS: 50.0000 mg | ORAL_TABLET | Freq: Three times a day (TID) | ORAL | 2 refills | Status: DC | PRN

## 2022-03-20 MED ORDER — FLUCONAZOLE 150 MG PO TABS: 150.0000 mg | ORAL_TABLET | ORAL | 0 refills | Status: DC | PRN

## 2022-03-20 NOTE — Patient Instructions (Signed)

## 2022-03-20 NOTE — Progress Notes (Signed)
Subjective:    Patient ID: Peggy Hall, female    DOB: August 20, 1960, 61 y.o.   MRN: [id number]  Chief Complaint  Patient presents with   Medical Management of Chronic Issues    Wants something for yeast infections    Pt presents to the office today for CPE and chronic follow up. She is followed by Rheumatologists every 3 months for Fibromyalgia. She was started on Sulindac 200 mg BID. She started Cymbalta 60 mg .She continues to have bilateral shoulders, knee, and back pain that has improved.    She has hepatic steatosis and followed by GI.  Diabetes She presents for her follow-up diabetic visit. She has type 2 diabetes mellitus. Associated symptoms include fatigue. Pertinent negatives for diabetes include no blurred vision and no foot paresthesias. Symptoms are stable. Pertinent negatives for diabetic complications include no CVA. Risk factors for coronary artery disease include diabetes mellitus, dyslipidemia, hypertension and sedentary lifestyle. Her overall blood glucose range is 130-140 mg/dl. Eye exam is not current.  Hypertension This is a chronic problem. The current episode started more than 1 year ago. The problem has been resolved since onset. Pertinent negatives include no blurred vision, malaise/fatigue, peripheral edema or shortness of breath. Risk factors for coronary artery disease include diabetes mellitus, dyslipidemia, obesity and sedentary lifestyle. The current treatment provides moderate improvement. There is no history of CVA or heart failure.  Hyperlipidemia This is a chronic problem. The current episode started more than 1 year ago. The problem is controlled. Recent lipid tests were reviewed and are normal. Exacerbating diseases include obesity. Pertinent negatives include no shortness of breath. Current antihyperlipidemic treatment includes statins. The current treatment provides moderate improvement of lipids. Risk factors for coronary artery disease include diabetes  mellitus, dyslipidemia, hypertension, a sedentary lifestyle and post-menopausal.  Arthritis Presents for follow-up visit. She complains of pain and stiffness. The symptoms have been stable. Affected locations include the left shoulder, right shoulder, right hip, left hip, left MCP, right MCP, left knee and right knee. Her pain is at a severity of 7/10. Associated symptoms include fatigue.  Insomnia Primary symptoms: difficulty falling asleep, frequent awakening, no malaise/fatigue.   The current episode started more than one year. The onset quality is gradual. The problem occurs intermittently. Past treatments include medication. The treatment provided mild relief. PMH includes: depression.   Depression        This is a chronic problem.  The current episode started more than 1 year ago.   The onset quality is gradual.   The problem occurs intermittently.  Associated symptoms include fatigue and insomnia.  Associated symptoms include no helplessness and no hopelessness.  Past treatments include SNRIs - Serotonin and norepinephrine reuptake inhibitors. Vaginal Itching The patient's primary symptoms include genital itching. The patient's pertinent negatives include no missed menses. This is a chronic problem. The current episode started more than 1 year ago. The problem occurs intermittently. The problem has been unchanged. She has tried nothing for the symptoms.      Review of Systems  Constitutional:  Positive for fatigue. Negative for malaise/fatigue.  Eyes:  Negative for blurred vision.  Respiratory:  Negative for shortness of breath.   Genitourinary:  Negative for missed menses.  Musculoskeletal:  Positive for arthritis and stiffness.  Psychiatric/Behavioral:  Positive for depression. The patient has insomnia.   All other systems reviewed and are negative.      Objective:   Physical Exam Vitals reviewed.  Constitutional:      General: She  is not in acute distress.    Appearance: She  is well-developed.  HENT:     Head: Normocephalic and atraumatic.     Right Ear: Tympanic membrane normal.     Left Ear: Tympanic membrane normal.  Eyes:     Pupils: Pupils are equal, round, and reactive to light.  Neck:     Thyroid: No thyromegaly.  Cardiovascular:     Rate and Rhythm: Normal rate and regular rhythm.     Heart sounds: Normal heart sounds. No murmur heard. Pulmonary:     Effort: Pulmonary effort is normal. No respiratory distress.     Breath sounds: Normal breath sounds. No wheezing.  Abdominal:     General: Bowel sounds are normal. There is no distension.     Palpations: Abdomen is soft.     Tenderness: There is no abdominal tenderness.  Musculoskeletal:        General: No tenderness. Normal range of motion.     Cervical back: Normal range of motion and neck supple.  Skin:    General: Skin is warm and dry.  Neurological:     Mental Status: She is alert and oriented to person, place, and time.     Cranial Nerves: No cranial nerve deficit.     Deep Tendon Reflexes: Reflexes are normal and symmetric.  Psychiatric:        Behavior: Behavior normal.        Thought Content: Thought content normal.        Judgment: Judgment normal.       BP 135/76   Pulse 73   Temp (!) 97.4 F (36.3 C) (Temporal)   Ht _0  (1.626 m)   Wt 159 lb 6.4 oz (72.3 kg)   SpO2 99%   BMI 27.36 kg/m      Assessment & Plan:  Peggy Hall comes in today with chief complaint of Medical Management of Chronic Issues (Wants something for yeast infections )   Diagnosis and orders addressed:  1. Need for immunization against influenza - Flu Vaccine QUAD 34moIM (Fluarix, Fluzone & Alfiuria Quad PF) - CMP14+EGFR - CBC with Differential/Platelet  2. Essential hypertension - CMP14+EGFR - CBC with Differential/Platelet  3. Hepatic steatosis - CMP14+EGFR - CBC with Differential/Platelet  4. Type 2 diabetes mellitus with other specified complication, without long-term current use  of insulin (HCC) - Bayer DCA Hb A1c Waived - CMP14+EGFR - CBC with Differential/Platelet - Microalbumin / creatinine urine ratio  5. Hyperlipidemia associated with type 2 diabetes mellitus (HCC) - CMP14+EGFR - CBC with Differential/Platelet  6. Osteoarthritis of multiple joints, unspecified osteoarthritis type - traMADol (ULTRAM) 50 MG tablet; Take 1 tablet (50 mg total) by mouth every 8 (eight) hours as needed.  Dispense: 60 tablet; Refill: 2 - CMP14+EGFR - CBC with Differential/Platelet  7. Fibromyalgia - traMADol (ULTRAM) 50 MG tablet; Take 1 tablet (50 mg total) by mouth every 8 (eight) hours as needed.  Dispense: 60 tablet; Refill: 2 - CMP14+EGFR - CBC with Differential/Platelet  8. Hyperlipidemia, unspecified hyperlipidemia type - CMP14+EGFR - CBC with Differential/Platelet  9. GAD (generalized anxiety disorder) - CMP14+EGFR - CBC with Differential/Platelet  10. Insomnia, unspecified type - CMP14+EGFR - CBC with Differential/Platelet  11. Controlled substance agreement signed - traMADol (ULTRAM) 50 MG tablet; Take 1 tablet (50 mg total) by mouth every 8 (eight) hours as needed.  Dispense: 60 tablet; Refill: 2 - CMP14+EGFR - CBC with Differential/Platelet  12. Annual physical exam - Bayer DCA Hb A1c Waived -  CMP14+EGFR - CBC with Differential/Platelet - Microalbumin / creatinine urine ratio - TSH - Lipid panel  13. Vagina, candidiasis - fluconazole (DIFLUCAN) 150 MG tablet; Take 1 tablet (150 mg total) by mouth every three (3) days as needed.  Dispense: 3 tablet; Refill: 0   Labs pending Health Maintenance reviewed Diet and exercise encouraged  Follow up plan: 3 months    Evelina Dun, FNP

## 2022-03-21 ENCOUNTER — Other Ambulatory Visit: Payer: Self-pay | Admitting: Family

## 2022-03-21 ENCOUNTER — Other Ambulatory Visit: Payer: Self-pay | Admitting: Family Medicine

## 2022-03-21 ENCOUNTER — Telehealth: Payer: Self-pay | Admitting: Family

## 2022-03-21 ENCOUNTER — Ambulatory Visit: Payer: BC Managed Care – PPO | Admitting: Family

## 2022-03-21 DIAGNOSIS — E875 Hyperkalemia: Secondary | ICD-10-CM

## 2022-03-21 LAB — CBC WITH DIFFERENTIAL/PLATELET
Basophils Absolute: 0.1 10*3/uL (ref 0.0–0.2)
Basos: 1 %
EOS (ABSOLUTE): 0.2 10*3/uL (ref 0.0–0.4)
Eos: 3 %
Hematocrit: 42 % (ref 34.0–46.6)
Hemoglobin: 13.7 g/dL (ref 11.1–15.9)
Immature Grans (Abs): 0.1 10*3/uL (ref 0.0–0.1)
Immature Granulocytes: 1 %
Lymphocytes Absolute: 2.5 10*3/uL (ref 0.7–3.1)
Lymphs: 27 %
MCH: 29.6 pg (ref 26.6–33.0)
MCHC: 32.6 g/dL (ref 31.5–35.7)
MCV: 91 fL (ref 79–97)
Monocytes Absolute: 0.7 10*3/uL (ref 0.1–0.9)
Monocytes: 7 %
Neutrophils Absolute: 5.7 10*3/uL (ref 1.4–7.0)
Neutrophils: 61 %
Platelets: 343 10*3/uL (ref 150–450)
RBC: 4.63 x10E6/uL (ref 3.77–5.28)
RDW: 11.8 % (ref 11.7–15.4)
WBC: 9.2 10*3/uL (ref 3.4–10.8)

## 2022-03-21 LAB — MICROALBUMIN / CREATININE URINE RATIO
Creatinine, Urine: 52.9 mg/dL
Microalb/Creat Ratio: 27 mg/g creat (ref 0–29)
Microalbumin, Urine: 14.2 ug/mL

## 2022-03-21 LAB — CMP14+EGFR
ALT: 38 IU/L — ABNORMAL HIGH (ref 0–32)
AST: 21 IU/L (ref 0–40)
Albumin/Globulin Ratio: 2.3 — ABNORMAL HIGH (ref 1.2–2.2)
Albumin: 4.5 g/dL (ref 3.8–4.9)
Alkaline Phosphatase: 86 IU/L (ref 44–121)
BUN/Creatinine Ratio: 17 (ref 12–28)
BUN: 16 mg/dL (ref 8–27)
Bilirubin Total: 0.3 mg/dL (ref 0.0–1.2)
CO2: 24 mmol/L (ref 20–29)
Calcium: 9.8 mg/dL (ref 8.7–10.3)
Chloride: 101 mmol/L (ref 96–106)
Creatinine, Ser: 0.95 mg/dL (ref 0.57–1.00)
Globulin, Total: 2 g/dL (ref 1.5–4.5)
Glucose: 138 mg/dL — ABNORMAL HIGH (ref 70–99)
Potassium: 5.9 mmol/L (ref 3.5–5.2)
Sodium: 141 mmol/L (ref 134–144)
Total Protein: 6.5 g/dL (ref 6.0–8.5)
eGFR: 69 mL/min/{1.73_m2} (ref >59)

## 2022-03-21 LAB — LIPID PANEL
Chol/HDL Ratio: 3.4 ratio (ref 0.0–4.4)
Cholesterol, Total: 135 mg/dL (ref 100–199)
HDL: 40 mg/dL (ref >39)
LDL Chol Calc (NIH): 68 mg/dL (ref 0–99)
Triglycerides: 157 mg/dL — ABNORMAL HIGH (ref 0–149)
VLDL Cholesterol Cal: 27 mg/dL (ref 5–40)

## 2022-03-21 LAB — TSH: TSH: 1.98 u[IU]/mL (ref 0.450–4.500)

## 2022-03-21 MED ORDER — SODIUM POLYSTYRENE SULFONATE 15 GM/60ML PO SUSP: 30.0000 g | Freq: Every day | ORAL | 0 refills | Status: DC

## 2022-03-21 MED ORDER — SODIUM POLYSTYRENE SULFONATE PO POWD: Freq: Once | ORAL | 1 refills | Status: DC

## 2022-03-21 NOTE — Telephone Encounter (Signed)
[facility] called to give Critical Lab Result.  Potassium 5.9

## 2022-03-21 NOTE — Telephone Encounter (Signed)
See result note.  

## 2022-03-24 ENCOUNTER — Other Ambulatory Visit: Payer: BC Managed Care – PPO

## 2022-03-24 DIAGNOSIS — E875 Hyperkalemia: Secondary | ICD-10-CM | POA: Diagnosis not present

## 2022-03-24 LAB — CMP14+EGFR
ALT: 36 IU/L — ABNORMAL HIGH (ref 0–32)
AST: 23 IU/L (ref 0–40)
Albumin/Globulin Ratio: 1.9 (ref 1.2–2.2)
Albumin: 4.4 g/dL (ref 3.8–4.9)
Alkaline Phosphatase: 76 IU/L (ref 44–121)
BUN/Creatinine Ratio: 17 (ref 12–28)
BUN: 13 mg/dL (ref 8–27)
Bilirubin Total: 0.4 mg/dL (ref 0.0–1.2)
CO2: 22 mmol/L (ref 20–29)
Calcium: 9.8 mg/dL (ref 8.7–10.3)
Chloride: 103 mmol/L (ref 96–106)
Creatinine, Ser: 0.76 mg/dL (ref 0.57–1.00)
Globulin, Total: 2.3 g/dL (ref 1.5–4.5)
Glucose: 157 mg/dL — ABNORMAL HIGH (ref 70–99)
Potassium: 5.2 mmol/L (ref 3.5–5.2)
Sodium: 144 mmol/L (ref 134–144)
Total Protein: 6.7 g/dL (ref 6.0–8.5)
eGFR: 90 mL/min/{1.73_m2} (ref >59)

## 2022-03-27 DIAGNOSIS — R152 Fecal urgency: Secondary | ICD-10-CM | POA: Diagnosis not present

## 2022-03-27 DIAGNOSIS — R197 Diarrhea, unspecified: Secondary | ICD-10-CM | POA: Diagnosis not present

## 2022-04-06 ENCOUNTER — Other Ambulatory Visit: Payer: Self-pay | Admitting: Family

## 2022-04-09 DIAGNOSIS — Z1231 Encounter for screening mammogram for malignant neoplasm of breast: Secondary | ICD-10-CM | POA: Diagnosis not present

## 2022-04-21 DIAGNOSIS — H5022 Vertical strabismus, left eye: Secondary | ICD-10-CM | POA: Diagnosis not present

## 2022-04-21 DIAGNOSIS — E119 Type 2 diabetes mellitus without complications: Secondary | ICD-10-CM | POA: Diagnosis not present

## 2022-04-21 DIAGNOSIS — H25813 Combined forms of age-related cataract, bilateral: Secondary | ICD-10-CM | POA: Diagnosis not present

## 2022-04-21 LAB — HM DIABETES EYE EXAM

## 2022-04-22 DIAGNOSIS — Z791 Long term (current) use of non-steroidal anti-inflammatories (NSAID): Secondary | ICD-10-CM | POA: Diagnosis not present

## 2022-04-22 DIAGNOSIS — M150 Primary generalized (osteo)arthritis: Secondary | ICD-10-CM | POA: Diagnosis not present

## 2022-04-22 DIAGNOSIS — M797 Fibromyalgia: Secondary | ICD-10-CM | POA: Diagnosis not present

## 2022-05-06 ENCOUNTER — Other Ambulatory Visit: Payer: Self-pay | Admitting: Family

## 2022-05-06 DIAGNOSIS — E1169 Type 2 diabetes mellitus with other specified complication: Secondary | ICD-10-CM

## 2022-06-13 ENCOUNTER — Encounter: Payer: Self-pay | Admitting: Family

## 2022-06-13 ENCOUNTER — Ambulatory Visit: Payer: BC Managed Care – PPO | Admitting: Family

## 2022-06-13 ENCOUNTER — Other Ambulatory Visit: Payer: Self-pay | Admitting: Family

## 2022-06-13 VITALS — BP 126/74 | HR 73 | Temp 97.3°F | Ht 64.0 in | Wt 161.0 lb

## 2022-06-13 DIAGNOSIS — F32A Depression, unspecified: Secondary | ICD-10-CM | POA: Diagnosis not present

## 2022-06-13 DIAGNOSIS — F411 Generalized anxiety disorder: Secondary | ICD-10-CM | POA: Diagnosis not present

## 2022-06-13 DIAGNOSIS — E785 Hyperlipidemia, unspecified: Secondary | ICD-10-CM

## 2022-06-13 DIAGNOSIS — E1169 Type 2 diabetes mellitus with other specified complication: Secondary | ICD-10-CM

## 2022-06-13 DIAGNOSIS — M159 Polyosteoarthritis, unspecified: Secondary | ICD-10-CM

## 2022-06-13 DIAGNOSIS — M797 Fibromyalgia: Secondary | ICD-10-CM

## 2022-06-13 DIAGNOSIS — I10 Essential (primary) hypertension: Secondary | ICD-10-CM

## 2022-06-13 DIAGNOSIS — G4700 Insomnia, unspecified: Secondary | ICD-10-CM

## 2022-06-13 LAB — CMP14+EGFR
ALT: 36 IU/L — ABNORMAL HIGH (ref 0–32)
AST: 19 IU/L (ref 0–40)
Albumin/Globulin Ratio: 2 (ref 1.2–2.2)
Albumin: 4.3 g/dL (ref 3.8–4.9)
Alkaline Phosphatase: 86 IU/L (ref 44–121)
BUN/Creatinine Ratio: 18 (ref 12–28)
BUN: 17 mg/dL (ref 8–27)
Bilirubin Total: 0.4 mg/dL (ref 0.0–1.2)
CO2: 24 mmol/L (ref 20–29)
Calcium: 9.6 mg/dL (ref 8.7–10.3)
Chloride: 103 mmol/L (ref 96–106)
Creatinine, Ser: 0.93 mg/dL (ref 0.57–1.00)
Globulin, Total: 2.2 g/dL (ref 1.5–4.5)
Glucose: 186 mg/dL — ABNORMAL HIGH (ref 70–99)
Potassium: 5.1 mmol/L (ref 3.5–5.2)
Sodium: 142 mmol/L (ref 134–144)
Total Protein: 6.5 g/dL (ref 6.0–8.5)
eGFR: 70 mL/min/{1.73_m2} (ref >59)

## 2022-06-13 LAB — BAYER DCA HB A1C WAIVED: HB A1C (BAYER DCA - WAIVED): 10.8 % — ABNORMAL HIGH (ref 4.8–5.6)

## 2022-06-13 MED ORDER — QUETIAPINE FUMARATE 25 MG PO TABS: 25.0000 mg | ORAL_TABLET | Freq: Every day | ORAL | 0 refills | Status: DC

## 2022-06-13 NOTE — Progress Notes (Signed)
Subjective:    Patient ID: Peggy Hall, female    DOB: 1961/03/24, 61 y.o.   MRN: [id number]  Chief Complaint  Patient presents with   Medical Management of Chronic Issues   Pt presents to the office today for chronic follow up. She is followed by Rheumatologists every 3 months for Fibromyalgia. She was started on Sulindac 200 mg BID. She started Cymbalta 60 mg .She continues to have bilateral shoulders, knee, and back pain that has improved.    She has hepatic steatosis and followed by GI.   Husband states she has been withdrawn and decreased concentration since starting the Cymbalta or seroquel. Husband reports she is acting like a zobie.   Hypertension This is a chronic problem. The current episode started more than 1 year ago. The problem has been resolved since onset. The problem is controlled. Associated symptoms include anxiety and malaise/fatigue. Pertinent negatives include no blurred vision, peripheral edema or shortness of breath. Risk factors for coronary artery disease include diabetes mellitus, dyslipidemia and sedentary lifestyle. The current treatment provides moderate improvement.  Diabetes She presents for her follow-up diabetic visit. She has type 2 diabetes mellitus. Hypoglycemia symptoms include nervousness/anxiousness. Associated symptoms include fatigue. Pertinent negatives for diabetes include no blurred vision and no foot paresthesias. Symptoms are stable. Risk factors for coronary artery disease include dyslipidemia, diabetes mellitus, hypertension, sedentary lifestyle and post-menopausal. She is following a generally unhealthy diet. Her overall blood glucose range is 180-200 mg/dl. Eye exam is current.  Hyperlipidemia This is a chronic problem. The current episode started more than 1 year ago. The problem is controlled. Recent lipid tests were reviewed and are normal. Pertinent negatives include no shortness of breath. Current antihyperlipidemic treatment includes  statins. The current treatment provides moderate improvement of lipids. Risk factors for coronary artery disease include dyslipidemia, diabetes mellitus, hypertension and a sedentary lifestyle.  Arthritis Presents for follow-up visit. She complains of pain and stiffness. The symptoms have been worsening. Affected locations include the right MCP, left MCP, left knee, right shoulder, left shoulder, right hip, left hip and right knee (back). Her pain is at a severity of 8/10. Associated symptoms include fatigue.  Insomnia Primary symptoms: difficulty falling asleep, frequent awakening, malaise/fatigue.   The current episode started more than one year. The onset quality is gradual. Past treatments include medication. The treatment provided moderate relief. PMH includes: depression.   Anxiety Presents for follow-up visit. Symptoms include excessive worry, insomnia, irritability and nervous/anxious behavior. Patient reports no shortness of breath. Symptoms occur occasionally. The severity of symptoms is moderate.    Depression        This is a chronic problem.  The current episode started more than 1 year ago.   The onset quality is gradual.   The problem occurs intermittently.  Associated symptoms include fatigue, helplessness, hopelessness, insomnia, decreased interest, appetite change and sad.  Past treatments include SNRIs - Serotonin and norepinephrine reuptake inhibitors.  Past medical history includes anxiety.       Review of Systems  Constitutional:  Positive for appetite change, fatigue, irritability and malaise/fatigue.  Eyes:  Negative for blurred vision.  Respiratory:  Negative for shortness of breath.   Musculoskeletal:  Positive for arthritis and stiffness.  Psychiatric/Behavioral:  Positive for depression. The patient is nervous/anxious and has insomnia.   All other systems reviewed and are negative.      Objective:   Physical Exam Vitals reviewed.  Constitutional:       General: She is not in  acute distress.    Appearance: She is well-developed.  HENT:     Head: Normocephalic and atraumatic.     Right Ear: Tympanic membrane normal.     Left Ear: Tympanic membrane normal.  Eyes:     Pupils: Pupils are equal, round, and reactive to light.  Neck:     Thyroid: No thyromegaly.  Cardiovascular:     Rate and Rhythm: Normal rate and regular rhythm.     Heart sounds: Normal heart sounds. No murmur heard. Pulmonary:     Effort: Pulmonary effort is normal. No respiratory distress.     Breath sounds: Normal breath sounds. No wheezing.  Abdominal:     General: Bowel sounds are normal. There is no distension.     Palpations: Abdomen is soft.     Tenderness: There is no abdominal tenderness.  Musculoskeletal:        General: No tenderness. Normal range of motion.     Cervical back: Normal range of motion and neck supple.  Skin:    General: Skin is warm and dry.  Neurological:     Mental Status: She is alert and oriented to person, place, and time.     Cranial Nerves: No cranial nerve deficit.     Deep Tendon Reflexes: Reflexes are normal and symmetric.  Psychiatric:        Behavior: Behavior normal.        Thought Content: Thought content normal.        Judgment: Judgment normal.       BP 126/74   Pulse 73   Temp (!) 97.3 F (36.3 C) (Temporal)   Ht _0  (1.626 m)   Wt 161 lb (73 kg)   SpO2 99%   BMI 27.64 kg/m      Assessment & Plan:  Cadynce Garrette comes in today with chief complaint of Medical Management of Chronic Issues   Diagnosis and orders addressed:  1. Depression, unspecified depression type Will decrease seroquel to 25 mg from 50 mg for at least two weeks  Stress management  - QUEtiapine (SEROQUEL) 25 MG tablet; Take 1 tablet (25 mg total) by mouth at bedtime.  Dispense: 90 tablet; Refill: 0 - CMP14+EGFR  2. Essential hypertension - CMP14+EGFR  3. Fibromyalgia - CMP14+EGFR  4. GAD (generalized anxiety disorder) -  QUEtiapine (SEROQUEL) 25 MG tablet; Take 1 tablet (25 mg total) by mouth at bedtime.  Dispense: 90 tablet; Refill: 0 - CMP14+EGFR  5. Hyperlipidemia, unspecified hyperlipidemia type - CMP14+EGFR  6. Hyperlipidemia associated with type 2 diabetes mellitus (HCC) - CMP14+EGFR  7. Insomnia, unspecified type - QUEtiapine (SEROQUEL) 25 MG tablet; Take 1 tablet (25 mg total) by mouth at bedtime.  Dispense: 90 tablet; Refill: 0 - CMP14+EGFR  8. Osteoarthritis of multiple joints, unspecified osteoarthritis type - CMP14+EGFR  9. Type 2 diabetes mellitus with other specified complication, without long-term current use of insulin (HCC) - CMP14+EGFR - Bayer [place] Hb A1c Waived   Labs pending Health Maintenance reviewed Diet and exercise encouraged  Follow up plan: 1 month   Evelina Dun, FNP

## 2022-06-17 ENCOUNTER — Other Ambulatory Visit: Payer: Self-pay | Admitting: Family

## 2022-06-17 MED ORDER — TRULICITY 1.5 MG/0.5ML ~~LOC~~ SOAJ: 1.5000 mg | SUBCUTANEOUS | 2 refills | Status: DC

## 2022-07-08 ENCOUNTER — Other Ambulatory Visit: Payer: Self-pay | Admitting: Family

## 2022-07-14 ENCOUNTER — Encounter: Payer: Self-pay | Admitting: Family

## 2022-07-14 ENCOUNTER — Ambulatory Visit: Payer: BC Managed Care – PPO | Admitting: Family

## 2022-07-14 VITALS — BP 139/85 | HR 73 | Temp 97.1°F | Ht 64.0 in | Wt 157.4 lb

## 2022-07-14 DIAGNOSIS — F32A Depression, unspecified: Secondary | ICD-10-CM

## 2022-07-14 DIAGNOSIS — G4700 Insomnia, unspecified: Secondary | ICD-10-CM

## 2022-07-14 DIAGNOSIS — F411 Generalized anxiety disorder: Secondary | ICD-10-CM

## 2022-07-14 DIAGNOSIS — E1169 Type 2 diabetes mellitus with other specified complication: Secondary | ICD-10-CM | POA: Diagnosis not present

## 2022-07-14 MED ORDER — DESVENLAFAXINE SUCCINATE ER 100 MG PO TB24: 100.0000 mg | ORAL_TABLET | Freq: Every day | ORAL | 1 refills | Status: DC

## 2022-07-14 NOTE — Patient Instructions (Signed)
Insomnia Insomnia is a sleep disorder that makes it difficult to fall asleep or stay asleep. Insomnia can cause fatigue, low energy, difficulty concentrating, mood swings, and poor performance at work or school. There are three different ways to classify insomnia: Difficulty falling asleep. Difficulty staying asleep. Waking up too early in the morning. Any type of insomnia can be long-term (chronic) or short-term (acute). Both are common. Short-term insomnia usually lasts for 3 months or less. Chronic insomnia occurs at least three times a week for longer than 3 months. What are the causes? Insomnia may be caused by another condition, situation, or substance, such as: Having certain mental health conditions, such as anxiety and depression. Using caffeine, alcohol, tobacco, or drugs. Having gastrointestinal conditions, such as gastroesophageal reflux disease (GERD). Having certain medical conditions. These include: Asthma. Alzheimer's disease. Stroke. Chronic pain. An overactive thyroid gland (hyperthyroidism). Other sleep disorders, such as restless legs syndrome and sleep apnea. Menopause. Sometimes, the cause of insomnia may not be known. What increases the risk? Risk factors for insomnia include: Gender. Females are affected more often than males. Age. Insomnia is more common as people get older. Stress and certain medical and mental health conditions. Lack of exercise. Having an irregular work schedule. This may include working night shifts and traveling between different time zones. What are the signs or symptoms? If you have insomnia, the main symptom is having trouble falling asleep or having trouble staying asleep. This may lead to other symptoms, such as: Feeling tired or having low energy. Feeling nervous about going to sleep. Not feeling rested in the morning. Having trouble concentrating. Feeling irritable, anxious, or depressed. How is this diagnosed? This condition  may be diagnosed based on: Your symptoms and medical history. Your health care provider may ask about: Your sleep habits. Any medical conditions you have. Your mental health. A physical exam. How is this treated? Treatment for insomnia depends on the cause. Treatment may focus on treating an underlying condition that is causing the insomnia. Treatment may also include: Medicines to help you sleep. Counseling or therapy. Lifestyle adjustments to help you sleep better. Follow these instructions at home: Eating and drinking  Limit or avoid alcohol, caffeinated beverages, and products that contain nicotine and tobacco, especially close to bedtime. These can disrupt your sleep. Do not eat a large meal or eat spicy foods right before bedtime. This can lead to digestive discomfort that can make it hard for you to sleep. Sleep habits  Keep a sleep diary to help you and your health care provider figure out what could be causing your insomnia. Write down: When you sleep. When you wake up during the night. How well you sleep and how rested you feel the next day. Any side effects of medicines you are taking. What you eat and drink. Make your bedroom a dark, comfortable place where it is easy to fall asleep. Put up shades or blackout curtains to block light from outside. Use a white noise machine to block noise. Keep the temperature cool. Limit screen use before bedtime. This includes: Not watching TV. Not using your smartphone, tablet, or computer. Stick to a routine that includes going to bed and waking up at the same times every day and night. This can help you fall asleep faster. Consider making a quiet activity, such as reading, part of your nighttime routine. Try to avoid taking naps during the day so that you sleep better at night. Get out of bed if you are still awake after   15 minutes of trying to sleep. Keep the lights down, but try reading or doing a quiet activity. When you feel  sleepy, go back to bed. General instructions Take over-the-counter and prescription medicines only as told by your health care provider. Exercise regularly as told by your health care provider. However, avoid exercising in the hours right before bedtime. Use relaxation techniques to manage stress. Ask your health care provider to suggest some techniques that may work well for you. These may include: Breathing exercises. Routines to release muscle tension. Visualizing peaceful scenes. Make sure that you drive carefully. Do not drive if you feel very sleepy. Keep all follow-up visits. This is important. Contact a health care provider if: You are tired throughout the day. You have trouble in your daily routine due to sleepiness. You continue to have sleep problems, or your sleep problems get worse. Get help right away if: You have thoughts about hurting yourself or someone else. Get help right away if you feel like you may hurt yourself or others, or have thoughts about taking your own life. Go to your nearest emergency room or: Call 911. Call the National Suicide Prevention Lifeline at 1-800-273-8255 or 988. This is open 24 hours a day. Text the Crisis Text Line at 741741. Summary Insomnia is a sleep disorder that makes it difficult to fall asleep or stay asleep. Insomnia can be long-term (chronic) or short-term (acute). Treatment for insomnia depends on the cause. Treatment may focus on treating an underlying condition that is causing the insomnia. Keep a sleep diary to help you and your health care provider figure out what could be causing your insomnia. This information is not intended to replace advice given to you by your health care provider. Make sure you discuss any questions you have with your health care provider. Document Revised: 05/20/2021 Document Reviewed: 05/20/2021 Elsevier Patient Education  2023 Elsevier Inc.  

## 2022-07-14 NOTE — Progress Notes (Signed)
Subjective:    Patient ID: Peggy Hall, female    DOB: 1961-01-20, 62 y.o.   MRN: [id number]  Chief Complaint  Patient presents with   Follow-up   Depression   Pt presents to the office today to follow up on depression and GAD. Last visit we decreased her Seroquel to 25 mg from 50 mg for two weeks then has stopped. Reports since stopping she has been able to concentration better and being able to remember things.   Reports she continues to have insomnia.   Her A1C was also elevated. We increased her Trulicity to 1.5 mg.  Depression        This is a chronic problem.  The current episode started more than 1 year ago.   The problem occurs intermittently.  Associated symptoms include decreased concentration, fatigue, insomnia, decreased interest and sad.  Associated symptoms include no helplessness and no hopelessness.  Past treatments include SNRIs - Serotonin and norepinephrine reuptake inhibitors.  Past medical history includes anxiety.   Anxiety Presents for follow-up visit. Symptoms include decreased concentration, depressed mood, excessive worry, insomnia and nervous/anxious behavior. Symptoms occur most days. The severity of symptoms is mild.    Diabetes She presents for her follow-up diabetic visit. She has type 2 diabetes mellitus. Hypoglycemia symptoms include nervousness/anxiousness. Associated symptoms include fatigue. Risk factors for coronary artery disease include diabetes mellitus, dyslipidemia, hypertension and sedentary lifestyle. She is following a generally healthy diet. Her overall blood glucose range is 110-130 mg/dl.      Review of Systems  Constitutional:  Positive for fatigue.  Psychiatric/Behavioral:  Positive for decreased concentration and depression. The patient is nervous/anxious and has insomnia.   All other systems reviewed and are negative.      Objective:   Physical Exam Vitals reviewed.  Constitutional:      General: She is not in acute  distress.    Appearance: She is well-developed.  HENT:     Head: Normocephalic and atraumatic.     Right Ear: Tympanic membrane normal.     Left Ear: Tympanic membrane normal.  Eyes:     Pupils: Pupils are equal, round, and reactive to light.  Neck:     Thyroid: No thyromegaly.  Cardiovascular:     Rate and Rhythm: Normal rate and regular rhythm.     Heart sounds: Normal heart sounds. No murmur heard. Pulmonary:     Effort: Pulmonary effort is normal. No respiratory distress.     Breath sounds: Normal breath sounds. No wheezing.  Abdominal:     General: Bowel sounds are normal. There is no distension.     Palpations: Abdomen is soft.     Tenderness: There is no abdominal tenderness.  Musculoskeletal:        General: No tenderness. Normal range of motion.     Cervical back: Normal range of motion and neck supple.  Skin:    General: Skin is warm and dry.  Neurological:     Mental Status: She is alert and oriented to person, place, and time.     Cranial Nerves: No cranial nerve deficit.     Deep Tendon Reflexes: Reflexes are normal and symmetric.  Psychiatric:        Behavior: Behavior normal.        Thought Content: Thought content normal.        Judgment: Judgment normal.     BP 139/85   Pulse 73   Temp (!) 97.1 F (36.2 C) (Temporal)   Ht  $'5\' 4"'F$  (1.626 m)   Wt 157 lb 6.4 oz (71.4 kg)   SpO2 98%   BMI 27.02 kg/m        Assessment & Plan:  Peggy Hall comes in today with chief complaint of Follow-up and Depression   Diagnosis and orders addressed:  1. Depression, unspecified depression type - desvenlafaxine (PRISTIQ) 100 MG 24 hr tablet; Take 1 tablet (100 mg total) by mouth daily.  Dispense: 90 tablet; Refill: 1  2. GAD (generalized anxiety disorder) - desvenlafaxine (PRISTIQ) 100 MG 24 hr tablet; Take 1 tablet (100 mg total) by mouth daily.  Dispense: 90 tablet; Refill: 1  3. Insomnia, unspecified type - desvenlafaxine (PRISTIQ) 100 MG 24 hr tablet; Take 1  tablet (100 mg total) by mouth daily.  Dispense: 90 tablet; Refill: 1  4. Type 2 diabetes mellitus with other specified complication, without long-term current use of insulin (HCC)   Will stop Cymbalta and start Pristiq 100 mg  Stress management  Continue low carb diet  Continue medications   Evelina Dun, FNP

## 2022-07-22 DIAGNOSIS — M150 Primary generalized (osteo)arthritis: Secondary | ICD-10-CM | POA: Diagnosis not present

## 2022-07-22 DIAGNOSIS — M797 Fibromyalgia: Secondary | ICD-10-CM | POA: Diagnosis not present

## 2022-07-22 DIAGNOSIS — Z791 Long term (current) use of non-steroidal anti-inflammatories (NSAID): Secondary | ICD-10-CM | POA: Diagnosis not present

## 2022-07-31 ENCOUNTER — Telehealth: Payer: Self-pay | Admitting: Family

## 2022-08-05 ENCOUNTER — Other Ambulatory Visit: Payer: Self-pay | Admitting: Family

## 2022-08-05 DIAGNOSIS — E1169 Type 2 diabetes mellitus with other specified complication: Secondary | ICD-10-CM

## 2022-08-14 ENCOUNTER — Ambulatory Visit: Payer: No Typology Code available for payment source | Admitting: Family

## 2022-08-14 ENCOUNTER — Encounter: Payer: Self-pay | Admitting: Family

## 2022-08-14 VITALS — BP 139/80 | HR 82 | Temp 97.6°F | Ht 64.0 in | Wt 158.0 lb

## 2022-08-14 DIAGNOSIS — I10 Essential (primary) hypertension: Secondary | ICD-10-CM

## 2022-08-14 DIAGNOSIS — E1169 Type 2 diabetes mellitus with other specified complication: Secondary | ICD-10-CM | POA: Diagnosis not present

## 2022-08-14 DIAGNOSIS — F411 Generalized anxiety disorder: Secondary | ICD-10-CM | POA: Diagnosis not present

## 2022-08-14 DIAGNOSIS — E785 Hyperlipidemia, unspecified: Secondary | ICD-10-CM

## 2022-08-14 LAB — CMP14+EGFR
ALT: 34 IU/L — ABNORMAL HIGH (ref 0–32)
AST: 18 IU/L (ref 0–40)
Albumin/Globulin Ratio: 1.9 (ref 1.2–2.2)
Albumin: 4.3 g/dL (ref 3.8–4.9)
Alkaline Phosphatase: 80 IU/L (ref 44–121)
BUN/Creatinine Ratio: 26 (ref 12–28)
BUN: 22 mg/dL (ref 8–27)
Bilirubin Total: 0.4 mg/dL (ref 0.0–1.2)
CO2: 24 mmol/L (ref 20–29)
Calcium: 9.8 mg/dL (ref 8.7–10.3)
Chloride: 100 mmol/L (ref 96–106)
Creatinine, Ser: 0.85 mg/dL (ref 0.57–1.00)
Globulin, Total: 2.3 g/dL (ref 1.5–4.5)
Glucose: 168 mg/dL — ABNORMAL HIGH (ref 70–99)
Potassium: 5.1 mmol/L (ref 3.5–5.2)
Sodium: 139 mmol/L (ref 134–144)
Total Protein: 6.6 g/dL (ref 6.0–8.5)
eGFR: 78 mL/min/{1.73_m2} (ref >59)

## 2022-08-14 MED ORDER — SEMAGLUTIDE (1 MG/DOSE) 4 MG/3ML ~~LOC~~ SOPN: 1.0000 mg | PEN_INJECTOR | SUBCUTANEOUS | 2 refills | Status: DC

## 2022-08-14 NOTE — Patient Instructions (Signed)

## 2022-08-14 NOTE — Progress Notes (Signed)
Subjective:    Patient ID: Peggy Hall, female    DOB: [birth year], 62 y.o.   MRN: [id number]  Chief Complaint  Patient presents with   Diabetes   gad   Pt presents to the office today to recheck DM. She was seen on 06/13/22 and found to have A1C of 10.8. We increased her Trulicity to 1.5 mg weekly. Her glucose has  improved.   States her insurance is no longer paying for Trulicity 1.5 mg and rx is over $600 a month.  Diabetes She presents for her follow-up diabetic visit. She has type 2 diabetes mellitus. Pertinent negatives for diabetes include no blurred vision, no chest pain and no foot paresthesias. Symptoms are stable. Risk factors for coronary artery disease include dyslipidemia, diabetes mellitus, hypertension and sedentary lifestyle. She is following a generally unhealthy diet. Her overall blood glucose range is 140-180 mg/dl.  Hypertension This is a chronic problem. The current episode started more than 1 year ago. The problem has been resolved since onset. The problem is controlled. Associated symptoms include malaise/fatigue. Pertinent negatives include no blurred vision, chest pain, peripheral edema or shortness of breath. The current treatment provides moderate improvement.  Hyperlipidemia This is a chronic problem. The current episode started more than 1 year ago. The problem is controlled. Recent lipid tests were reviewed and are normal. Pertinent negatives include no chest pain or shortness of breath. Current antihyperlipidemic treatment includes statins. The current treatment provides moderate improvement of lipids. Risk factors for coronary artery disease include diabetes mellitus, hypertension, a sedentary lifestyle and post-menopausal.      Review of Systems  Constitutional:  Positive for malaise/fatigue.  Eyes:  Negative for blurred vision.  Respiratory:  Negative for shortness of breath.   Cardiovascular:  Negative for chest pain.  All other systems reviewed and are  negative.      Objective:   Physical Exam Vitals reviewed.  Constitutional:      General: She is not in acute distress.    Appearance: She is well-developed.  HENT:     Head: Normocephalic and atraumatic.     Right Ear: Tympanic membrane normal.     Left Ear: Tympanic membrane normal.  Eyes:     Pupils: Pupils are equal, round, and reactive to light.  Neck:     Thyroid: No thyromegaly.  Cardiovascular:     Rate and Rhythm: Normal rate and regular rhythm.     Heart sounds: Normal heart sounds. No murmur heard. Pulmonary:     Effort: Pulmonary effort is normal. No respiratory distress.     Breath sounds: Normal breath sounds. No wheezing.  Abdominal:     General: Bowel sounds are normal. There is no distension.     Palpations: Abdomen is soft.     Tenderness: There is no abdominal tenderness.  Musculoskeletal:        General: No tenderness. Normal range of motion.     Cervical back: Normal range of motion and neck supple.  Skin:    General: Skin is warm and dry.  Neurological:     Mental Status: She is alert and oriented to person, place, and time.     Cranial Nerves: No cranial nerve deficit.     Deep Tendon Reflexes: Reflexes are normal and symmetric.  Psychiatric:        Behavior: Behavior normal.        Thought Content: Thought content normal.        Judgment: Judgment normal.  BP 139/80   Pulse 82   Temp 97.6 F (36.4 C) (Temporal)   Ht 5' 4"$  (1.626 m)   Wt 158 lb (71.7 kg)   SpO2 100%   BMI 27.12 kg/m      Assessment & Plan:  Peggy Hall comes in today with chief complaint of Diabetes and gad   Diagnosis and orders addressed:  1. Essential hypertension - Amb Referral to Clinical Pharmacist - CMP14+EGFR  2. Type 2 diabetes mellitus with other specified complication, without long-term current use of insulin (HCC) Stop Trulicity and start Ozempic 1 mg  Low carb diet - Semaglutide, 1 MG/DOSE, 4 MG/3ML SOPN; Inject 1 mg as directed once a  week.  Dispense: 3 mL; Refill: 2 - Amb Referral to Clinical Pharmacist - [id number]+EGFR  3. Hyperlipidemia associated with type 2 diabetes mellitus ([place]) - Amb Referral to Clinical Pharmacist - [id number]+EGFR  4. GAD (generalized anxiety disorder) - Amb Referral to Clinical Pharmacist - [place] pending Health Maintenance reviewed Diet and exercise encouraged  Follow up plan: 2 months and make appointment with Clinical Pharm to help with medications coverage   Evelina Dun, FNP

## 2022-08-24 ENCOUNTER — Other Ambulatory Visit: Payer: Self-pay | Admitting: Family

## 2022-08-24 DIAGNOSIS — E1129 Type 2 diabetes mellitus with other diabetic kidney complication: Secondary | ICD-10-CM

## 2022-08-27 ENCOUNTER — Telehealth: Payer: Self-pay | Admitting: Family

## 2022-08-27 NOTE — Telephone Encounter (Signed)
Pt called to let PCP know that she is having a hard time getting her Trulicity filled at the pharmacy. Says she is on a waiting list. Was told by the pharmacy that if PCP wants to send in Rx for 0.75, they do have that one in stock.  Please advise and call patient. She has been without her Trulicity for almost 5 weeks and sugar has been running between 280-380.

## 2022-08-28 MED ORDER — TRULICITY 0.75 MG/0.5ML ~~LOC~~ SOAJ: 0.7500 mg | SUBCUTANEOUS | 1 refills | Status: DC

## 2022-08-28 NOTE — Telephone Encounter (Signed)
Prescription sent to pharmacy.

## 2022-08-28 NOTE — Telephone Encounter (Signed)
Patient states you asked her if she wanted to try the ozempic and she said yea but when she tried to get it it it cost more. So she went back to trulicity and wants the .75 called in.

## 2022-08-28 NOTE — Telephone Encounter (Signed)
Is she taking Trulicity or Ozempic? Ozempic is on her current list.

## 2022-08-28 NOTE — Addendum Note (Signed)
Addended by: Evelina Dun A on: 08/28/2022 03:14 PM   Modules accepted: Orders

## 2022-09-01 ENCOUNTER — Other Ambulatory Visit: Payer: Self-pay | Admitting: Family

## 2022-09-01 DIAGNOSIS — E1169 Type 2 diabetes mellitus with other specified complication: Secondary | ICD-10-CM

## 2022-09-02 ENCOUNTER — Telehealth: Payer: Self-pay | Admitting: Family

## 2022-09-03 ENCOUNTER — Telehealth: Payer: Self-pay | Admitting: Family

## 2022-09-03 MED ORDER — TOUJEO MAX SOLOSTAR 300 UNIT/ML ~~LOC~~ SOPN: 8.0000 [IU] | PEN_INJECTOR | Freq: Every day | SUBCUTANEOUS | 2 refills | Status: DC

## 2022-09-03 NOTE — Telephone Encounter (Signed)
I have sen in Toujeo 8 units of  insulin nightly. Need to be on strict low carb diet.

## 2022-09-03 NOTE — Telephone Encounter (Signed)
Patient calling to let us know the Toujeo that was prescribed was just as expensive as the Trulicity and she cannot afford them.  She does have an appointment tomorrow with Almyra Free to discuss options and patient assistance.

## 2022-09-03 NOTE — Telephone Encounter (Signed)
No answer, mailbox full

## 2022-09-03 NOTE — Telephone Encounter (Signed)
Patient call was transferred to me last night. Patient is waiting for co pay card hack to go through for her Trulicity. Patient states her sugars are going from 200-400. Patient aware if her sugars go to 400 again and can't get down she need to go to the ER. Patient has not been watching what she eats so has been craving carb's and sugar and had been eating and drinking a lot. Patient was advised she needed to have a strict low carb diet. Has appointment Thursday with Almyra Free. Patient is aware to check her BS tid and keep a log for [place]. Patient has not had trulicity since January. What can she do since she can not get her Trulicity? Please advise.

## 2022-09-03 NOTE — Telephone Encounter (Signed)
Spoke with patient Her Bgs are down in the 200s She is without medications due to insurance Bumped her appt up to early AM tomorrow with PharmD Will assist with samples and coapy cards Patient has commercial insurance therefore she is not eligible for med assistance

## 2022-09-04 ENCOUNTER — Other Ambulatory Visit: Payer: No Typology Code available for payment source

## 2022-09-04 ENCOUNTER — Ambulatory Visit: Payer: No Typology Code available for payment source

## 2022-09-04 DIAGNOSIS — E1169 Type 2 diabetes mellitus with other specified complication: Secondary | ICD-10-CM

## 2022-09-04 DIAGNOSIS — Z7985 Long-term (current) use of injectable non-insulin antidiabetic drugs: Secondary | ICD-10-CM | POA: Diagnosis not present

## 2022-09-04 NOTE — Progress Notes (Signed)
       09/04/2022 Name: Peggy Hall    MRN: [id number]       DOB: 1960-09-09     S:  62 yoF Presents for diabetes evaluation, education, and management. Patient was having difficulty with insurance and was unable to get trulicity and other medications.  Her insurance will now start working on 3/15.  He blood sugars have been affected.   Insurance coverage/medication affordability: BCBS   Patient reports adherence with medications. Current diabetes medications include: XIGDUO Current hypertension medications include: lisinopril Goal 130/80 Current hyperlipidemia medications include: atorvastatin LDL 62 12/19/19   Patient denies hypoglycemic events.   Patient reported dietary habits: Eats 3 meals/day   The patient is asked to make an attempt to improve diet and exercise patterns to aid in medical management of this problem.   Patient-reported exercise habits: walks/does incorporate activity   O: A1c 10.8% on 05/2022   Lipid Panel     Component Value Date/Time   CHOL 135 03/20/2022 0917   TRIG 157 (H) 03/20/2022 0917   HDL 40 03/20/2022 0917   CHOLHDL 3.4 03/20/2022 0917   LDLCALC 68 03/20/2022 0917   LDLDIRECT 122 (H) 11/07/2016 1351   LABVLDL 27 03/20/2022 0917     Home fasting blood sugars: 200s   2 hour post-meal/random blood sugars: 200-300   Clinical Atherosclerotic Cardiovascular Disease (ASCVD): No    The 10-year ASCVD risk score (Arnett DK, et al., 2019) is: 12.1%   Values used to calculate the score:     Age: 62 years     Sex: Female     Is Non-Hispanic African American: No     Diabetic: Yes     Tobacco smoker: No     Systolic Blood Pressure: 732 mmHg     Is BP treated: Yes     HDL Cholesterol: 33 mg/dL     Total Cholesterol: 142 mg/dL     A/P:   Diabetes T2DM currently UNCONTROLLED. Patient is adherent with medication.    -START Ozempic 0.25mg  sq weekly x4 weeks. Then increase to 0.5mg  weekly             Denies personal and family history of  Medullary thyroid cancer (MTC)   -Continue Xigduo XR 5/1000mg  (2 tabs qAM)  -Will hold on starting insulin at this time   -Discussed dietary restrictions for fatty liver that go along with diabetic diet/cholesterol diet             Handouts left up front for patient to pick up             Encouraged exercise, 5-10 lb weight loss per GI notes   -Extensively discussed pathophysiology of diabetes, recommended lifestyle interventions, dietary effects on blood sugar control   -Counseled on s/sx of and management of hypoglycemia   -Next A1C anticipated 6 months.   Written patient instructions provided.  Total time in counseling 24 minutes.        Regina Eck, PharmD, BCPS Clinical Pharmacist, [place]  II Phone [phone]

## 2022-09-04 NOTE — Telephone Encounter (Signed)
Patient seen by clinical pharmacist on 09/04/22, encounter closed.

## 2022-10-07 ENCOUNTER — Telehealth: Payer: Self-pay | Admitting: Family

## 2022-10-07 DIAGNOSIS — E1169 Type 2 diabetes mellitus with other specified complication: Secondary | ICD-10-CM

## 2022-10-10 NOTE — Telephone Encounter (Signed)
Referral sent to [facility].

## 2022-10-10 NOTE — Addendum Note (Signed)
Addended by: Austin Miles F on: 10/10/2022 01:04 PM   Modules accepted: Orders

## 2022-10-10 NOTE — Telephone Encounter (Signed)
Please make patient an appointment with Raynelle Fanning to discuss medication assistance

## 2022-10-10 NOTE — Telephone Encounter (Signed)
Referral put in.

## 2022-10-13 ENCOUNTER — Ambulatory Visit (INDEPENDENT_AMBULATORY_CARE_PROVIDER_SITE_OTHER): Payer: No Typology Code available for payment source | Admitting: Family

## 2022-10-13 VITALS — BP 133/74 | HR 79 | Temp 97.0°F | Ht 64.0 in | Wt 159.0 lb

## 2022-10-13 DIAGNOSIS — F411 Generalized anxiety disorder: Secondary | ICD-10-CM | POA: Diagnosis not present

## 2022-10-13 DIAGNOSIS — E785 Hyperlipidemia, unspecified: Secondary | ICD-10-CM

## 2022-10-13 DIAGNOSIS — Z86010 Personal history of colonic polyps: Secondary | ICD-10-CM

## 2022-10-13 DIAGNOSIS — F325 Major depressive disorder, single episode, in full remission: Secondary | ICD-10-CM | POA: Diagnosis not present

## 2022-10-13 DIAGNOSIS — G4700 Insomnia, unspecified: Secondary | ICD-10-CM

## 2022-10-13 DIAGNOSIS — I10 Essential (primary) hypertension: Secondary | ICD-10-CM

## 2022-10-13 DIAGNOSIS — E1169 Type 2 diabetes mellitus with other specified complication: Secondary | ICD-10-CM

## 2022-10-13 DIAGNOSIS — M797 Fibromyalgia: Secondary | ICD-10-CM | POA: Diagnosis not present

## 2022-10-13 DIAGNOSIS — Z79899 Other long term (current) drug therapy: Secondary | ICD-10-CM

## 2022-10-13 DIAGNOSIS — M159 Polyosteoarthritis, unspecified: Secondary | ICD-10-CM

## 2022-10-13 LAB — BAYER DCA HB A1C WAIVED: HB A1C (BAYER DCA - WAIVED): 9.2 % — ABNORMAL HIGH (ref 4.8–5.6)

## 2022-10-13 MED ORDER — SEMAGLUTIDE (1 MG/DOSE) 4 MG/3ML ~~LOC~~ SOPN
1.0000 mg | PEN_INJECTOR | SUBCUTANEOUS | 2 refills | Status: DC
Start: 2022-10-13 — End: 2022-10-14

## 2022-10-13 MED ORDER — TRAMADOL HCL 50 MG PO TABS
50.0000 mg | ORAL_TABLET | Freq: Three times a day (TID) | ORAL | 2 refills | Status: DC | PRN
Start: 2022-10-13 — End: 2022-10-13

## 2022-10-13 MED ORDER — TRAMADOL HCL 50 MG PO TABS
50.0000 mg | ORAL_TABLET | Freq: Three times a day (TID) | ORAL | 2 refills | Status: DC | PRN
Start: 2022-10-13 — End: 2023-01-13

## 2022-10-13 NOTE — Patient Instructions (Signed)

## 2022-10-13 NOTE — Progress Notes (Signed)
Subjective:    Patient ID: Peggy Hall, female    DOB: [birth year], 61 y.o.   MRN: [id number]  Chief Complaint  Patient presents with   Diabetes   Pt presents to the office today for chronic follow up. She is followed by Rheumatologists every 3 months for Fibromyalgia. .She continues to have bilateral shoulders, knee, and back pain that has improved.    She has hepatic steatosis and followed by GI.   Her diabetes is uncontrolled and  was seen on 06/13/22 and found to have A1C of 10.8. We started her on Ozempic, but is over $600 a month. Requesting rx be sent to [facility] pharmacy.  Diabetes She presents for her follow-up diabetic visit. She has type 2 diabetes mellitus. Hypoglycemia symptoms include nervousness/anxiousness. Pertinent negatives for diabetes include no blurred vision and no foot paresthesias. Symptoms are stable. Risk factors for coronary artery disease include diabetes mellitus, dyslipidemia, hypertension and sedentary lifestyle. Her overall blood glucose range is 140-180 mg/dl. Eye exam is current.  Hypertension This is a chronic problem. The current episode started more than 1 year ago. The problem has been resolved since onset. The problem is controlled. Associated symptoms include anxiety. Pertinent negatives include no blurred vision, malaise/fatigue, peripheral edema or shortness of breath. Risk factors for coronary artery disease include dyslipidemia, diabetes mellitus and obesity. The current treatment provides moderate improvement.  Hyperlipidemia This is a chronic problem. The current episode started more than 1 year ago. The problem is controlled. Exacerbating diseases include obesity. Pertinent negatives include no shortness of breath. Current antihyperlipidemic treatment includes statins. The current treatment provides moderate improvement of lipids. Risk factors for coronary artery disease include hypertension, dyslipidemia, diabetes mellitus, a sedentary lifestyle and  post-menopausal.  Arthritis Presents for follow-up visit. She complains of pain and stiffness. The symptoms have been stable. Affected locations include the left knee, right knee and right hip. Her pain is at a severity of 6/10.  Insomnia Primary symptoms: difficulty falling asleep, frequent awakening, no malaise/fatigue.   The current episode started more than one year. The onset quality is gradual. The problem occurs intermittently. PMH includes: depression.   Anxiety Presents for follow-up visit. Symptoms include excessive worry, insomnia, nervous/anxious behavior and restlessness. Patient reports no shortness of breath. Symptoms occur most days. The severity of symptoms is mild.    Depression        This is a chronic problem.  The current episode started more than 1 year ago.   The problem occurs intermittently.  Associated symptoms include insomnia and restlessness.  Associated symptoms include no helplessness, no hopelessness and not sad.  Past treatments include SNRIs - Serotonin and norepinephrine reuptake inhibitors.  Past medical history includes anxiety.    Current opioids rx- Ultram # meds rx- 60 Effectiveness of current meds-stable Adverse reactions from pain meds-none Morphine equivalent- 15  Pill count performed-No Last drug screen - 12/14/22 ( high risk q74m, moderate risk q50m, low risk yearly ) Urine drug screen today- No Was the NCCSR reviewed- yes  If yes were their any concerning findings? - none  Pain contract signed on: 12/25/21    Review of Systems  Constitutional:  Negative for malaise/fatigue.  Eyes:  Negative for blurred vision.  Respiratory:  Negative for shortness of breath.   Musculoskeletal:  Positive for arthritis and stiffness.  Psychiatric/Behavioral:  Positive for depression. The patient is nervous/anxious and has insomnia.   All other systems reviewed and are negative.      Objective:  Physical Exam Vitals reviewed.  Constitutional:       General: She is not in acute distress.    Appearance: She is well-developed.  HENT:     Head: Normocephalic and atraumatic.     Right Ear: Tympanic membrane normal.     Left Ear: Tympanic membrane normal.  Eyes:     Pupils: Pupils are equal, round, and reactive to light.  Neck:     Thyroid: No thyromegaly.  Cardiovascular:     Rate and Rhythm: Normal rate and regular rhythm.     Heart sounds: Normal heart sounds. No murmur heard. Pulmonary:     Effort: Pulmonary effort is normal. No respiratory distress.     Breath sounds: Normal breath sounds. No wheezing.  Abdominal:     General: Bowel sounds are normal. There is no distension.     Palpations: Abdomen is soft.     Tenderness: There is no abdominal tenderness.  Musculoskeletal:        General: No tenderness. Normal range of motion.     Cervical back: Normal range of motion and neck supple.  Skin:    General: Skin is warm and dry.  Neurological:     Mental Status: She is alert and oriented to person, place, and time.     Cranial Nerves: No cranial nerve deficit.     Deep Tendon Reflexes: Reflexes are normal and symmetric.  Psychiatric:        Behavior: Behavior normal.        Thought Content: Thought content normal.        Judgment: Judgment normal.       BP 133/74   Pulse 79   Temp (!) 97 F (36.1 C) (Temporal)   Ht  (1.626 m)   Wt 159 lb (72.1 kg)   SpO2 100%   BMI 27.29 kg/m      Assessment & Plan:  Peggy Hall comes in today with chief complaint of Diabetes   Diagnosis and orders addressed:  1. Major depressive disorder with single episode, in full remission - CMP14+EGFR  2. Essential hypertension - CMP14+EGFR  3. Fibromyalgia - CMP14+EGFR - traMADol (ULTRAM) 50 MG tablet; Take 1 tablet (50 mg total) by mouth every 8 (eight) hours as needed.  Dispense: 60 tablet; Refill: 2  4. GAD (generalized anxiety disorder) - CMP14+EGFR  5. H/O adenomatous polyp of colon - CMP14+EGFR  6.  Hyperlipidemia associated with type 2 diabetes mellitus - CMP14+EGFR  7. Insomnia, unspecified type - CMP14+EGFR  8. Osteoarthritis of multiple joints, unspecified osteoarthritis type - CMP14+EGFR - traMADol (ULTRAM) 50 MG tablet; Take 1 tablet (50 mg total) by mouth every 8 (eight) hours as needed.  Dispense: 60 tablet; Refill: 2  9. Type 2 diabetes mellitus with other specified complication, without long-term current use of insulin Will increase Ozempic to 1 mg from 0.5 mg  [facility] - Semaglutide, 1 MG/DOSE, 4 MG/3ML SOPN; Inject 1 mg into the skin once a week.  Dispense: 3 mL; Refill: 2 - Bayer DCA Hb A1c Waived - CMP14+EGFR  10. Controlled substance agreement signed - CMP14+EGFR - traMADol (ULTRAM) 50 MG tablet; Take 1 tablet (50 mg total) by mouth every 8 (eight) hours as needed.  Dispense: 60 tablet; Refill: 2   Labs pending Patient reviewed in [place] controlled database, no flags noted. Contract and drug screen are up to date.  Health Maintenance reviewed Diet and exercise encouraged  Follow up plan: 3 months    [facility]  Lenna Gilford, [place]

## 2022-10-14 ENCOUNTER — Other Ambulatory Visit: Payer: Self-pay

## 2022-10-14 ENCOUNTER — Telehealth: Payer: Self-pay

## 2022-10-14 ENCOUNTER — Other Ambulatory Visit: Payer: Self-pay | Admitting: Family

## 2022-10-14 ENCOUNTER — Telehealth: Payer: Self-pay | Admitting: *Deleted

## 2022-10-14 DIAGNOSIS — E875 Hyperkalemia: Secondary | ICD-10-CM

## 2022-10-14 DIAGNOSIS — E1169 Type 2 diabetes mellitus with other specified complication: Secondary | ICD-10-CM

## 2022-10-14 LAB — CMP14+EGFR
ALT: 45 IU/L — ABNORMAL HIGH (ref 0–32)
AST: 24 IU/L (ref 0–40)
Albumin/Globulin Ratio: 2.3 — ABNORMAL HIGH (ref 1.2–2.2)
Albumin: 4.5 g/dL (ref 3.8–4.9)
Alkaline Phosphatase: 93 IU/L (ref 44–121)
BUN/Creatinine Ratio: 21 (ref 12–28)
BUN: 17 mg/dL (ref 8–27)
Bilirubin Total: 0.2 mg/dL (ref 0.0–1.2)
CO2: 20 mmol/L (ref 20–29)
Calcium: 10.4 mg/dL — ABNORMAL HIGH (ref 8.7–10.3)
Chloride: 103 mmol/L (ref 96–106)
Creatinine, Ser: 0.82 mg/dL (ref 0.57–1.00)
Globulin, Total: 2 g/dL (ref 1.5–4.5)
Glucose: 212 mg/dL — ABNORMAL HIGH (ref 70–99)
Potassium: 5.7 mmol/L — ABNORMAL HIGH (ref 3.5–5.2)
Sodium: 141 mmol/L (ref 134–144)
Total Protein: 6.5 g/dL (ref 6.0–8.5)
eGFR: 82 mL/min/{1.73_m2} (ref >59)

## 2022-10-14 MED ORDER — SEMAGLUTIDE (1 MG/DOSE) 4 MG/3ML ~~LOC~~ SOPN
1.0000 mg | PEN_INJECTOR | SUBCUTANEOUS | 2 refills | Status: DC
Start: 2022-10-14 — End: 2023-01-13

## 2022-10-14 NOTE — Telephone Encounter (Signed)
TC from [facility] RE: Semaglutide, they are a compounding pharmacy and do not have the auto-injector  In note section of script it needs to say: Semaglutide 2.5 mg pyridoxine 10 mg/ml

## 2022-10-14 NOTE — Progress Notes (Signed)
  Care Coordination  Note  10/14/2022 Name: Peggy Hall MRN: [id number] DOB: Oct 15, 1960  Peggy Hall is a 62 y.o. year old female who is a primary care patient of Junie Spencer, FNP. I reached out to Gaye Pollack by phone today to offer care coordination services.      [name] was given information about Care Coordination services today including:  The Care Coordination services include support from the care team which includes your Nurse Coordinator, Clinical Social Worker, or Pharmacist.  The Care Coordination team is here to help remove barriers to the health concerns and goals most important to you. Care Coordination services are voluntary and the patient may decline or stop services at any time by request to their care team member.   Patient agreed to services and verbal consent obtained.   Follow up plan: Telephone appointment with care coordination team member scheduled for:10/30/2022   Penne Lash, RMA Care Guide [facility]  [place], [place] [address] Direct Dial: [phone] [email or web address]

## 2022-10-15 ENCOUNTER — Ambulatory Visit: Payer: No Typology Code available for payment source

## 2022-10-15 DIAGNOSIS — E875 Hyperkalemia: Secondary | ICD-10-CM

## 2022-10-15 LAB — BMP8+EGFR
BUN/Creatinine Ratio: 18 (ref 12–28)
BUN: 15 mg/dL (ref 8–27)
CO2: 23 mmol/L (ref 20–29)
Calcium: 9.6 mg/dL (ref 8.7–10.3)
Chloride: 104 mmol/L (ref 96–106)
Creatinine, Ser: 0.83 mg/dL (ref 0.57–1.00)
Glucose: 201 mg/dL — ABNORMAL HIGH (ref 70–99)
Potassium: 5.3 mmol/L — ABNORMAL HIGH (ref 3.5–5.2)
Sodium: 142 mmol/L (ref 134–144)
eGFR: 81 mL/min/{1.73_m2} (ref >59)

## 2022-10-16 ENCOUNTER — Other Ambulatory Visit: Payer: Self-pay | Admitting: Family

## 2022-10-30 ENCOUNTER — Ambulatory Visit: Payer: No Typology Code available for payment source | Admitting: Pharmacist

## 2022-10-30 VITALS — BP 130/80 | HR 65

## 2022-10-30 DIAGNOSIS — E1169 Type 2 diabetes mellitus with other specified complication: Secondary | ICD-10-CM | POA: Diagnosis not present

## 2022-10-30 DIAGNOSIS — Z7984 Long term (current) use of oral hypoglycemic drugs: Secondary | ICD-10-CM | POA: Diagnosis not present

## 2022-10-30 NOTE — Progress Notes (Addendum)
10/30/2022 Name: Peggy Hall    MRN: [id number]       DOB: Jan 14, 1961     S:  61 yoF Presents for diabetes evaluation, education, and management. Patient was having difficulty with insurance and was unable to get trulicity and other medications.  Her insurance has started, however her RX coverage is only a discount card.  He blood sugars have been affected.   Insurance coverage/medication affordability: BCBS   Patient reports adherence with medications. Current diabetes medications include: XIGDUO, semaglutide coumpounded Current hypertension medications include: lisinopril Goal 130/80 Current hyperlipidemia medications include: atorvastatin LDL 62 12/19/19   Patient denies hypoglycemic events.   Patient reported dietary habits: Eats 3 meals/day   The patient is asked to make an attempt to improve diet and exercise patterns to aid in medical management of this problem.   Patient-reported exercise habits: walks/does incorporate activity   O: A1c 9.2% on 10/13/22   Lipid Panel  Labs (Brief)          Component Value Date/Time    CHOL 135 03/20/2022 0917    TRIG 157 (H) 03/20/2022 0917    HDL 40 03/20/2022 0917    CHOLHDL 3.4 03/20/2022 0917    LDLCALC 68 03/20/2022 0917    LDLDIRECT 122 (H) 11/07/2016 1351    LABVLDL 27 03/20/2022 0917        Home fasting blood sugars: 200s   2 hour post-meal/random blood sugars: 200-300   Clinical Atherosclerotic Cardiovascular Disease (ASCVD): No    The 10-year ASCVD risk score (Arnett DK, et al., 2019) is: 12.1%   Values used to calculate the score:     Age: 70 years     Sex: Female     Is Non-Hispanic African American: No     Diabetic: Yes     Tobacco smoker: No     Systolic Blood Pressure: 156 mmHg     Is BP treated: Yes     HDL Cholesterol: 33 mg/dL     Total Cholesterol: 142 mg/dL     A/P:   Diabetes N8GN currently UNCONTROLLED. Patient is adherent with medication. We are limited by insurance plan/only  prescription "discount" coverage   -Continue semaglutide 1mg  weekly (she is [facility] for $200/month)             Denies personal and family history of Medullary thyroid cancer (MTC)  Needs higher dose as sugars are rising, however cost will double if we increase her to 2mg  at this pharmacy per tech I spoke to today(cost is much lower than RX Ozempic, but still costly)  Provided patient with 2 months of trulicity samples to aid her in cost/outcomes (she was instructed to take the next week when she runs out of semaglutide compound)   -Continue Xigduo XR 5/1000mg  (2 tabs qAM)  Instructed patient to call me once she determines cost of Xigduo  If too expensive, we will send in plain metformin   -Will hold on starting insulin at this time   -Discussed dietary restrictions for fatty liver that go along with diabetic diet/cholesterol diet                         -Extensively discussed pathophysiology of diabetes, recommended lifestyle interventions, dietary effects on blood sugar control   -Counseled on s/sx of and management of hypoglycemia   -Next A1C anticipated 3 months.   Written patient  instructions provided.  Total time in counseling 20 minutes.        Kieth Brightly, PharmD, BCACP Clinical Pharmacist, [facility]  II Phone [phone]

## 2022-11-10 NOTE — Telephone Encounter (Signed)
Erroneous encounter will close.

## 2022-11-24 ENCOUNTER — Telehealth: Payer: Self-pay | Admitting: Family

## 2022-11-28 MED ORDER — DAPAGLIFLOZIN PROPANEDIOL 10 MG PO TABS: 10.0000 mg | ORAL_TABLET | Freq: Every day | ORAL | 1 refills | Status: DC

## 2022-11-28 MED ORDER — METFORMIN HCL 1000 MG PO TABS: 1000.0000 mg | ORAL_TABLET | Freq: Two times a day (BID) | ORAL | 3 refills | Status: DC

## 2022-11-28 NOTE — Telephone Encounter (Signed)
Marcelline Deist and Metformin separate prescriptions sent to pharmacy.

## 2022-11-28 NOTE — Telephone Encounter (Signed)
Patient aware and verbalized understanding. °

## 2022-11-29 ENCOUNTER — Other Ambulatory Visit: Payer: Self-pay | Admitting: Family

## 2022-11-29 DIAGNOSIS — E1169 Type 2 diabetes mellitus with other specified complication: Secondary | ICD-10-CM

## 2022-12-31 ENCOUNTER — Other Ambulatory Visit: Payer: Self-pay | Admitting: Family

## 2022-12-31 DIAGNOSIS — F411 Generalized anxiety disorder: Secondary | ICD-10-CM

## 2022-12-31 DIAGNOSIS — F32A Depression, unspecified: Secondary | ICD-10-CM

## 2022-12-31 DIAGNOSIS — G4700 Insomnia, unspecified: Secondary | ICD-10-CM

## 2023-01-01 ENCOUNTER — Telehealth: Payer: Self-pay | Admitting: Family

## 2023-01-02 NOTE — Telephone Encounter (Signed)
I'm not familiar with this company and did not know this was coming Please have them send again for Iness I saw [employer] and faxed it back Please have company put "attn Raynelle Fanning" These forms can get lost in the shuffle

## 2023-01-02 NOTE — Telephone Encounter (Signed)
They are aware to fax back

## 2023-01-13 ENCOUNTER — Ambulatory Visit: Payer: No Typology Code available for payment source | Admitting: Family

## 2023-01-13 ENCOUNTER — Encounter: Payer: Self-pay | Admitting: Family

## 2023-01-13 VITALS — BP 137/86 | HR 80 | Temp 97.5°F | Ht 64.0 in | Wt 150.2 lb

## 2023-01-13 DIAGNOSIS — G4700 Insomnia, unspecified: Secondary | ICD-10-CM

## 2023-01-13 DIAGNOSIS — E785 Hyperlipidemia, unspecified: Secondary | ICD-10-CM

## 2023-01-13 DIAGNOSIS — M159 Polyosteoarthritis, unspecified: Secondary | ICD-10-CM

## 2023-01-13 DIAGNOSIS — F325 Major depressive disorder, single episode, in full remission: Secondary | ICD-10-CM

## 2023-01-13 DIAGNOSIS — I10 Essential (primary) hypertension: Secondary | ICD-10-CM | POA: Diagnosis not present

## 2023-01-13 DIAGNOSIS — E1169 Type 2 diabetes mellitus with other specified complication: Secondary | ICD-10-CM

## 2023-01-13 DIAGNOSIS — F411 Generalized anxiety disorder: Secondary | ICD-10-CM

## 2023-01-13 DIAGNOSIS — K760 Fatty (change of) liver, not elsewhere classified: Secondary | ICD-10-CM

## 2023-01-13 DIAGNOSIS — M797 Fibromyalgia: Secondary | ICD-10-CM

## 2023-01-13 DIAGNOSIS — Z7985 Long-term (current) use of injectable non-insulin antidiabetic drugs: Secondary | ICD-10-CM

## 2023-01-13 DIAGNOSIS — Z79899 Other long term (current) drug therapy: Secondary | ICD-10-CM

## 2023-01-13 LAB — BAYER DCA HB A1C WAIVED: HB A1C (BAYER DCA - WAIVED): 6.8 % — ABNORMAL HIGH (ref 4.8–5.6)

## 2023-01-13 MED ORDER — TRAMADOL HCL 50 MG PO TABS
50.0000 mg | ORAL_TABLET | Freq: Three times a day (TID) | ORAL | 2 refills | Status: DC | PRN
Start: 2023-01-13 — End: 2023-04-16

## 2023-01-13 MED ORDER — SEMAGLUTIDE (1 MG/DOSE) 4 MG/3ML ~~LOC~~ SOPN
1.0000 mg | PEN_INJECTOR | SUBCUTANEOUS | 1 refills | Status: DC
Start: 2023-01-13 — End: 2023-04-16

## 2023-01-13 NOTE — Progress Notes (Signed)
Subjective:    Patient ID: Peggy Hall, female    DOB: [birth year], 62 y.o.   MRN: [id number]  Chief Complaint  Patient presents with   Medical Management of Chronic Issues   Pt presents to the office today for chronic follow up. She is followed by Rheumatologists every 3 months for Fibromyalgia. .She continues to have bilateral shoulders, knee, and back pain that has improved.    She has hepatic steatosis and followed by GI as needed.    Her diabetes is uncontrolled and her A1C of 9.2. We started her on Ozempic, but is over $600 a month. She was able to get samples of Trulicity. She also got new insurance and wants a new rx of Ozempic sent to pharmacy to see if it is covered.   Hypertension This is a chronic problem. The current episode started more than 1 year ago. The problem has been resolved since onset. Associated symptoms include anxiety and malaise/fatigue. Pertinent negatives include no blurred vision, peripheral edema or shortness of breath. Risk factors for coronary artery disease include dyslipidemia and sedentary lifestyle. The current treatment provides moderate improvement.  Diabetes She presents for her follow-up diabetic visit. She has type 2 diabetes mellitus. Hypoglycemia symptoms include nervousness/anxiousness. Associated symptoms include fatigue. Pertinent negatives for diabetes include no blurred vision and no foot paresthesias. Symptoms are stable. Risk factors for coronary artery disease include diabetes mellitus, dyslipidemia, hypertension, sedentary lifestyle and post-menopausal. She is following a generally unhealthy diet. Her overall blood glucose range is 140-180 mg/dl. Eye exam is current.  Hyperlipidemia This is a chronic problem. The current episode started more than 1 year ago. The problem is controlled. Recent lipid tests were reviewed and are normal. Pertinent negatives include no shortness of breath. Current antihyperlipidemic treatment includes statins. The  current treatment provides moderate improvement of lipids. Risk factors for coronary artery disease include dyslipidemia, diabetes mellitus, hypertension, a sedentary lifestyle and post-menopausal.  Arthritis Presents for follow-up visit. She complains of pain and stiffness. Affected locations include the right knee, left knee, right shoulder and left shoulder (k). Her pain is at a severity of 7/10. Associated symptoms include fatigue.  Insomnia Primary symptoms: fragmented sleep, difficulty falling asleep, malaise/fatigue.   The current episode started more than one year. The onset quality is gradual. The problem occurs intermittently. Past treatments include medication. The treatment provided moderate relief. PMH includes: depression.   Anxiety Presents for follow-up visit. Symptoms include depressed mood, excessive worry, insomnia and nervous/anxious behavior. Patient reports no shortness of breath. Symptoms occur occasionally. The severity of symptoms is moderate.    Depression        This is a chronic problem.  The current episode started more than 1 year ago.   The problem occurs intermittently.  Associated symptoms include fatigue and insomnia.  Associated symptoms include no helplessness, no hopelessness and not sad.  Past treatments include SNRIs - Serotonin and norepinephrine reuptake inhibitors.  Past medical history includes anxiety.    Current opioids rx- Ultram # meds rx- 60 Effectiveness of current meds-stable Adverse reactions from pain meds-none Morphine equivalent- 15   Pill count performed-No Last drug screen - 01/13/23 ( high risk q40m, moderate risk q63m, low risk yearly ) Urine drug screen today- No Was the NCCSR reviewed- yes             If yes were their any concerning findings? - none   Pain contract signed on: 01/13/23   Review of Systems  Constitutional:  Positive  for fatigue and malaise/fatigue.  Eyes:  Negative for blurred vision.  Respiratory:  Negative for  shortness of breath.   Musculoskeletal:  Positive for arthritis and stiffness.  Psychiatric/Behavioral:  Positive for depression. The patient is nervous/anxious and has insomnia.   All other systems reviewed and are negative.      Objective:   Physical Exam Vitals reviewed.  Constitutional:      General: She is not in acute distress.    Appearance: She is well-developed.  HENT:     Head: Normocephalic and atraumatic.     Right Ear: Tympanic membrane normal.     Left Ear: Tympanic membrane normal.  Eyes:     Pupils: Pupils are equal, round, and reactive to light.  Neck:     Thyroid: No thyromegaly.  Cardiovascular:     Rate and Rhythm: Normal rate and regular rhythm.     Heart sounds: Normal heart sounds. No murmur heard. Pulmonary:     Effort: Pulmonary effort is normal. No respiratory distress.     Breath sounds: Normal breath sounds. No wheezing.  Abdominal:     General: Bowel sounds are normal. There is no distension.     Palpations: Abdomen is soft.     Tenderness: There is no abdominal tenderness.  Musculoskeletal:        General: No tenderness. Normal range of motion.     Cervical back: Normal range of motion and neck supple.  Skin:    General: Skin is warm and dry.  Neurological:     Mental Status: She is alert and oriented to person, place, and time.     Cranial Nerves: No cranial nerve deficit.     Deep Tendon Reflexes: Reflexes are normal and symmetric.  Psychiatric:        Behavior: Behavior normal.        Thought Content: Thought content normal.        Judgment: Judgment normal.      BP 137/86   Pulse 80   Temp (!) 97.5 F (36.4 C)   Ht 5\' 4"  (1.626 m)   Wt 150 lb 3.2 oz (68.1 kg)   SpO2 97%   BMI 25.78 kg/m      Assessment & Plan:  Patches Mcdonnell comes in today with chief complaint of Medical Management of Chronic Issues   Diagnosis and orders addressed:  1. Essential hypertension - CMP14+EGFR  2. Type 2 diabetes mellitus with other  specified complication, without long-term current use of insulin (HCC) Will start Ozempic 1 mg, she has been taking Trulicity 1.5 mg weekly Low carb diet  - Bayer DCA Hb A1c Waived - CMP14+EGFR - Semaglutide, 1 MG/DOSE, 4 MG/3ML SOPN; Inject 1 mg into the skin once a week.  Dispense: 3 mL; Refill: 1  3. Osteoarthritis of multiple joints, unspecified osteoarthritis type  - ToxASSURE Select 13 (MW), Urine - CMP14+EGFR - traMADol (ULTRAM) 50 MG tablet; Take 1 tablet (50 mg total) by mouth every 8 (eight) hours as needed.  Dispense: 60 tablet; Refill: 2  4. Insomnia, unspecified type  - CMP14+EGFR  5. Hepatic steatosis - CMP14+EGFR  6. GAD (generalized anxiety disorder) - CMP14+EGFR  7. Hyperlipidemia, unspecified hyperlipidemia type - CMP14+EGFR  8. Hyperlipidemia associated with type 2 diabetes mellitus (HCC) - CMP14+EGFR  9. Major depressive disorder with single episode, in full remission (HCC) - CMP14+EGFR  10. Fibromyalgia - CMP14+EGFR - traMADol (ULTRAM) 50 MG tablet; Take 1 tablet (50 mg total) by mouth every 8 (eight) hours  as needed.  Dispense: 60 tablet; Refill: 2  11. Controlled substance agreement signed - ToxASSURE Select 13 (MW), Urine - CMP14+EGFR - traMADol (ULTRAM) 50 MG tablet; Take 1 tablet (50 mg total) by mouth every 8 (eight) hours as needed.  Dispense: 60 tablet; Refill: 2   Labs pending Continue current meds- low fat diet and exercise and recheck in 3 months Ozempic 1 mg started today- Pt has been taking Trulicity 1.5 mg samples.  Health Maintenance reviewed Diet and exercise encouraged  Follow up plan: 3 months    Jannifer Rodney, FNP

## 2023-01-13 NOTE — Patient Instructions (Signed)

## 2023-01-14 LAB — CMP14+EGFR
ALT: 35 IU/L — ABNORMAL HIGH (ref 0–32)
AST: 25 IU/L (ref 0–40)
Albumin: 4.6 g/dL (ref 3.8–4.9)
Alkaline Phosphatase: 88 IU/L (ref 44–121)
BUN/Creatinine Ratio: 25 (ref 12–28)
BUN: 18 mg/dL (ref 8–27)
Bilirubin Total: 0.4 mg/dL (ref 0.0–1.2)
CO2: 24 mmol/L (ref 20–29)
Calcium: 9.9 mg/dL (ref 8.7–10.3)
Chloride: 101 mmol/L (ref 96–106)
Creatinine, Ser: 0.72 mg/dL (ref 0.57–1.00)
Globulin, Total: 2.2 g/dL (ref 1.5–4.5)
Glucose: 126 mg/dL — ABNORMAL HIGH (ref 70–99)
Potassium: 5.3 mmol/L — ABNORMAL HIGH (ref 3.5–5.2)
Sodium: 139 mmol/L (ref 134–144)
Total Protein: 6.8 g/dL (ref 6.0–8.5)
eGFR: 96 mL/min/{1.73_m2} (ref >59)

## 2023-01-16 ENCOUNTER — Telehealth: Payer: Self-pay

## 2023-01-16 NOTE — Telephone Encounter (Signed)
Rec'd approval letter from [employer] (possibly for [place]).  Patient enrolled 01/08/23-01/08/24.  Med will ship to patients home.

## 2023-01-20 ENCOUNTER — Other Ambulatory Visit: Payer: Self-pay | Admitting: *Deleted

## 2023-01-20 MED ORDER — TRAZODONE HCL 100 MG PO TABS: 100.0000 mg | ORAL_TABLET | Freq: Every day | ORAL | 1 refills | Status: DC

## 2023-01-20 NOTE — Telephone Encounter (Signed)
Fax from [facility] Remaining refills of Trazodone sent to them

## 2023-01-27 ENCOUNTER — Telehealth: Payer: Self-pay | Admitting: Pharmacist

## 2023-01-27 NOTE — Telephone Encounter (Signed)
Ozempic 1mg  #4 boxed delivered to PCP office for patient  Patient notified Reports Bgs are within range Continue current therapy

## 2023-02-20 ENCOUNTER — Telehealth: Payer: Self-pay | Admitting: Family

## 2023-02-20 ENCOUNTER — Other Ambulatory Visit: Payer: Self-pay

## 2023-02-20 DIAGNOSIS — F32A Depression, unspecified: Secondary | ICD-10-CM

## 2023-02-20 DIAGNOSIS — G4700 Insomnia, unspecified: Secondary | ICD-10-CM

## 2023-02-20 DIAGNOSIS — F411 Generalized anxiety disorder: Secondary | ICD-10-CM

## 2023-02-20 MED ORDER — DESVENLAFAXINE SUCCINATE ER 100 MG PO TB24
100.0000 mg | ORAL_TABLET | Freq: Every day | ORAL | 0 refills | Status: DC
Start: 2023-02-20 — End: 2023-04-16

## 2023-02-20 NOTE — Telephone Encounter (Signed)
I have not seen these.   Jannifer Rodney, FNP

## 2023-02-20 NOTE — Telephone Encounter (Signed)
Left message to call back  

## 2023-02-20 NOTE — Telephone Encounter (Signed)
Peggy Hall,  Have you seen these forms?

## 2023-03-09 ENCOUNTER — Other Ambulatory Visit: Payer: Self-pay | Admitting: Family

## 2023-03-09 DIAGNOSIS — E1169 Type 2 diabetes mellitus with other specified complication: Secondary | ICD-10-CM

## 2023-04-10 NOTE — Patient Instructions (Signed)
Our records indicate that you are due for your screening mammogram.  Please call the imaging center that does your yearly mammograms to make an appointment for a mammogram at your earliest convenience. Our office also has a mobile unit through the [facility] that comes to our location. Please call our office if you would like to make an appointment.   

## 2023-04-16 ENCOUNTER — Ambulatory Visit: Payer: Self-pay | Admitting: Family

## 2023-04-16 ENCOUNTER — Encounter: Payer: Self-pay | Admitting: Family

## 2023-04-16 VITALS — BP 138/86 | HR 83 | Temp 97.5°F | Resp 18 | Ht 64.0 in | Wt 151.8 lb

## 2023-04-16 DIAGNOSIS — M797 Fibromyalgia: Secondary | ICD-10-CM

## 2023-04-16 DIAGNOSIS — E1169 Type 2 diabetes mellitus with other specified complication: Secondary | ICD-10-CM | POA: Diagnosis not present

## 2023-04-16 DIAGNOSIS — Z23 Encounter for immunization: Secondary | ICD-10-CM | POA: Diagnosis not present

## 2023-04-16 DIAGNOSIS — F32A Depression, unspecified: Secondary | ICD-10-CM

## 2023-04-16 DIAGNOSIS — E785 Hyperlipidemia, unspecified: Secondary | ICD-10-CM

## 2023-04-16 DIAGNOSIS — F411 Generalized anxiety disorder: Secondary | ICD-10-CM

## 2023-04-16 DIAGNOSIS — Z0000 Encounter for general adult medical examination without abnormal findings: Secondary | ICD-10-CM

## 2023-04-16 DIAGNOSIS — G4700 Insomnia, unspecified: Secondary | ICD-10-CM

## 2023-04-16 DIAGNOSIS — Z0001 Encounter for general adult medical examination with abnormal findings: Secondary | ICD-10-CM | POA: Diagnosis not present

## 2023-04-16 DIAGNOSIS — R110 Nausea: Secondary | ICD-10-CM

## 2023-04-16 DIAGNOSIS — M159 Polyosteoarthritis, unspecified: Secondary | ICD-10-CM

## 2023-04-16 DIAGNOSIS — I10 Essential (primary) hypertension: Secondary | ICD-10-CM

## 2023-04-16 DIAGNOSIS — Z79899 Other long term (current) drug therapy: Secondary | ICD-10-CM

## 2023-04-16 LAB — BAYER DCA HB A1C WAIVED: HB A1C (BAYER DCA - WAIVED): 6.9 % — ABNORMAL HIGH (ref 4.8–5.6)

## 2023-04-16 MED ORDER — DESVENLAFAXINE SUCCINATE ER 100 MG PO TB24
100.0000 mg | ORAL_TABLET | Freq: Every day | ORAL | 0 refills | Status: DC
Start: 2023-04-16 — End: 2023-07-20

## 2023-04-16 MED ORDER — TRAMADOL HCL 50 MG PO TABS: 50.0000 mg | ORAL_TABLET | Freq: Three times a day (TID) | ORAL | 2 refills | Status: DC | PRN

## 2023-04-16 MED ORDER — TRAZODONE HCL 100 MG PO TABS: 100.0000 mg | ORAL_TABLET | Freq: Every day | ORAL | 1 refills | Status: DC

## 2023-04-16 MED ORDER — ATORVASTATIN CALCIUM 10 MG PO TABS: 10.0000 mg | ORAL_TABLET | Freq: Every day | ORAL | 1 refills | Status: DC

## 2023-04-16 MED ORDER — PROPRANOLOL HCL 20 MG PO TABS
20.0000 mg | ORAL_TABLET | Freq: Two times a day (BID) | ORAL | 1 refills | Status: DC
Start: 2023-04-16 — End: 2023-07-20

## 2023-04-16 MED ORDER — SEMAGLUTIDE (1 MG/DOSE) 4 MG/3ML ~~LOC~~ SOPN: 1.0000 mg | PEN_INJECTOR | SUBCUTANEOUS | 1 refills | Status: DC

## 2023-04-16 MED ORDER — ONDANSETRON HCL 4 MG PO TABS: 4.0000 mg | ORAL_TABLET | Freq: Three times a day (TID) | ORAL | 2 refills | Status: DC | PRN

## 2023-04-16 NOTE — Progress Notes (Addendum)
Subjective:    Patient ID: Peggy Hall, female    DOB: 01/08/1961, 62 y.o.   MRN: [id number]  Chief Complaint  Patient presents with   Medical Management of Chronic Issues   Pt presents to the office today for  CPE and chronic follow up. She is followed by Rheumatologists every 3 months for Fibromyalgia. She continues to have bilateral shoulders, knee, and back pain that has improved.    She has hepatic steatosis and followed by GI as needed.    Hypertension This is a chronic problem. The current episode started more than 1 year ago. The problem has been resolved since onset. The problem is controlled. Associated symptoms include anxiety and malaise/fatigue. Pertinent negatives include no blurred vision, peripheral edema or shortness of breath. Risk factors for coronary artery disease include dyslipidemia, diabetes mellitus, obesity and sedentary lifestyle. The current treatment provides moderate improvement.  Diabetes She presents for her follow-up diabetic visit. She has type 2 diabetes mellitus. Hypoglycemia symptoms include nervousness/anxiousness. Pertinent negatives for diabetes include no blurred vision and no foot paresthesias. Pertinent negatives for diabetic complications include no peripheral neuropathy. Risk factors for coronary artery disease include dyslipidemia, diabetes mellitus, hypertension, sedentary lifestyle and post-menopausal. Her overall blood glucose range is 140-180 mg/dl.  Hyperlipidemia This is a chronic problem. The current episode started more than 1 year ago. The problem is controlled. Pertinent negatives include no shortness of breath. Current antihyperlipidemic treatment includes statins. The current treatment provides moderate improvement of lipids. Risk factors for coronary artery disease include dyslipidemia, diabetes mellitus, hypertension, a sedentary lifestyle and post-menopausal.  Arthritis Presents for follow-up visit. She complains of pain and stiffness.  Affected locations include the left knee, right knee, left MCP, right MCP, right shoulder and left shoulder. Her pain is at a severity of 8/10.  Insomnia Primary symptoms: sleep disturbance, difficulty falling asleep, malaise/fatigue.   The current episode started more than one year. The onset quality is gradual. The problem occurs intermittently. Past treatments include medication. The treatment provided moderate relief. PMH includes: depression.   Anxiety Presents for follow-up visit. Symptoms include decreased concentration, excessive worry, insomnia and nervous/anxious behavior. Patient reports no shortness of breath. Symptoms occur occasionally. The severity of symptoms is moderate.    Depression        This is a chronic problem.  The current episode started more than 1 year ago.   Associated symptoms include decreased concentration, insomnia and sad.  Associated symptoms include no helplessness and no hopelessness.  Past treatments include SNRIs - Serotonin and norepinephrine reuptake inhibitors.  Past medical history includes anxiety.       Review of Systems  Constitutional:  Positive for malaise/fatigue.  Eyes:  Negative for blurred vision.  Respiratory:  Negative for shortness of breath.   Musculoskeletal:  Positive for arthritis and stiffness.  Psychiatric/Behavioral:  Positive for decreased concentration, depression and sleep disturbance. The patient is nervous/anxious and has insomnia.   All other systems reviewed and are negative.   Family History  Problem Relation Age of Onset   Stroke Mother    Heart disease Mother    Depression Mother    Dementia Mother    Stroke Father    Hypertension Father    Diabetes Father    Hyperlipidemia Father    Peripheral Artery Disease Father    Bipolar disorder Sister    Cancer Maternal Grandmother            Colon cancer Maternal Grandmother    Liver disease  Neg Hx    Social History   Socioeconomic History   Marital status:  Married    Spouse name: Not on file   Number of children: Not on file   Years of education: Not on file   Highest education level: 12th grade  Occupational History   Not on file  Tobacco Use   Smoking status: Former    Types: Cigarettes   Smokeless tobacco: Never  Vaping Use   Vaping status: Never Used  Substance and Sexual Activity   Alcohol use: No   Drug use: No   Sexual activity: Not on file  Other Topics Concern   Not on file  Social History Narrative   Not on file   Social Determinants of Health   Financial Resource Strain: Low Risk  (04/15/2023)   Overall Financial Resource Strain (CARDIA)    Difficulty of Paying Living Expenses: Not hard at all  Food Insecurity: No Food Insecurity (04/15/2023)   Hunger Vital Sign    Worried About Running Out of Food in the Last Year: Never true    Ran Out of Food in the Last Year: Never true  Transportation Needs: No Transportation Needs (04/15/2023)   PRAPARE - Administrator, Civil Service (Medical): No    Lack of Transportation (Non-Medical): No  Physical Activity: Unknown (04/15/2023)   Exercise Vital Sign    Days of Exercise per Week: 0 days    Minutes of Exercise per Session: Not on file  Stress: Patient Declined (04/15/2023)   [employer] of Occupational Health - Occupational Stress Questionnaire    Feeling of Stress : Patient declined  Social Connections: Moderately Integrated (04/15/2023)   Social Connection and Isolation Panel [NHANES]    Frequency of Communication with Friends and Family: Three times a week    Frequency of Social Gatherings with Friends and Family: Once a week    Attends Religious Services: More than 4 times per year    Active Member of [employer] or Organizations: No    Attends Engineer, structural: Not on file    Marital Status: Married       Objective:   Physical Exam Vitals reviewed.  Constitutional:      General: She is not in acute distress.    Appearance: She is  well-developed.  HENT:     Head: Normocephalic and atraumatic.     Right Ear: Tympanic membrane normal.     Left Ear: Tympanic membrane normal.  Eyes:     Pupils: Pupils are equal, round, and reactive to light.  Neck:     Thyroid: No thyromegaly.  Cardiovascular:     Rate and Rhythm: Normal rate and regular rhythm.     Heart sounds: Normal heart sounds. No murmur heard. Pulmonary:     Effort: Pulmonary effort is normal. No respiratory distress.     Breath sounds: Normal breath sounds. No wheezing.  Abdominal:     General: Bowel sounds are normal. There is no distension.     Palpations: Abdomen is soft.     Tenderness: There is no abdominal tenderness.  Musculoskeletal:        General: No tenderness. Normal range of motion.     Cervical back: Normal range of motion and neck supple.  Skin:    General: Skin is warm and dry.  Neurological:     Mental Status: She is alert and oriented to person, place, and time.     Cranial Nerves: No cranial nerve deficit.  Deep Tendon Reflexes: Reflexes are normal and symmetric.  Psychiatric:        Behavior: Behavior normal.        Thought Content: Thought content normal.        Judgment: Judgment normal.       BP 138/86   Pulse 83   Temp (!) 97.5 F (36.4 C)   Resp 18   Ht 5\' 4"  (1.626 m)   Wt 151 lb 12.8 oz (68.9 kg)   SpO2 98%   BMI 26.06 kg/m      Assessment & Plan:  Elliot Murrey comes in today with chief complaint of Medical Management of Chronic Issues   Diagnosis and orders addressed:  1. Hyperlipidemia associated with type 2 diabetes mellitus (HCC) - atorvastatin (LIPITOR) 10 MG tablet; Take 1 tablet (10 mg total) by mouth daily.  Dispense: 90 tablet; Refill: 1 - CBC with Differential/Platelet - CMP14+EGFR - Lipid panel  2. Depression, unspecified depression type - desvenlafaxine (PRISTIQ) 100 MG 24 hr tablet; Take 1 tablet (100 mg total) by mouth daily.  Dispense: 90 tablet; Refill: 0 - CBC with  Differential/Platelet - CMP14+EGFR  3. GAD (generalized anxiety disorder) - desvenlafaxine (PRISTIQ) 100 MG 24 hr tablet; Take 1 tablet (100 mg total) by mouth daily.  Dispense: 90 tablet; Refill: 0 - propranolol (INDERAL) 20 MG tablet; Take 1 tablet (20 mg total) by mouth 2 (two) times daily.  Dispense: 180 tablet; Refill: 1 - CBC with Differential/Platelet - CMP14+EGFR  4. Insomnia, unspecified type - desvenlafaxine (PRISTIQ) 100 MG 24 hr tablet; Take 1 tablet (100 mg total) by mouth daily.  Dispense: 90 tablet; Refill: 0 - traZODone (DESYREL) 100 MG tablet; Take 1-1.5 tablets (100-150 mg total) by mouth at bedtime.  Dispense: 135 tablet; Refill: 1 - CBC with Differential/Platelet - CMP14+EGFR  5. Type 2 diabetes mellitus with other specified complication, without long-term current use of insulin (HCC) - Semaglutide, 1 MG/DOSE, 4 MG/3ML SOPN; Inject 1 mg into the skin once a week.  Dispense: 9 mL; Refill: 1 - Bayer DCA Hb A1c Waived - CBC with Differential/Platelet - CMP14+EGFR - Microalbumin / creatinine urine ratio  6. Osteoarthritis of multiple joints, unspecified osteoarthritis type - traMADol (ULTRAM) 50 MG tablet; Take 1 tablet (50 mg total) by mouth every 8 (eight) hours as needed.  Dispense: 60 tablet; Refill: 2 - CBC with Differential/Platelet - CMP14+EGFR  7. Fibromyalgia - traMADol (ULTRAM) 50 MG tablet; Take 1 tablet (50 mg total) by mouth every 8 (eight) hours as needed.  Dispense: 60 tablet; Refill: 2 - CBC with Differential/Platelet - CMP14+EGFR  8. Controlled substance agreement signed - traMADol (ULTRAM) 50 MG tablet; Take 1 tablet (50 mg total) by mouth every 8 (eight) hours as needed.  Dispense: 60 tablet; Refill: 2 - CBC with Differential/Platelet - CMP14+EGFR  9. Annual physical exam - Bayer DCA Hb A1c Waived - CBC with Differential/Platelet - CMP14+EGFR - Lipid panel - Microalbumin / creatinine urine ratio  10. Essential hypertension - propranolol  (INDERAL) 20 MG tablet; Take 1 tablet (20 mg total) by mouth 2 (two) times daily.  Dispense: 180 tablet; Refill: 1 - CBC with Differential/Platelet - CMP14+EGFR  11. Nausea Start zofran as needed - ondansetron (ZOFRAN) 4 MG tablet; Take 1 tablet (4 mg total) by mouth every 8 (eight) hours as needed for nausea or vomiting.  Dispense: 90 tablet; Refill: 2  12. Encounter for immunization - Flu Vaccine Trivalent High Dose (Fluad)   Labs pending Patient reviewed in [place]  controlled database, no flags noted. Contract and drug screen are up to date.  Health Maintenance reviewed Diet and exercise encouraged  Follow up plan: 3 months    Jannifer Rodney, FNP

## 2023-04-17 LAB — CBC WITH DIFFERENTIAL/PLATELET
Basophils Absolute: 0.1 10*3/uL (ref 0.0–0.2)
Basos: 1 %
EOS (ABSOLUTE): 0.4 10*3/uL (ref 0.0–0.4)
Eos: 5 %
Hematocrit: 44 % (ref 34.0–46.6)
Hemoglobin: 14.2 g/dL (ref 11.1–15.9)
Immature Grans (Abs): 0 10*3/uL (ref 0.0–0.1)
Immature Granulocytes: 0 %
Lymphocytes Absolute: 2.4 10*3/uL (ref 0.7–3.1)
Lymphs: 28 %
MCH: 30 pg (ref 26.6–33.0)
MCHC: 32.3 g/dL (ref 31.5–35.7)
MCV: 93 fL (ref 79–97)
Monocytes Absolute: 0.7 10*3/uL (ref 0.1–0.9)
Monocytes: 9 %
Neutrophils Absolute: 4.9 10*3/uL (ref 1.4–7.0)
Neutrophils: 57 %
Platelets: 329 10*3/uL (ref 150–450)
RBC: 4.73 x10E6/uL (ref 3.77–5.28)
RDW: 12.2 % (ref 11.7–15.4)
WBC: 8.5 10*3/uL (ref 3.4–10.8)

## 2023-04-17 LAB — MICROALBUMIN / CREATININE URINE RATIO
Creatinine, Urine: 58.3 mg/dL
Microalb/Creat Ratio: 26 mg/g{creat} (ref 0–29)
Microalbumin, Urine: 15.2 ug/mL

## 2023-04-17 LAB — CMP14+EGFR
ALT: 28 [IU]/L (ref 0–32)
AST: 18 [IU]/L (ref 0–40)
Albumin: 4.4 g/dL (ref 3.9–4.9)
Alkaline Phosphatase: 82 [IU]/L (ref 44–121)
BUN/Creatinine Ratio: 16 (ref 12–28)
BUN: 12 mg/dL (ref 8–27)
Bilirubin Total: 0.4 mg/dL (ref 0.0–1.2)
CO2: 26 mmol/L (ref 20–29)
Calcium: 9.7 mg/dL (ref 8.7–10.3)
Chloride: 102 mmol/L (ref 96–106)
Creatinine, Ser: 0.75 mg/dL (ref 0.57–1.00)
Globulin, Total: 2.3 g/dL (ref 1.5–4.5)
Glucose: 118 mg/dL — ABNORMAL HIGH (ref 70–99)
Potassium: 5.1 mmol/L (ref 3.5–5.2)
Sodium: 142 mmol/L (ref 134–144)
Total Protein: 6.7 g/dL (ref 6.0–8.5)
eGFR: 91 mL/min/{1.73_m2} (ref >59)

## 2023-04-17 LAB — LIPID PANEL
Chol/HDL Ratio: 4 ratio (ref 0.0–4.4)
Cholesterol, Total: 158 mg/dL (ref 100–199)
HDL: 40 mg/dL (ref >39)
LDL Chol Calc (NIH): 88 mg/dL (ref 0–99)
Triglycerides: 176 mg/dL — ABNORMAL HIGH (ref 0–149)
VLDL Cholesterol Cal: 30 mg/dL (ref 5–40)

## 2023-04-20 ENCOUNTER — Other Ambulatory Visit: Payer: Self-pay | Admitting: Family

## 2023-04-22 ENCOUNTER — Telehealth: Payer: Self-pay | Admitting: Family

## 2023-04-22 DIAGNOSIS — E1169 Type 2 diabetes mellitus with other specified complication: Secondary | ICD-10-CM

## 2023-04-22 MED ORDER — SEMAGLUTIDE (1 MG/DOSE) 4 MG/3ML ~~LOC~~ SOPN: 1.0000 mg | PEN_INJECTOR | SUBCUTANEOUS | 1 refills | Status: DC

## 2023-04-22 NOTE — Telephone Encounter (Signed)
Pharmacy called stating that they need PCP to resend pts Semaglutide Rx because the one that was sent is missing the dosage.

## 2023-04-22 NOTE — Telephone Encounter (Signed)
Resent with dose.

## 2023-04-23 ENCOUNTER — Telehealth: Payer: Self-pay | Admitting: Family

## 2023-04-23 NOTE — Telephone Encounter (Signed)
RETURNED CALL, NO ANSWER, LEFT MESSAGE TO RETURN CALL 

## 2023-04-23 NOTE — Telephone Encounter (Signed)
Ok for verbal order. I signed paperwork to be faxed last week.

## 2023-04-28 ENCOUNTER — Telehealth: Payer: Self-pay | Admitting: Pharmacist

## 2023-04-28 NOTE — Telephone Encounter (Signed)
Peggy Hall says she got the fax that was signed but not filled out. Please call back for confirmation.

## 2023-04-28 NOTE — Telephone Encounter (Signed)
Yes, please give them a verbal.   Jannifer Rodney, FNP

## 2023-04-28 NOTE — Telephone Encounter (Signed)
   Patient enrolled in the [employer] patient assistance program for Ozempic.  Ozempic 1mg  weekly (#4 boxes) arrived at PCP office pick up.  Patient is stable on current regimen.  She will need to re-enroll for 2025 this year.

## 2023-05-19 ENCOUNTER — Ambulatory Visit (INDEPENDENT_AMBULATORY_CARE_PROVIDER_SITE_OTHER): Payer: Self-pay

## 2023-05-19 ENCOUNTER — Encounter: Payer: Self-pay | Admitting: Family Medicine

## 2023-05-19 ENCOUNTER — Ambulatory Visit: Payer: Self-pay | Admitting: Family Medicine

## 2023-05-19 VITALS — BP 134/77 | HR 79 | Temp 97.9°F | Ht 64.0 in | Wt 154.0 lb

## 2023-05-19 DIAGNOSIS — R051 Acute cough: Secondary | ICD-10-CM

## 2023-05-19 DIAGNOSIS — R6889 Other general symptoms and signs: Secondary | ICD-10-CM | POA: Diagnosis not present

## 2023-05-19 DIAGNOSIS — J029 Acute pharyngitis, unspecified: Secondary | ICD-10-CM

## 2023-05-19 LAB — VERITOR FLU A/B WAIVED
Influenza A: NEGATIVE
Influenza B: NEGATIVE

## 2023-05-19 LAB — RAPID STREP SCREEN (MED CTR MEBANE ONLY): Strep Gp A Ag, IA W/Reflex: NEGATIVE

## 2023-05-19 LAB — CULTURE, GROUP A STREP

## 2023-05-19 MED ORDER — FLUTICASONE PROPIONATE 50 MCG/ACT NA SUSP: 2.0000 | Freq: Every day | NASAL | 6 refills | Status: DC

## 2023-05-19 MED ORDER — BENZONATATE 200 MG PO CAPS: 200.0000 mg | ORAL_CAPSULE | Freq: Three times a day (TID) | ORAL | 0 refills | Status: DC | PRN

## 2023-05-19 MED ORDER — PROMETHAZINE-DM 6.25-15 MG/5ML PO SYRP: 2.5000 mL | ORAL_SOLUTION | Freq: Four times a day (QID) | ORAL | 0 refills | Status: DC | PRN

## 2023-05-19 NOTE — Patient Instructions (Signed)
It appears that you have a viral upper respiratory infection (cold).  Cold symptoms can last up to 2 weeks.   ? ?- Get plenty of rest and drink plenty of fluids. ?- Try to breathe moist air. Use a cold mist humidifier. ?- Consume warm fluids (soup or tea) to provide relief for a stuffy nose and to loosen phlegm. ?- For nasal stuffiness, try saline nasal spray or a Neti Pot. Afrin nasal spray can also be used but this product should not be used longer than 3 days or it will cause rebound nasal stuffiness (worsening nasal congestion). ?- For sore throat pain relief: use chloraseptic spray, suck on throat lozenges, hard candy or popsicles; gargle with warm salt water (1/4 tsp. salt per 8 oz. of water); and eat soft, bland foods. ?- Eat a well-balanced diet. If you cannot, ensure you are getting enough nutrients by taking a daily multivitamin. ?- Avoid dairy products, as they can thicken phlegm. ?- Avoid alcohol, as it impairs your body?s immune system. ? ?CONTACT YOUR DOCTOR IF YOU EXPERIENCE ANY OF THE FOLLOWING: ?- High fever ?- Ear pain ?- Sinus-type headache ?- Unusually severe cold symptoms ?- Cough that gets worse while other cold symptoms improve ?- Flare up of any chronic lung problem, such as asthma ?- Your symptoms persist longer than 2 weeks ? ? ?

## 2023-05-19 NOTE — Progress Notes (Signed)
Subjective: CC: URI PCP: Junie Spencer, FNP [id number] Peggy Hall is a 62 y.o. female presenting to clinic today for:  1.  URI Patient reports onset Saturday.  This started with sneezing and then proceeded to become rhinorrhea, nasal congestion and sore throat.  She then developed a cough that has not had any hemoptysis or brown sputum.  No shortness of breath or wheezing but she does report some chest tightness.  She has had multiple sick contacts, whom have had walking pneumonia at home.  She has been using DayQuil, NyQuil and Vicks vapor rub.   ROS: Per HPI  No Known Allergies Past Medical History:  Diagnosis Date   Cancer (HCC) 2006   cervical   Depression    Diabetes mellitus without complication (HCC)    Fibromyalgia    Hepatic steatosis    Hyperlipidemia    Hypertension     Current Outpatient Medications:    atorvastatin (LIPITOR) 10 MG tablet, Take 1 tablet (10 mg total) by mouth daily., Disp: 90 tablet, Rfl: 1   desvenlafaxine (PRISTIQ) 100 MG 24 hr tablet, Take 1 tablet (100 mg total) by mouth daily., Disp: 90 tablet, Rfl: 0   ondansetron (ZOFRAN) 4 MG tablet, Take 1 tablet (4 mg total) by mouth every 8 (eight) hours as needed for nausea or vomiting., Disp: 90 tablet, Rfl: 2   propranolol (INDERAL) 20 MG tablet, Take 1 tablet (20 mg total) by mouth 2 (two) times daily., Disp: 180 tablet, Rfl: 1   Semaglutide, 1 MG/DOSE, 4 MG/3ML SOPN, Inject 1 mg into the skin once a week., Disp: 9 mL, Rfl: 1   traMADol (ULTRAM) 50 MG tablet, Take 1 tablet (50 mg total) by mouth every 8 (eight) hours as needed., Disp: 60 tablet, Rfl: 2   traZODone (DESYREL) 100 MG tablet, Take 1-1.5 tablets (100-150 mg total) by mouth at bedtime., Disp: 135 tablet, Rfl: 1 Social History   Socioeconomic History   Marital status: Married    Spouse name: Not on file   Number of children: Not on file   Years of education: Not on file   Highest education level: 12th grade  Occupational History   Not on  file  Tobacco Use   Smoking status: Former    Types: Cigarettes   Smokeless tobacco: Never  Vaping Use   Vaping status: Never Used  Substance and Sexual Activity   Alcohol use: No   Drug use: No   Sexual activity: Not on file  Other Topics Concern   Not on file  Social History Narrative   Not on file   Social Determinants of Health   Financial Resource Strain: Low Risk  (04/15/2023)   Overall Financial Resource Strain (CARDIA)    Difficulty of Paying Living Expenses: Not hard at all  Food Insecurity: No Food Insecurity (04/15/2023)   Hunger Vital Sign    Worried About Running Out of Food in the Last Year: Never true    Ran Out of Food in the Last Year: Never true  Transportation Needs: No Transportation Needs (04/15/2023)   PRAPARE - Administrator, Civil Service (Medical): No    Lack of Transportation (Non-Medical): No  Physical Activity: Unknown (04/15/2023)   Exercise Vital Sign    Days of Exercise per Week: 0 days    Minutes of Exercise per Session: Not on file  Stress: Patient Declined (04/15/2023)   [employer] of Occupational Health - Occupational Stress Questionnaire    Feeling of Stress :  Patient declined  Social Connections: Moderately Integrated (04/15/2023)   Social Connection and Isolation Panel [NHANES]    Frequency of Communication with Friends and Family: Three times a week    Frequency of Social Gatherings with Friends and Family: Once a week    Attends Religious Services: More than 4 times per year    Active Member of [employer] or Organizations: No    Attends Engineer, structural: Not on file    Marital Status: Married  Intimate Partner Violence: Unknown (01/02/2023)   Received from [facility]   HITS    Physically Hurt: Not on file    Insult or Talk Down To: Not on file    Threaten Physical Harm: Not on file    Scream or Curse: Not on file   Family History  Problem Relation Age of Onset   Stroke Mother    Heart disease  Mother    Depression Mother    Dementia Mother    Stroke Father    Hypertension Father    Diabetes Father    Hyperlipidemia Father    Peripheral Artery Disease Father    Bipolar disorder Sister    Cancer Maternal Grandmother            Colon cancer Maternal Grandmother    Liver disease Neg Hx     Objective: Office vital signs reviewed. BP 134/77   Pulse 79   Temp 97.9 F (36.6 C)   Ht 5\' 4"  (1.626 m)   Wt 154 lb (69.9 kg)   SpO2 94%   BMI 26.43 kg/m   Physical Examination:  General: Awake, alert, toxic female, No acute distress HEENT: Normal    Neck: No masses palpated. No lymphadenopathy    Ears: Tympanic membranes intact, normal light reflex, no erythema, no bulging    Eyes: PERRLA, extraocular membranes intact, sclera white    Nose: nasal turbinates moist, clear nasal discharge    Throat: moist mucus membranes, mild oropharyngeal erythema, no tonsillar exudate.  Airway is patent Cardio: regular rate and rhythm, S1S2 heard, no murmurs appreciated Pulm: clear to auscultation bilaterally, no wheezes, rhonchi or rales; normal work of breathing on room air   DG Chest 2 View  Result Date: 05/19/2023 CLINICAL DATA:  cough/ flu like symptoms. multiple family members with walking PNA EXAM: CHEST - 2 VIEW COMPARISON:  None Available. FINDINGS: The heart and mediastinal contours are within normal limits. Slightly prominent hilar vasculature likely due to low lung volumes. No focal consolidation. No pulmonary edema. No pleural effusion. No pneumothorax. No acute osseous abnormality. IMPRESSION: No active cardiopulmonary disease. Electronically Signed   By: Tish Frederickson M.D.   On: 05/19/2023 08:54    Assessment/ Plan: 62 y.o. female   Sore throat - Plan: Veritor Flu A/B Waived, Rapid Strep Screen ([facility] ONLY), Novel Coronavirus, NAA ([facility])  Flu-like symptoms - Plan: Veritor Flu A/B Waived, Novel Coronavirus, NAA ([facility]), DG Chest 2 View, benzonatate (TESSALON)  200 MG capsule, promethazine-dextromethorphan (PROMETHAZINE-DM) 6.25-15 MG/5ML syrup, fluticasone (FLONASE) 50 MCG/ACT nasal spray  Acute cough - Plan: DG Chest 2 View, benzonatate (TESSALON) 200 MG capsule, promethazine-dextromethorphan (PROMETHAZINE-DM) 6.25-15 MG/5ML syrup  Chest x-ray negative for pneumonia.  Suspect this is a viral illness.  Negative flu, negative strep.  COVID sent.  Her physical exam was largely unremarkable.  Home care instructions reviewed with the patient.  Continue supportive care.  Cough medications prescribed.  Reviewed red flag signs and symptoms warranting further evaluation.  Follow-up as needed  Raliegh Ip, DO [facility] [phone]

## 2023-05-20 LAB — NOVEL CORONAVIRUS, NAA: SARS-CoV-2, NAA: NOT DETECTED

## 2023-07-02 ENCOUNTER — Telehealth: Payer: Self-pay | Admitting: Pharmacist

## 2023-07-10 ENCOUNTER — Encounter: Payer: Self-pay | Admitting: Pharmacist

## 2023-07-10 NOTE — Telephone Encounter (Signed)
   Mychart messgae sent to patient re: Peggy Hall.  Peggy Hall is not currently on her list and she has battled with insruance coverage in the past.  Will await patient response.  She is currently taking Ozempic 1mg  weekly (compounding pharmacy).  A1c 6.9% on 04/16/23.  Kieth Brightly, PharmD, BCACP, CPP Clinical Pharmacist, [facility]

## 2023-07-20 ENCOUNTER — Ambulatory Visit (INDEPENDENT_AMBULATORY_CARE_PROVIDER_SITE_OTHER): Payer: Self-pay | Admitting: Family

## 2023-07-20 ENCOUNTER — Encounter: Payer: Self-pay | Admitting: Family

## 2023-07-20 ENCOUNTER — Other Ambulatory Visit: Payer: Self-pay | Admitting: *Deleted

## 2023-07-20 VITALS — BP 137/86 | HR 77 | Temp 97.4°F | Ht 64.0 in | Wt 156.0 lb

## 2023-07-20 DIAGNOSIS — M797 Fibromyalgia: Secondary | ICD-10-CM

## 2023-07-20 DIAGNOSIS — M159 Polyosteoarthritis, unspecified: Secondary | ICD-10-CM

## 2023-07-20 DIAGNOSIS — Z122 Encounter for screening for malignant neoplasm of respiratory organs: Secondary | ICD-10-CM

## 2023-07-20 DIAGNOSIS — I10 Essential (primary) hypertension: Secondary | ICD-10-CM

## 2023-07-20 DIAGNOSIS — E1169 Type 2 diabetes mellitus with other specified complication: Secondary | ICD-10-CM

## 2023-07-20 DIAGNOSIS — F411 Generalized anxiety disorder: Secondary | ICD-10-CM

## 2023-07-20 DIAGNOSIS — Z87891 Personal history of nicotine dependence: Secondary | ICD-10-CM

## 2023-07-20 DIAGNOSIS — G4700 Insomnia, unspecified: Secondary | ICD-10-CM | POA: Diagnosis not present

## 2023-07-20 DIAGNOSIS — F32A Depression, unspecified: Secondary | ICD-10-CM | POA: Diagnosis not present

## 2023-07-20 DIAGNOSIS — E785 Hyperlipidemia, unspecified: Secondary | ICD-10-CM

## 2023-07-20 DIAGNOSIS — Z7985 Long-term (current) use of injectable non-insulin antidiabetic drugs: Secondary | ICD-10-CM

## 2023-07-20 DIAGNOSIS — Z79899 Other long term (current) drug therapy: Secondary | ICD-10-CM

## 2023-07-20 MED ORDER — TRAMADOL HCL 50 MG PO TABS: 50.0000 mg | ORAL_TABLET | Freq: Three times a day (TID) | ORAL | 2 refills | Status: DC | PRN

## 2023-07-20 MED ORDER — SEMAGLUTIDE (1 MG/DOSE) 4 MG/3ML ~~LOC~~ SOPN: 1.0000 mg | PEN_INJECTOR | SUBCUTANEOUS | 1 refills | Status: DC

## 2023-07-20 MED ORDER — ATORVASTATIN CALCIUM 10 MG PO TABS: 10.0000 mg | ORAL_TABLET | Freq: Every day | ORAL | 1 refills | Status: DC

## 2023-07-20 MED ORDER — TRAZODONE HCL 100 MG PO TABS: 100.0000 mg | ORAL_TABLET | Freq: Every day | ORAL | 1 refills | Status: DC

## 2023-07-20 MED ORDER — PROPRANOLOL HCL 20 MG PO TABS: 20.0000 mg | ORAL_TABLET | Freq: Two times a day (BID) | ORAL | 1 refills | Status: DC

## 2023-07-20 MED ORDER — DESVENLAFAXINE SUCCINATE ER 100 MG PO TB24: 100.0000 mg | ORAL_TABLET | Freq: Every day | ORAL | 0 refills | Status: DC

## 2023-07-20 NOTE — Patient Instructions (Signed)

## 2023-07-20 NOTE — Progress Notes (Signed)
Subjective:    Patient ID: Peggy Hall, female    DOB: 08-Sep-1960, 63 y.o.   MRN: [id number]  Chief Complaint  Patient presents with   Medical Management of Chronic Issues   Pt presents to the office today for chronic follow up. She is followed by Rheumatologists every 3 months for Fibromyalgia. She continues to have bilateral shoulders, knee, and back pain that has improved.    She has hepatic steatosis and followed by GI as needed.    Hypertension This is a chronic problem. The current episode started more than 1 year ago. The problem has been resolved since onset. The problem is controlled. Associated symptoms include anxiety and malaise/fatigue. Pertinent negatives include no blurred vision, peripheral edema or shortness of breath. Risk factors for coronary artery disease include dyslipidemia, diabetes mellitus, obesity and sedentary lifestyle. The current treatment provides moderate improvement.  Diabetes She presents for her follow-up diabetic visit. She has type 2 diabetes mellitus. Hypoglycemia symptoms include nervousness/anxiousness. Pertinent negatives for diabetes include no blurred vision and no foot paresthesias. Pertinent negatives for diabetic complications include no peripheral neuropathy. Risk factors for coronary artery disease include dyslipidemia, diabetes mellitus, hypertension, sedentary lifestyle and post-menopausal. She is following a generally unhealthy diet. Her overall blood glucose range is 140-180 mg/dl. Eye exam is not current.  Hyperlipidemia This is a chronic problem. The current episode started more than 1 year ago. The problem is controlled. Recent lipid tests were reviewed and are normal. Pertinent negatives include no shortness of breath. Current antihyperlipidemic treatment includes statins. The current treatment provides moderate improvement of lipids. Risk factors for coronary artery disease include dyslipidemia, diabetes mellitus, hypertension, a sedentary  lifestyle and post-menopausal.  Arthritis Presents for follow-up visit. She complains of pain and stiffness. Affected locations include the left knee, right knee, left MCP, right MCP, right shoulder and left shoulder. Her pain is at a severity of 7/10.  Insomnia Primary symptoms: sleep disturbance, difficulty falling asleep, malaise/fatigue.   The current episode started more than one year. The onset quality is gradual. The problem occurs intermittently. Past treatments include medication. The treatment provided moderate relief. PMH includes: depression.   Anxiety Presents for follow-up visit. Symptoms include decreased concentration, excessive worry, insomnia and nervous/anxious behavior. Patient reports no shortness of breath. Symptoms occur most days. The severity of symptoms is moderate.    Depression        This is a chronic problem.  The current episode started more than 1 year ago.   Associated symptoms include decreased concentration, helplessness, hopelessness, insomnia and sad.  Past treatments include SNRIs - Serotonin and norepinephrine reuptake inhibitors.  Past medical history includes anxiety.       Review of Systems  Constitutional:  Positive for malaise/fatigue.  Eyes:  Negative for blurred vision.  Respiratory:  Negative for shortness of breath.   Musculoskeletal:  Positive for stiffness.  Psychiatric/Behavioral:  Positive for decreased concentration, depression and sleep disturbance. The patient is nervous/anxious and has insomnia.   All other systems reviewed and are negative.   Family History  Problem Relation Age of Onset   Stroke Mother    Heart disease Mother    Depression Mother    Dementia Mother    Stroke Father    Hypertension Father    Diabetes Father    Hyperlipidemia Father    Peripheral Artery Disease Father    Bipolar disorder Sister    Cancer Maternal Grandmother  Colon cancer Maternal Grandmother    Liver disease Neg Hx    Social  History   Socioeconomic History   Marital status: Married    Spouse name: Not on file   Number of children: Not on file   Years of education: Not on file   Highest education level: 12th grade  Occupational History   Not on file  Tobacco Use   Smoking status: Former    Types: Cigarettes   Smokeless tobacco: Never  Vaping Use   Vaping status: Never Used  Substance and Sexual Activity   Alcohol use: No   Drug use: No   Sexual activity: Not on file  Other Topics Concern   Not on file  Social History Narrative   Not on file   Social Drivers of Health   Financial Resource Strain: Low Risk  (07/19/2023)   Overall Financial Resource Strain (CARDIA)    Difficulty of Paying Living Expenses: Not very hard  Food Insecurity: No Food Insecurity (07/19/2023)   Hunger Vital Sign    Worried About Running Out of Food in the Last Year: Never true    Ran Out of Food in the Last Year: Never true  Transportation Needs: No Transportation Needs (07/19/2023)   PRAPARE - Administrator, Civil Service (Medical): No    Lack of Transportation (Non-Medical): No  Physical Activity: Unknown (07/19/2023)   Exercise Vital Sign    Days of Exercise per Week: 0 days    Minutes of Exercise per Session: Not on file  Stress: Stress Concern Present (07/19/2023)   [employer] of Occupational Health - Occupational Stress Questionnaire    Feeling of Stress : Rather much  Social Connections: Moderately Integrated (07/19/2023)   Social Connection and Isolation Panel [NHANES]    Frequency of Communication with Friends and Family: More than three times a week    Frequency of Social Gatherings with Friends and Family: Once a week    Attends Religious Services: More than 4 times per year    Active Member of [employer] or Organizations: No    Attends Engineer, structural: Not on file    Marital Status: Married       Objective:   Physical Exam Vitals reviewed.  Constitutional:      General:  She is not in acute distress.    Appearance: She is well-developed.  HENT:     Head: Normocephalic and atraumatic.     Right Ear: Tympanic membrane normal.     Left Ear: Tympanic membrane normal.  Eyes:     Pupils: Pupils are equal, round, and reactive to light.  Neck:     Thyroid: No thyromegaly.  Cardiovascular:     Rate and Rhythm: Normal rate and regular rhythm.     Heart sounds: Normal heart sounds. No murmur heard. Pulmonary:     Effort: Pulmonary effort is normal. No respiratory distress.     Breath sounds: Normal breath sounds. No wheezing.  Abdominal:     General: Bowel sounds are normal. There is no distension.     Palpations: Abdomen is soft.     Tenderness: There is no abdominal tenderness.  Musculoskeletal:        General: No tenderness. Normal range of motion.     Cervical back: Normal range of motion and neck supple.  Skin:    General: Skin is warm and dry.  Neurological:     Mental Status: She is alert and oriented to person, place, and  time.     Cranial Nerves: No cranial nerve deficit.     Deep Tendon Reflexes: Reflexes are normal and symmetric.  Psychiatric:        Behavior: Behavior normal.        Thought Content: Thought content normal.        Judgment: Judgment normal.       BP 137/86   Pulse 77   Temp (!) 97.4 F (36.3 C) (Temporal)   Ht 5\' 4"  (1.626 m)   Wt 156 lb (70.8 kg)   SpO2 99%   BMI 26.78 kg/m      Assessment & Plan:  Peggy Hall comes in today with chief complaint of Medical Management of Chronic Issues   Diagnosis and orders addressed:  1. Hyperlipidemia associated with type 2 diabetes mellitus (HCC) - atorvastatin (LIPITOR) 10 MG tablet; Take 1 tablet (10 mg total) by mouth daily.  Dispense: 90 tablet; Refill: 1 - Bayer DCA Hb A1c Waived  2. Depression, unspecified depression type - desvenlafaxine (PRISTIQ) 100 MG 24 hr tablet; Take 1 tablet (100 mg total) by mouth daily.  Dispense: 90 tablet; Refill: 0 - [facility] DCA Hb A1c  Waived  3. GAD (generalized anxiety disorder) - desvenlafaxine (PRISTIQ) 100 MG 24 hr tablet; Take 1 tablet (100 mg total) by mouth daily.  Dispense: 90 tablet; Refill: 0 - propranolol (INDERAL) 20 MG tablet; Take 1 tablet (20 mg total) by mouth 2 (two) times daily.  Dispense: 180 tablet; Refill: 1 - Bayer DCA Hb A1c Waived  4. Insomnia, unspecified type - desvenlafaxine (PRISTIQ) 100 MG 24 hr tablet; Take 1 tablet (100 mg total) by mouth daily.  Dispense: 90 tablet; Refill: 0 - traZODone (DESYREL) 100 MG tablet; Take 1-1.5 tablets (100-150 mg total) by mouth at bedtime.  Dispense: 135 tablet; Refill: 1 - Bayer DCA Hb A1c Waived  5. Essential hypertension - propranolol (INDERAL) 20 MG tablet; Take 1 tablet (20 mg total) by mouth 2 (two) times daily.  Dispense: 180 tablet; Refill: 1 - Bayer DCA Hb A1c Waived  6. Type 2 diabetes mellitus with other specified complication, without long-term current use of insulin (HCC) (Primary) - Semaglutide, 1 MG/DOSE, 4 MG/3ML SOPN; Inject 1 mg into the skin once a week.  Dispense: 9 mL; Refill: 1 - CMP14+EGFR - Bayer DCA Hb A1c Waived  7. Osteoarthritis of multiple joints, unspecified osteoarthritis type - traMADol (ULTRAM) 50 MG tablet; Take 1 tablet (50 mg total) by mouth every 8 (eight) hours as needed.  Dispense: 60 tablet; Refill: 2 - Bayer DCA Hb A1c Waived  8. Fibromyalgia - traMADol (ULTRAM) 50 MG tablet; Take 1 tablet (50 mg total) by mouth every 8 (eight) hours as needed.  Dispense: 60 tablet; Refill: 2 - Bayer DCA Hb A1c Waived  9. Controlled substance agreement signed - traMADol (ULTRAM) 50 MG tablet; Take 1 tablet (50 mg total) by mouth every 8 (eight) hours as needed.  Dispense: 60 tablet; Refill: 2 - Bayer DCA Hb A1c Waived  10. Encounter for screening for lung cancer - Ambulatory Referral Lung Cancer Screening [facility] - Bayer DCA Hb A1c Waived    Labs pending Patient reviewed in  controlled database, no flags  noted. Contract and drug screen are up to date.  Health Maintenance reviewed Diet and exercise encouraged  Follow up plan: 3 months    Jannifer Rodney, FNP

## 2023-08-03 ENCOUNTER — Telehealth: Payer: Self-pay | Admitting: Acute Care

## 2023-08-03 NOTE — Telephone Encounter (Signed)
 [facility] called to let us  know that the authorization for CT scan on 2/26 was denied.

## 2023-08-18 ENCOUNTER — Encounter: Payer: Self-pay | Admitting: Acute Care

## 2023-08-19 ENCOUNTER — Other Ambulatory Visit: Payer: Self-pay

## 2023-08-19 ENCOUNTER — Other Ambulatory Visit: Payer: Self-pay | Admitting: Family

## 2023-08-19 NOTE — Telephone Encounter (Unsigned)
 Copied from CRM [id number]. Topic: Clinical - Medication Refill >> Aug 19, 2023  3:30 PM Martha Clan wrote: Most Recent Primary Care Visit:  Provider: Jannifer Rodney A  Department: [facility]  Visit Type: OFFICE VISIT  Date: 07/20/2023  Medication: Semaglutide, 1 MG/DOSE, 4 MG/3ML SOPN [[id number]]  Has the patient contacted their pharmacy? Yes (Agent: If no, request that the patient contact the pharmacy for the refill. If patient does not wish to contact the pharmacy document the reason why and proceed with request.) (Agent: If yes, when and what did the pharmacy advise?)  Is this the correct pharmacy for this prescription? Yes If no, delete pharmacy and type the correct one.  This is the patient's preferred pharmacy:    [facility] - [place], [place] - [phone] [place] [address] Phone: [phone] Fax: [phone]   Has the prescription been filled recently? No  Is the patient out of the medication? No Patient has about 3 weeks left Has the patient been seen for an appointment in the last year OR does the patient have an upcoming appointment? Yes  Can we respond through MyChart? Yes  Agent: Please be advised that Rx refills may take up to 3 business days. We ask that you follow-up with your pharmacy.

## 2023-09-03 ENCOUNTER — Encounter: Payer: Self-pay | Admitting: Family Medicine

## 2023-09-03 ENCOUNTER — Telehealth: Payer: Self-pay

## 2023-09-03 ENCOUNTER — Ambulatory Visit (INDEPENDENT_AMBULATORY_CARE_PROVIDER_SITE_OTHER): Payer: PRIVATE HEALTH INSURANCE | Admitting: Family Medicine

## 2023-09-03 VITALS — BP 135/83 | HR 93 | Temp 97.2°F | Ht 64.0 in | Wt 155.0 lb

## 2023-09-03 DIAGNOSIS — L723 Sebaceous cyst: Secondary | ICD-10-CM

## 2023-09-03 NOTE — Telephone Encounter (Signed)
 Called to inform patient that Ozempic has arrived, patient will come pick up today. [facility] 09/03/2023

## 2023-09-03 NOTE — Progress Notes (Signed)
 BP 135/83   Pulse 93   Temp (!) 97.2 F (36.2 C)   Ht 5\' 4"  (1.626 m)   Wt 155 lb (70.3 kg)   SpO2 100%   BMI 26.61 kg/m    Subjective:   Patient ID: Peggy Hall, female    DOB: 1961-02-28, 63 y.o.   MRN: [id number]  HPI: Peggy Hall is a 63 y.o. female presenting on 09/03/2023 for Cyst (Right upper back. Enlarging and sore.)   HPI Painful cyst on her back Patient has a painful cyst on her back.  She has had cysts like this before that removed because they were painful and sore.  This 1 is under her bra line on the right upper back near the midaxillary line.  She says it has been very sore but not red or infected or drained but just that it causes her a lot of irritation and soreness.  She would like it removed  Relevant past medical, surgical, family and social history reviewed and updated as indicated. Interim medical history since our last visit reviewed. Allergies and medications reviewed and updated.  Review of Systems  Constitutional:  Negative for chills and fever.  Skin:  Positive for rash. Negative for color change and wound.    Per HPI unless specifically indicated above   Allergies as of 09/03/2023   No Known Allergies      Medication List        Accurate as of September 03, 2023  3:10 PM. If you have any questions, ask your nurse or doctor.          atorvastatin 10 MG tablet Commonly known as: LIPITOR Take 1 tablet (10 mg total) by mouth daily.   celecoxib 200 MG capsule Commonly known as: CELEBREX   desvenlafaxine 100 MG 24 hr tablet Commonly known as: PRISTIQ Take 1 tablet (100 mg total) by mouth daily.   gabapentin 300 MG capsule Commonly known as: NEURONTIN   ondansetron 4 MG tablet Commonly known as: Zofran Take 1 tablet (4 mg total) by mouth every 8 (eight) hours as needed for nausea or vomiting.   propranolol 20 MG tablet Commonly known as: INDERAL Take 1 tablet (20 mg total) by mouth 2 (two) times daily.   Semaglutide (1 MG/DOSE) 4  MG/3ML Sopn Inject 1 mg into the skin once a week.   traMADol 50 MG tablet Commonly known as: ULTRAM Take 1 tablet (50 mg total) by mouth every 8 (eight) hours as needed.   traZODone 100 MG tablet Commonly known as: DESYREL Take 1-1.5 tablets (100-150 mg total) by mouth at bedtime.         Objective:   BP 135/83   Pulse 93   Temp (!) 97.2 F (36.2 C)   Ht 5\' 4"  (1.626 m)   Wt 155 lb (70.3 kg)   SpO2 100%   BMI 26.61 kg/m   Wt Readings from Last 3 Encounters:  09/03/23 155 lb (70.3 kg)  07/20/23 156 lb (70.8 kg)  05/19/23 154 lb (69.9 kg)    Physical Exam Vitals and nursing note reviewed.  Constitutional:      Appearance: Normal appearance.  Skin:    Findings: Lesion (Slightly tender sebaceous cyst on her right upper back near the midaxillary line, 0.5 cm of induration in size) present.  Neurological:     Mental Status: She is alert.       Assessment & Plan:   Problem List Items Addressed This Visit   None Visit Diagnoses  Inflamed sebaceous cyst    -  Primary   Relevant Orders   Ambulatory referral to Dermatology       Will refer to dermatology for removal. Follow up plan: Return if symptoms worsen or fail to improve.  Counseling provided for all of the vaccine components Orders Placed This Encounter  Procedures   Ambulatory referral to Dermatology    Arville Care, MD [facility] 09/03/2023, 3:10 PM

## 2023-09-07 LAB — HM DIABETES EYE EXAM

## 2023-09-15 ENCOUNTER — Ambulatory Visit (INDEPENDENT_AMBULATORY_CARE_PROVIDER_SITE_OTHER): Payer: PRIVATE HEALTH INSURANCE | Admitting: Family

## 2023-09-15 ENCOUNTER — Encounter: Payer: Self-pay | Admitting: Family

## 2023-09-15 VITALS — BP 156/84 | HR 89 | Temp 97.4°F | Ht 64.0 in | Wt 156.6 lb

## 2023-09-15 DIAGNOSIS — R350 Frequency of micturition: Secondary | ICD-10-CM | POA: Diagnosis not present

## 2023-09-15 LAB — URINALYSIS, COMPLETE
Bilirubin, UA: NEGATIVE
Ketones, UA: NEGATIVE
Leukocytes,UA: NEGATIVE
Nitrite, UA: NEGATIVE
Protein,UA: NEGATIVE
RBC, UA: NEGATIVE
Specific Gravity, UA: 1.015 (ref 1.005–1.030)
Urobilinogen, Ur: 0.2 mg/dL (ref 0.2–1.0)
pH, UA: 5.5 (ref 5.0–7.5)

## 2023-09-15 LAB — MICROSCOPIC EXAMINATION
RBC, Urine: NONE SEEN /HPF (ref 0–2)
Renal Epithel, UA: NONE SEEN /HPF
Yeast, UA: NONE SEEN

## 2023-09-15 NOTE — Progress Notes (Signed)
 Subjective:    Patient ID: Peggy Hall, female    DOB: Jul 23, 1960, 63 y.o.   MRN: [id number]  Chief Complaint  Patient presents with   Dysuria   PT presents to the office today with urinary hesitancy that started 3 days ago. Reports her symptoms are improved today.   Urinary Frequency  This is a new problem. The current episode started in the past 7 days. The problem occurs intermittently. The problem has been gradually improving. The pain is at a severity of 0/10. The patient is experiencing no pain. Associated symptoms include frequency, hesitancy and urgency. Pertinent negatives include no discharge, flank pain, hematuria, nausea or vomiting. She has tried increased fluids for the symptoms. The treatment provided mild relief.      Review of Systems  Gastrointestinal:  Negative for nausea and vomiting.  Genitourinary:  Positive for frequency, hesitancy and urgency. Negative for flank pain and hematuria.  All other systems reviewed and are negative.   Social History   Socioeconomic History   Marital status: Married    Spouse name: Not on file   Number of children: Not on file   Years of education: Not on file   Highest education level: 12th grade  Occupational History   Not on file  Tobacco Use   Smoking status: Former    Types: Cigarettes   Smokeless tobacco: Never  Vaping Use   Vaping status: Never Used  Substance and Sexual Activity   Alcohol use: No   Drug use: No   Sexual activity: Not on file  Other Topics Concern   Not on file  Social History Narrative   Not on file   Social Drivers of Health   Financial Resource Strain: Low Risk  (09/07/2023)   Received from [facility]   Overall Financial Resource Strain (CARDIA)    Difficulty of Paying Living Expenses: Not hard at all  Food Insecurity: No Food Insecurity (09/07/2023)   [facility]   Hunger Vital Sign    Worried About Running Out of Food in the Last Year: Never true    Ran Out of  Food in the Last Year: Never true  Transportation Needs: No Transportation Needs (09/07/2023)   [facility] - Transportation    Lack of Transportation (Medical): No    Lack of Transportation (Non-Medical): No  Physical Activity: Unknown (09/07/2023)   Received from [facility]   Exercise Vital Sign    Days of Exercise per Week: 0 days    Minutes of Exercise per Session: Not on file  Stress: No Stress Concern Present (09/07/2023)   Received from [facility] of Occupational Health - Occupational Stress Questionnaire    Feeling of Stress : Not at all  Recent Concern: Stress - Stress Concern Present (07/19/2023)   [employer] of Occupational Health - Occupational Stress Questionnaire    Feeling of Stress : Rather much  Social Connections: Socially Integrated (09/07/2023)   Received from [facility]   Social Network    How would you rate your social network (family, work, friends)?: Good participation with social networks   Family History  Problem Relation Age of Onset   Stroke Mother    Heart disease Mother    Depression Mother    Dementia Mother    Stroke Father    Hypertension Father    Diabetes Father    Hyperlipidemia Father    Peripheral Artery Disease Father  Bipolar disorder Sister    Cancer Maternal Grandmother            Colon cancer Maternal Grandmother    Liver disease Neg Hx         Objective:   Physical Exam Vitals reviewed.  Constitutional:      General: She is not in acute distress.    Appearance: She is well-developed.  HENT:     Head: Normocephalic and atraumatic.  Eyes:     Pupils: Pupils are equal, round, and reactive to light.  Neck:     Thyroid: No thyromegaly.  Cardiovascular:     Rate and Rhythm: Normal rate and regular rhythm.     Heart sounds: Normal heart sounds. No murmur heard. Pulmonary:     Effort: Pulmonary effort is normal. No respiratory distress.     Breath sounds: Normal  breath sounds. No wheezing.  Abdominal:     General: Bowel sounds are normal. There is no distension.     Palpations: Abdomen is soft.     Tenderness: There is no abdominal tenderness.  Musculoskeletal:        General: No tenderness. Normal range of motion.     Cervical back: Normal range of motion and neck supple.  Skin:    General: Skin is warm and dry.  Neurological:     Mental Status: She is alert and oriented to person, place, and time.     Cranial Nerves: No cranial nerve deficit.     Deep Tendon Reflexes: Reflexes are normal and symmetric.  Psychiatric:        Behavior: Behavior normal.        Thought Content: Thought content normal.        Judgment: Judgment normal.       BP (!) 156/84   Pulse 89   Temp (!) 97.4 F (36.3 C) (Temporal)   Ht 5\' 4"  (1.626 m)   Wt 156 lb 9.6 oz (71 kg)   SpO2 97%   BMI 26.88 kg/m      Assessment & Plan:  Peggy Hall comes in today with chief complaint of Dysuria   Diagnosis and orders addressed:  1. Urinary frequency (Primary) Force fluids  Urine negative for infection today Can add cranberry juice Urine culture pending  Follow up if symptoms worsen or do not improve  - Urinalysis, Complete - Urine Culture    Jannifer Rodney, FNP

## 2023-09-15 NOTE — Patient Instructions (Signed)
 Urinary Frequency, Adult Urinary frequency means urinating more often than usual. You may urinate every 1-2 hours even though you drink a normal amount of fluid and do not have a bladder infection or condition. Although you urinate more often than normal, the total amount of urine produced in a day is normal. With urinary frequency, you may have an urgent need to urinate often. The stress and anxiety of needing to find a bathroom quickly can make this urge worse. This condition may go away on its own, or you may need treatment at home. Home treatment may include bladder training, exercises, taking medicines, or making changes to your diet. Follow these instructions at home: Bladder health Your health care provider will tell you what to do to improve bladder health. You may be told to: Keep a bladder diary. Keep track of: What you eat and drink. How often you urinate. How much you urinate. Follow a bladder training program. This may include: Learning to delay going to the bathroom. Double urinating, also called voiding. This helps if you are not completely emptying your bladder. Scheduled voiding. Do Kegel exercises. Kegel exercises strengthen the muscles that help control urination, which may help the condition.  Eating and drinking Follow instructions from your health care provider about eating or drinking restrictions. You may be told to: Avoid caffeine. Drink fewer fluids, especially alcohol. Avoid drinking in the evening. Avoid foods or drinks that may irritate the bladder. These include coffee, tea, soda, artificial sweeteners, citrus, tomato-based foods, and chocolate. Eat foods that help prevent or treat constipation. Constipation can make urinary frequency worse. You may need to take these actions to prevent or treat constipation: Drink enough fluid to keep your urine pale yellow. Take over-the-counter or prescription medicines. Eat foods that are high in fiber, such as beans, whole  grains, and fresh fruits and vegetables. Limit foods that are high in fat and processed sugars, such as fried or sweet foods. General instructions Take over-the-counter and prescription medicines only as told by your health care provider. Keep all follow-up visits. This is important. Contact a health care provider if: You start urinating more often. You feel pain or irritation when you urinate. You notice blood in your urine. Your urine looks cloudy. You develop a fever. You begin vomiting. Get help right away if: You are unable to urinate. Summary Urinary frequency means urinating more often than usual. With urinary frequency, you may urinate every 1-2 hours even though you drink a normal amount of fluid and do not have a bladder infection or other bladder condition. Your health care provider may recommend that you keep a bladder diary, follow a bladder training program, or make dietary changes. If told by your health care provider, do Kegel exercises to strengthen the muscles that help control urination. Take over-the-counter and prescription medicines only as told by your health care provider. Contact a health care provider if your symptoms do not improve or get worse. This information is not intended to replace advice given to you by your health care provider. Make sure you discuss any questions you have with your health care provider. Document Revised: 12/15/2019 Document Reviewed: 01/13/2020 Elsevier Patient Education  2024 [employer].

## 2023-09-18 ENCOUNTER — Other Ambulatory Visit: Payer: Self-pay | Admitting: Family

## 2023-09-18 MED ORDER — CEPHALEXIN 500 MG PO CAPS: 500.0000 mg | ORAL_CAPSULE | Freq: Two times a day (BID) | ORAL | 0 refills | Status: DC

## 2023-09-19 LAB — URINE CULTURE

## 2023-10-16 ENCOUNTER — Other Ambulatory Visit: Payer: Self-pay | Admitting: Family

## 2023-10-16 DIAGNOSIS — F32A Depression, unspecified: Secondary | ICD-10-CM

## 2023-10-16 DIAGNOSIS — F411 Generalized anxiety disorder: Secondary | ICD-10-CM

## 2023-10-16 DIAGNOSIS — G4700 Insomnia, unspecified: Secondary | ICD-10-CM

## 2023-10-19 ENCOUNTER — Ambulatory Visit: Payer: PRIVATE HEALTH INSURANCE | Admitting: Family

## 2023-10-19 ENCOUNTER — Encounter: Payer: Self-pay | Admitting: Family

## 2023-10-19 VITALS — BP 128/84 | HR 85 | Temp 97.9°F | Ht 64.0 in | Wt 157.4 lb

## 2023-10-19 DIAGNOSIS — K760 Fatty (change of) liver, not elsewhere classified: Secondary | ICD-10-CM

## 2023-10-19 DIAGNOSIS — M797 Fibromyalgia: Secondary | ICD-10-CM

## 2023-10-19 DIAGNOSIS — M159 Polyosteoarthritis, unspecified: Secondary | ICD-10-CM

## 2023-10-19 DIAGNOSIS — Z7985 Long-term (current) use of injectable non-insulin antidiabetic drugs: Secondary | ICD-10-CM

## 2023-10-19 DIAGNOSIS — F32A Depression, unspecified: Secondary | ICD-10-CM

## 2023-10-19 DIAGNOSIS — F411 Generalized anxiety disorder: Secondary | ICD-10-CM

## 2023-10-19 DIAGNOSIS — G4700 Insomnia, unspecified: Secondary | ICD-10-CM

## 2023-10-19 DIAGNOSIS — Z1231 Encounter for screening mammogram for malignant neoplasm of breast: Secondary | ICD-10-CM

## 2023-10-19 DIAGNOSIS — Z79899 Other long term (current) drug therapy: Secondary | ICD-10-CM

## 2023-10-19 DIAGNOSIS — I10 Essential (primary) hypertension: Secondary | ICD-10-CM

## 2023-10-19 DIAGNOSIS — E1169 Type 2 diabetes mellitus with other specified complication: Secondary | ICD-10-CM

## 2023-10-19 DIAGNOSIS — E785 Hyperlipidemia, unspecified: Secondary | ICD-10-CM

## 2023-10-19 LAB — CMP14+EGFR
ALT: 43 IU/L — ABNORMAL HIGH (ref 0–32)
AST: 22 IU/L (ref 0–40)
Albumin: 4.2 g/dL (ref 3.9–4.9)
Alkaline Phosphatase: 104 IU/L (ref 44–121)
BUN/Creatinine Ratio: 23 (ref 12–28)
BUN: 15 mg/dL (ref 8–27)
Bilirubin Total: 0.3 mg/dL (ref 0.0–1.2)
CO2: 26 mmol/L (ref 20–29)
Calcium: 9.7 mg/dL (ref 8.7–10.3)
Chloride: 104 mmol/L (ref 96–106)
Creatinine, Ser: 0.64 mg/dL (ref 0.57–1.00)
Globulin, Total: 2 g/dL (ref 1.5–4.5)
Glucose: 156 mg/dL — ABNORMAL HIGH (ref 70–99)
Potassium: 6 mmol/L (ref 3.5–5.2)
Sodium: 142 mmol/L (ref 134–144)
Total Protein: 6.2 g/dL (ref 6.0–8.5)
eGFR: 100 mL/min/{1.73_m2} (ref >59)

## 2023-10-19 LAB — BAYER DCA HB A1C WAIVED: HB A1C (BAYER DCA - WAIVED): 7.1 % — ABNORMAL HIGH (ref 4.8–5.6)

## 2023-10-19 LAB — CBC WITH DIFFERENTIAL/PLATELET
Basophils Absolute: 0 10*3/uL (ref 0.0–0.2)
Basos: 1 %
EOS (ABSOLUTE): 0.2 10*3/uL (ref 0.0–0.4)
Eos: 2 %
Hematocrit: 45.5 % (ref 34.0–46.6)
Hemoglobin: 14.7 g/dL (ref 11.1–15.9)
Immature Grans (Abs): 0.1 10*3/uL (ref 0.0–0.1)
Immature Granulocytes: 1 %
Lymphocytes Absolute: 1.9 10*3/uL (ref 0.7–3.1)
Lymphs: 22 %
MCH: 29.1 pg (ref 26.6–33.0)
MCHC: 32.3 g/dL (ref 31.5–35.7)
MCV: 90 fL (ref 79–97)
Monocytes Absolute: 0.6 10*3/uL (ref 0.1–0.9)
Monocytes: 7 %
Neutrophils Absolute: 5.7 10*3/uL (ref 1.4–7.0)
Neutrophils: 67 %
Platelets: 290 10*3/uL (ref 150–450)
RBC: 5.05 x10E6/uL (ref 3.77–5.28)
RDW: 12.1 % (ref 11.7–15.4)
WBC: 8.5 10*3/uL (ref 3.4–10.8)

## 2023-10-19 MED ORDER — ATORVASTATIN CALCIUM 10 MG PO TABS: 10.0000 mg | ORAL_TABLET | Freq: Every day | ORAL | 1 refills | Status: DC

## 2023-10-19 MED ORDER — DOXEPIN HCL 10 MG PO CAPS: 10.0000 mg | ORAL_CAPSULE | Freq: Every day | ORAL | 1 refills | Status: DC

## 2023-10-19 MED ORDER — DESVENLAFAXINE SUCCINATE ER 100 MG PO TB24: 100.0000 mg | ORAL_TABLET | Freq: Every day | ORAL | 3 refills | Status: DC

## 2023-10-19 MED ORDER — OZEMPIC (2 MG/DOSE) 8 MG/3ML ~~LOC~~ SOPN: 2.0000 mg | PEN_INJECTOR | SUBCUTANEOUS | 1 refills | Status: DC

## 2023-10-19 MED ORDER — DAPAGLIFLOZIN PRO-METFORMIN ER 10-1000 MG PO TB24: 2.0000 | ORAL_TABLET | Freq: Every day | ORAL | 1 refills | Status: DC

## 2023-10-19 MED ORDER — TRAMADOL HCL 50 MG PO TABS
50.0000 mg | ORAL_TABLET | Freq: Three times a day (TID) | ORAL | 2 refills | Status: DC | PRN
Start: 2023-10-19 — End: 2024-01-08

## 2023-10-19 MED ORDER — PROPRANOLOL HCL 20 MG PO TABS: 20.0000 mg | ORAL_TABLET | Freq: Two times a day (BID) | ORAL | 1 refills | Status: DC

## 2023-10-19 NOTE — Progress Notes (Signed)
 Subjective:    Patient ID: Peggy Hall, female    DOB: [birth year], 61 y.o.   MRN: [id number]  Chief Complaint  Patient presents with   Medical Management of Chronic Issues   Pt presents to the office today for chronic follow up. She is followed by Rheumatologists every 3 months for Fibromyalgia. She continues to have bilateral shoulders, knee, and back pain that has improved.    She has hepatic steatosis and followed by GI as needed.    Hypertension This is a chronic problem. The current episode started more than 1 year ago. The problem has been resolved since onset. The problem is controlled. Associated symptoms include anxiety and malaise/fatigue. Pertinent negatives include no blurred vision, peripheral edema or shortness of breath. Risk factors for coronary artery disease include dyslipidemia, diabetes mellitus, obesity and sedentary lifestyle. The current treatment provides moderate improvement.  Diabetes She presents for her follow-up diabetic visit. She has type 2 diabetes mellitus. Hypoglycemia symptoms include nervousness/anxiousness. Pertinent negatives for diabetes include no blurred vision and no foot paresthesias. Pertinent negatives for diabetic complications include no peripheral neuropathy. Risk factors for coronary artery disease include dyslipidemia, diabetes mellitus, hypertension, sedentary lifestyle and post-menopausal. She is following a generally unhealthy diet. Her overall blood glucose range is 140-180 mg/dl. Eye exam is not current.  Hyperlipidemia This is a chronic problem. The current episode started more than 1 year ago. The problem is controlled. Recent lipid tests were reviewed and are normal. Pertinent negatives include no shortness of breath. Current antihyperlipidemic treatment includes statins. The current treatment provides moderate improvement of lipids. Risk factors for coronary artery disease include dyslipidemia, diabetes mellitus, hypertension, a sedentary  lifestyle and post-menopausal.  Arthritis Presents for follow-up visit. She complains of pain and stiffness. Affected locations include the left knee, right knee, left MCP, right MCP, right shoulder, left shoulder, left hip and right hip. Her pain is at a severity of 8/10.  Insomnia Primary symptoms: sleep disturbance, difficulty falling asleep, malaise/fatigue.   The current episode started more than one year. The onset quality is gradual. The problem occurs intermittently. Past treatments include medication. The treatment provided moderate relief. PMH includes: depression.   Anxiety Presents for follow-up visit. Symptoms include decreased concentration, excessive worry, insomnia and nervous/anxious behavior. Patient reports no shortness of breath. Symptoms occur most days. The severity of symptoms is moderate.    Depression        This is a chronic problem.  The current episode started more than 1 year ago.   Associated symptoms include decreased concentration and insomnia.  Associated symptoms include no helplessness, no hopelessness and not sad.  Past treatments include SNRIs - Serotonin and norepinephrine reuptake inhibitors.  Past medical history includes anxiety.    Current opioids rx- Ultram  50 mg # meds rx- 61 Effectiveness of current meds-stable Adverse reactions from pain meds-none Morphine equivalent- 15  Pill count performed-No Last drug screen - 01/13/23 ( high risk q57m, moderate risk q79m, low risk yearly ) Urine drug screen today- No Was the [facility] reviewed- yes  If yes were their any concerning findings? - none   Pain contract signed on: 01/13/23     Review of Systems  Constitutional:  Positive for malaise/fatigue.  Eyes:  Negative for blurred vision.  Respiratory:  Negative for shortness of breath.   Musculoskeletal:  Positive for stiffness.  Psychiatric/Behavioral:  Positive for decreased concentration, depression and sleep disturbance. The patient is  nervous/anxious and has insomnia.   All other systems reviewed  and are negative.   Family History  Problem Relation Age of Onset   Stroke Mother    Heart disease Mother    Depression Mother    Dementia Mother    Stroke Father    Hypertension Father    Diabetes Father    Hyperlipidemia Father    Peripheral Artery Disease Father    Bipolar disorder Sister    Cancer Maternal Grandmother            Colon cancer Maternal Grandmother    Liver disease Neg Hx    Social History   Socioeconomic History   Marital status: Married    Spouse name: Not on file   Number of children: Not on file   Years of education: Not on file   Highest education level: 12th grade  Occupational History   Not on file  Tobacco Use   Smoking status: Former    Types: Cigarettes   Smokeless tobacco: Never  Vaping Use   Vaping status: Never Used  Substance and Sexual Activity   Alcohol use: No   Drug use: No   Sexual activity: Not on file  Other Topics Concern   Not on file  Social History Narrative   Not on file   Social Drivers of Health   Financial Resource Strain: Low Risk  (09/07/2023)   Received from [facility]   Overall Financial Resource Strain (CARDIA)    Difficulty of Paying Living Expenses: Not hard at all  Food Insecurity: No Food Insecurity (09/07/2023)   Received from [facility]   Hunger Vital Sign    Worried About Running Out of Food in the Last Year: Never true    Ran Out of Food in the Last Year: Never true  Transportation Needs: No Transportation Needs (09/07/2023)   Received from [facility] - Transportation    Lack of Transportation (Medical): No    Lack of Transportation (Non-Medical): No  Physical Activity: Unknown (09/07/2023)   Received from [facility]   Exercise Vital Sign    Days of Exercise per Week: 0 days    Minutes of Exercise per Session: Not on file  Stress: No Stress Concern Present (09/07/2023)   Received from [facility] - Occupational Stress Questionnaire    Feeling of Stress : Not at all  Recent Concern: Stress - Stress Concern Present (07/19/2023)   [employer] [facility] - Occupational Stress Questionnaire    Feeling of Stress : Rather much  Social Connections: Socially Integrated (09/07/2023)   Received from [facility]   Social Network    How would you rate your social network (family, work, friends)?: Good participation with social networks       Objective:   Physical Exam Vitals reviewed.  Constitutional:      General: She is not in acute distress.    Appearance: She is well-developed.  HENT:     Head: Normocephalic and atraumatic.     Right Ear: Tympanic membrane normal.     Left Ear: Tympanic membrane normal.  Eyes:     Pupils: Pupils are equal, round, and reactive to light.  Neck:     Thyroid : No thyromegaly.  Cardiovascular:     Rate and Rhythm: Normal rate and regular rhythm.     Heart sounds: Normal heart sounds. No murmur heard. Pulmonary:     Effort: Pulmonary effort is normal. No respiratory distress.     Breath sounds:  Normal breath sounds. No wheezing.  Abdominal:     General: Bowel sounds are normal. There is no distension.     Palpations: Abdomen is soft.     Tenderness: There is no abdominal tenderness.  Musculoskeletal:        General: No tenderness. Normal range of motion.     Cervical back: Normal range of motion and neck supple.  Skin:    General: Skin is warm and dry.  Neurological:     Mental Status: She is alert and oriented to person, place, and time.     Cranial Nerves: No cranial nerve deficit.     Deep Tendon Reflexes: Reflexes are normal and symmetric.  Psychiatric:        Behavior: Behavior normal.        Thought Content: Thought content normal.        Judgment: Judgment normal.       BP 128/84   Pulse 85   Temp 97.9 F (36.6 C) (Temporal)   Ht 5\' 4"  (1.626 m)   Wt 157 lb 6.4 oz (71.4 kg)    SpO2 100%   BMI 27.02 kg/m      Assessment & Plan:  Ahnyah Sabbath comes in today with chief complaint of Medical Management of Chronic Issues   Diagnosis and orders addressed:  1. Hyperlipidemia associated with type 2 diabetes mellitus (HCC) - atorvastatin  (LIPITOR) 10 MG tablet; Take 1 tablet (10 mg total) by mouth daily.  Dispense: 90 tablet; Refill: 1 - CBC with Differential/Platelet - CMP14+EGFR  2. Depression, unspecified depression type - desvenlafaxine  (PRISTIQ ) 100 MG 24 hr tablet; Take 1 tablet (100 mg total) by mouth daily.  Dispense: 90 tablet; Refill: 3 - CBC with Differential/Platelet - CMP14+EGFR - doxepin (SINEQUAN) 10 MG capsule; Take 1 capsule (10 mg total) by mouth at bedtime.  Dispense: 90 capsule; Refill: 1  3. GAD (generalized anxiety disorder) - Dapagliflozin  Pro-metFORMIN  ER (XIGDUO  XR) 03-999 MG TB24; Take 2 tablets by mouth daily.  Dispense: 180 tablet; Refill: 1 - desvenlafaxine  (PRISTIQ ) 100 MG 24 hr tablet; Take 1 tablet (100 mg total) by mouth daily.  Dispense: 90 tablet; Refill: 3 - propranolol  (INDERAL ) 20 MG tablet; Take 1 tablet (20 mg total) by mouth 2 (two) times daily.  Dispense: 180 tablet; Refill: 1 - CBC with Differential/Platelet - CMP14+EGFR  4. Insomnia, unspecified type Will start Doxepin 10 mg - desvenlafaxine  (PRISTIQ ) 100 MG 24 hr tablet; Take 1 tablet (100 mg total) by mouth daily.  Dispense: 90 tablet; Refill: 3 - CBC with Differential/Platelet - CMP14+EGFR - doxepin (SINEQUAN) 10 MG capsule; Take 1 capsule (10 mg total) by mouth at bedtime.  Dispense: 90 capsule; Refill: 1  5. Essential hypertension - propranolol  (INDERAL ) 20 MG tablet; Take 1 tablet (20 mg total) by mouth 2 (two) times daily.  Dispense: 180 tablet; Refill: 1 - CBC with Differential/Platelet - CMP14+EGFR  6. Type 2 diabetes mellitus with other specified complication, without long-term current use of insulin (HCC) (Primary) - CBC with Differential/Platelet -  Bayer DCA Hb A1c Waived - CMP14+EGFR - Semaglutide , 2 MG/DOSE, (OZEMPIC , 2 MG/DOSE,) 8 MG/3ML SOPN; Inject 2 mg into the skin once a week.  Dispense: 12 mL; Refill: 1  7. Osteoarthritis of multiple joints, unspecified osteoarthritis type  - traMADol  (ULTRAM ) 50 MG tablet; Take 1 tablet (50 mg total) by mouth every 8 (eight) hours as needed.  Dispense: 60 tablet; Refill: 2 - CBC with Differential/Platelet - CMP14+EGFR  8. Fibromyalgia - traMADol  (ULTRAM )  50 MG tablet; Take 1 tablet (50 mg total) by mouth every 8 (eight) hours as needed.  Dispense: 60 tablet; Refill: 2 - CBC with Differential/Platelet - CMP14+EGFR  9. Controlled substance agreement signed  - traMADol  (ULTRAM ) 50 MG tablet; Take 1 tablet (50 mg total) by mouth every 8 (eight) hours as needed.  Dispense: 60 tablet; Refill: 2 - CBC with Differential/Platelet - CMP14+EGFR  10. Hepatic steatosis  - CBC with Differential/Platelet - CMP14+EGFR  11. Encounter for screening mammogram for malignant neoplasm of breast - MM 3D SCREENING MAMMOGRAM BILATERAL BREAST; Future - CBC with Differential/Platelet - CMP14+EGFR    Labs pending Will increase Ozempic  to 2 mg from 1 mg  Will add Doxepin 10 mg  Sleep ritual  Patient reviewed in [place] controlled database, no flags noted. Contract and drug screen are up to date.  Health Maintenance reviewed Diet and exercise encouraged  Follow up plan: 3 months    Tommas Fragmin, FNP

## 2023-10-19 NOTE — Patient Instructions (Signed)
 Insomnia Insomnia is a sleep disorder that makes it difficult to fall asleep or stay asleep. Insomnia can cause fatigue, low energy, difficulty concentrating, mood swings, and poor performance at work or school. There are three different ways to classify insomnia: Difficulty falling asleep. Difficulty staying asleep. Waking up too early in the morning. Any type of insomnia can be long-term (chronic) or short-term (acute). Both are common. Short-term insomnia usually lasts for 3 months or less. Chronic insomnia occurs at least three times a week for longer than 3 months. What are the causes? Insomnia may be caused by another condition, situation, or substance, such as: Having certain mental health conditions, such as anxiety and depression. Using caffeine, alcohol, tobacco, or drugs. Having gastrointestinal conditions, such as gastroesophageal reflux disease (GERD). Having certain medical conditions. These include: Asthma. Alzheimer's disease. Stroke. Chronic pain. An overactive thyroid gland (hyperthyroidism). Other sleep disorders, such as restless legs syndrome and sleep apnea. Menopause. Sometimes, the cause of insomnia may not be known. What increases the risk? Risk factors for insomnia include: Gender. Females are affected more often than males. Age. Insomnia is more common as people get older. Stress and certain medical and mental health conditions. Lack of exercise. Having an irregular work schedule. This may include working night shifts and traveling between different time zones. What are the signs or symptoms? If you have insomnia, the main symptom is having trouble falling asleep or having trouble staying asleep. This may lead to other symptoms, such as: Feeling tired or having low energy. Feeling nervous about going to sleep. Not feeling rested in the morning. Having trouble concentrating. Feeling irritable, anxious, or depressed. How is this diagnosed? This condition  may be diagnosed based on: Your symptoms and medical history. Your health care provider may ask about: Your sleep habits. Any medical conditions you have. Your mental health. A physical exam. How is this treated? Treatment for insomnia depends on the cause. Treatment may focus on treating an underlying condition that is causing the insomnia. Treatment may also include: Medicines to help you sleep. Counseling or therapy. Lifestyle adjustments to help you sleep better. Follow these instructions at home: Eating and drinking  Limit or avoid alcohol, caffeinated beverages, and products that contain nicotine and tobacco, especially close to bedtime. These can disrupt your sleep. Do not eat a large meal or eat spicy foods right before bedtime. This can lead to digestive discomfort that can make it hard for you to sleep. Sleep habits  Keep a sleep diary to help you and your health care provider figure out what could be causing your insomnia. Write down: When you sleep. When you wake up during the night. How well you sleep and how rested you feel the next day. Any side effects of medicines you are taking. What you eat and drink. Make your bedroom a dark, comfortable place where it is easy to fall asleep. Put up shades or blackout curtains to block light from outside. Use a white noise machine to block noise. Keep the temperature cool. Limit screen use before bedtime. This includes: Not watching TV. Not using your smartphone, tablet, or computer. Stick to a routine that includes going to bed and waking up at the same times every day and night. This can help you fall asleep faster. Consider making a quiet activity, such as reading, part of your nighttime routine. Try to avoid taking naps during the day so that you sleep better at night. Get out of bed if you are still awake after  15 minutes of trying to sleep. Keep the lights down, but try reading or doing a quiet activity. When you feel  sleepy, go back to bed. General instructions Take over-the-counter and prescription medicines only as told by your health care provider. Exercise regularly as told by your health care provider. However, avoid exercising in the hours right before bedtime. Use relaxation techniques to manage stress. Ask your health care provider to suggest some techniques that may work well for you. These may include: Breathing exercises. Routines to release muscle tension. Visualizing peaceful scenes. Make sure that you drive carefully. Do not drive if you feel very sleepy. Keep all follow-up visits. This is important. Contact a health care provider if: You are tired throughout the day. You have trouble in your daily routine due to sleepiness. You continue to have sleep problems, or your sleep problems get worse. Get help right away if: You have thoughts about hurting yourself or someone else. Get help right away if you feel like you may hurt yourself or others, or have thoughts about taking your own life. Go to your nearest emergency room or: Call 911. Call the National Suicide Prevention Lifeline at (906)021-1611 or 988. This is open 24 hours a day. Text the Crisis Text Line at 325-069-2793. Summary Insomnia is a sleep disorder that makes it difficult to fall asleep or stay asleep. Insomnia can be long-term (chronic) or short-term (acute). Treatment for insomnia depends on the cause. Treatment may focus on treating an underlying condition that is causing the insomnia. Keep a sleep diary to help you and your health care provider figure out what could be causing your insomnia. This information is not intended to replace advice given to you by your health care provider. Make sure you discuss any questions you have with your health care provider. Document Revised: 05/20/2021 Document Reviewed: 05/20/2021 Elsevier Patient Education  2024 [employer].

## 2023-10-20 ENCOUNTER — Other Ambulatory Visit: Payer: Self-pay | Admitting: Family Medicine

## 2023-10-20 ENCOUNTER — Other Ambulatory Visit: Payer: Self-pay | Admitting: Family

## 2023-10-20 DIAGNOSIS — E875 Hyperkalemia: Secondary | ICD-10-CM

## 2023-10-22 ENCOUNTER — Encounter: Payer: Self-pay | Admitting: Family Medicine

## 2023-10-22 ENCOUNTER — Other Ambulatory Visit: Payer: PRIVATE HEALTH INSURANCE

## 2023-10-22 DIAGNOSIS — Z1231 Encounter for screening mammogram for malignant neoplasm of breast: Secondary | ICD-10-CM

## 2023-10-22 DIAGNOSIS — E875 Hyperkalemia: Secondary | ICD-10-CM

## 2023-10-23 ENCOUNTER — Telehealth: Payer: Self-pay | Admitting: Family Medicine

## 2023-10-23 ENCOUNTER — Other Ambulatory Visit: Payer: Self-pay

## 2023-10-23 DIAGNOSIS — E875 Hyperkalemia: Secondary | ICD-10-CM

## 2023-10-23 MED ORDER — FUROSEMIDE 20 MG PO TABS: 20.0000 mg | ORAL_TABLET | Freq: Every day | ORAL | 0 refills | Status: DC

## 2023-10-23 NOTE — Telephone Encounter (Signed)
Tried calling pt. No answer and vmail is full. ?

## 2023-10-23 NOTE — Addendum Note (Signed)
 Addended by: Francine Iron on: 10/23/2023 03:02 PM   Modules accepted: Orders

## 2023-10-23 NOTE — Telephone Encounter (Signed)
 Pt returned call. Made aware of Dr. Frankie Israel recommendations. Furosemide sent ot [facility] in [place]. Lab appt made for 5/7 at 9am.

## 2023-10-23 NOTE — Telephone Encounter (Signed)
 Patient's repeat potassium was still elevated.  Recommend for patient to take furosemide 20 mg daily and give her 4 days worth of it and have her come back early next week to have it rechecked.  Recheck a BMP

## 2023-10-24 LAB — BMP8+EGFR
BUN/Creatinine Ratio: 16 (ref 12–28)
BUN: 12 mg/dL (ref 8–27)
CO2: 27 mmol/L (ref 20–29)
Calcium: 10.3 mg/dL (ref 8.7–10.3)
Chloride: 102 mmol/L (ref 96–106)
Creatinine, Ser: 0.75 mg/dL (ref 0.57–1.00)
Glucose: 130 mg/dL — ABNORMAL HIGH (ref 70–99)
Potassium: 6.2 mmol/L (ref 3.5–5.2)
Sodium: 141 mmol/L (ref 134–144)
eGFR: 91 mL/min/{1.73_m2} (ref >59)

## 2023-10-26 ENCOUNTER — Telehealth: Payer: Self-pay

## 2023-10-28 ENCOUNTER — Other Ambulatory Visit: Payer: PRIVATE HEALTH INSURANCE

## 2023-10-28 DIAGNOSIS — E875 Hyperkalemia: Secondary | ICD-10-CM

## 2023-10-29 ENCOUNTER — Encounter: Payer: Self-pay | Admitting: Family Medicine

## 2023-10-29 ENCOUNTER — Telehealth: Payer: Self-pay | Admitting: Family

## 2023-10-29 LAB — BMP8+EGFR
BUN/Creatinine Ratio: 20 (ref 12–28)
BUN: 14 mg/dL (ref 8–27)
CO2: 23 mmol/L (ref 20–29)
Calcium: 9.7 mg/dL (ref 8.7–10.3)
Chloride: 101 mmol/L (ref 96–106)
Creatinine, Ser: 0.7 mg/dL (ref 0.57–1.00)
Glucose: 241 mg/dL — ABNORMAL HIGH (ref 70–99)
Potassium: 4.9 mmol/L (ref 3.5–5.2)
Sodium: 142 mmol/L (ref 134–144)
eGFR: 98 mL/min/{1.73_m2} (ref >59)

## 2023-10-29 NOTE — Telephone Encounter (Signed)
 Forms received, placed on PCP's desk

## 2023-10-29 NOTE — Telephone Encounter (Signed)
 Copied from CRM [id number]. Topic: General - Other >> Oct 29, 2023  2:26 PM Felizardo Hotter wrote: Reason for CRM: Received call from [employer] per Leary Provencal ph:[phone],faxed forms for pt assistant program. Please complete and fax.

## 2023-11-02 NOTE — Telephone Encounter (Signed)
 Labs redrawn and K+ normal

## 2023-11-06 NOTE — Telephone Encounter (Signed)
 Forms faxed back 11/05/23

## 2023-11-08 ENCOUNTER — Telehealth: Payer: Self-pay | Admitting: Family

## 2023-11-08 DIAGNOSIS — R399 Unspecified symptoms and signs involving the genitourinary system: Secondary | ICD-10-CM

## 2023-11-08 MED ORDER — CEPHALEXIN 500 MG PO CAPS: 500.0000 mg | ORAL_CAPSULE | Freq: Two times a day (BID) | ORAL | 0 refills | Status: DC

## 2023-11-08 NOTE — Progress Notes (Signed)

## 2023-11-09 ENCOUNTER — Telehealth: Payer: Self-pay

## 2023-11-09 NOTE — Telephone Encounter (Signed)
   For Ozempic  2mg  dose pens

## 2023-11-11 ENCOUNTER — Encounter: Payer: Self-pay | Admitting: Family

## 2023-11-24 ENCOUNTER — Telehealth: Payer: Self-pay

## 2023-11-24 NOTE — Telephone Encounter (Signed)
 ozempic  via patient assistance program arrive - patient notified to pick up

## 2023-12-15 NOTE — Telephone Encounter (Deleted)
 Emailed Ozempic  [employer] refill form to [employer] for completion.    (Patients enrollment ends 01/07/24)

## 2023-12-15 NOTE — Telephone Encounter (Signed)
 A user error has taken place: encounter opened in error, closed for administrative reasons.

## 2023-12-22 DIAGNOSIS — E1169 Type 2 diabetes mellitus with other specified complication: Secondary | ICD-10-CM

## 2023-12-22 DIAGNOSIS — F32A Depression, unspecified: Secondary | ICD-10-CM

## 2023-12-22 DIAGNOSIS — I10 Essential (primary) hypertension: Secondary | ICD-10-CM

## 2023-12-22 DIAGNOSIS — F411 Generalized anxiety disorder: Secondary | ICD-10-CM

## 2023-12-22 DIAGNOSIS — G4700 Insomnia, unspecified: Secondary | ICD-10-CM

## 2023-12-22 MED ORDER — DOXEPIN HCL 10 MG PO CAPS: 10.0000 mg | ORAL_CAPSULE | Freq: Every day | ORAL | 1 refills | Status: AC

## 2023-12-22 MED ORDER — DESVENLAFAXINE SUCCINATE ER 100 MG PO TB24: 100.0000 mg | ORAL_TABLET | Freq: Every day | ORAL | 3 refills | Status: DC

## 2023-12-22 MED ORDER — ATORVASTATIN CALCIUM 10 MG PO TABS: 10.0000 mg | ORAL_TABLET | Freq: Every day | ORAL | 1 refills | Status: DC

## 2023-12-22 MED ORDER — PROPRANOLOL HCL 20 MG PO TABS: 20.0000 mg | ORAL_TABLET | Freq: Two times a day (BID) | ORAL | 1 refills | Status: DC

## 2023-12-22 MED ORDER — OZEMPIC (2 MG/DOSE) 8 MG/3ML ~~LOC~~ SOPN: 2.0000 mg | PEN_INJECTOR | SUBCUTANEOUS | 1 refills | Status: AC

## 2023-12-22 MED ORDER — DAPAGLIFLOZIN PRO-METFORMIN ER 10-1000 MG PO TB24: 2.0000 | ORAL_TABLET | Freq: Every day | ORAL | 1 refills | Status: DC

## 2023-12-23 ENCOUNTER — Other Ambulatory Visit: Payer: Self-pay | Admitting: Family

## 2023-12-23 ENCOUNTER — Telehealth: Payer: Self-pay | Admitting: Family

## 2023-12-23 DIAGNOSIS — E1169 Type 2 diabetes mellitus with other specified complication: Secondary | ICD-10-CM

## 2023-12-23 DIAGNOSIS — F411 Generalized anxiety disorder: Secondary | ICD-10-CM

## 2023-12-23 DIAGNOSIS — F32A Depression, unspecified: Secondary | ICD-10-CM

## 2023-12-23 DIAGNOSIS — G4700 Insomnia, unspecified: Secondary | ICD-10-CM

## 2023-12-23 NOTE — Telephone Encounter (Signed)
 Spoke with Clarita. She is aware 10 business days for form to be completed.  Copied from CRM [id number]. Topic: Medical Record Request - Other >> Dec 23, 2023  9:13 AM Myrick T wrote: Reason for CRM: Clarita called stated she need the form completed and sent back for patient re-enrollment assistance program. Please f/u with Clarita at [phone]

## 2023-12-30 ENCOUNTER — Telehealth: Payer: Self-pay

## 2023-12-30 ENCOUNTER — Other Ambulatory Visit (HOSPITAL_COMMUNITY): Payer: Self-pay

## 2023-12-30 NOTE — Progress Notes (Unsigned)
 Pharmacy Medication Assistance Program Note    12/30/2023  Patient ID: Peggy Hall, female   DOB: [birth year], 63 y.o.   MRN: [id number]     12/30/2023  [employer]  [employer]  [employer] [facility] Other Music therapist Drug  Type of Radiographer, therapeutic Assistance  Name of Prescriber Bari Learn  Patient Assistance Determination Approved  Approval Start Date 01/09/2024  Approval End Date 01/08/2025   XIGDUO  XR 5-1000MG   Rec'd approval fax from company. Called to confirm medication and strength. Dose on patients chart is 10-1000MG . If incorrect, please send any med changes to [facility].   I believe outside help is assisting her with PAP.

## 2024-01-08 ENCOUNTER — Telehealth: Payer: Self-pay

## 2024-01-08 DIAGNOSIS — M797 Fibromyalgia: Secondary | ICD-10-CM

## 2024-01-08 DIAGNOSIS — Z79899 Other long term (current) drug therapy: Secondary | ICD-10-CM

## 2024-01-08 DIAGNOSIS — M159 Polyosteoarthritis, unspecified: Secondary | ICD-10-CM

## 2024-01-08 MED ORDER — TRAMADOL HCL 50 MG PO TABS: 50.0000 mg | ORAL_TABLET | Freq: Three times a day (TID) | ORAL | 2 refills | Status: DC | PRN

## 2024-01-08 NOTE — Telephone Encounter (Signed)
 Copied from CRM [id number]. Topic: Clinical - Prescription Issue >> Jan 08, 2024  2:43 PM Santiya F wrote: Reason for CRM: Arlyne with [facility] is calling in because they received a request to increase the quantity of patient's traMADol  (ULTRAM ) 50 MG tablet [[id number]] to a 30 day supply and they need permission from the provider before they can do that.

## 2024-01-08 NOTE — Addendum Note (Signed)
 Addended by: LAVELL LYE A on: 01/08/2024 04:16 PM   Modules accepted: Orders

## 2024-01-08 NOTE — Telephone Encounter (Signed)
 Prescription sent to pharmacy.

## 2024-01-13 ENCOUNTER — Telehealth: Payer: Self-pay

## 2024-01-13 DIAGNOSIS — F411 Generalized anxiety disorder: Secondary | ICD-10-CM

## 2024-01-13 NOTE — Telephone Encounter (Signed)
   Letter scanned to patients media.

## 2024-01-13 NOTE — Telephone Encounter (Signed)
 Received a refill request fax from [employer] patient assistance company for patients XIGDUO  medication.   Please send a 90 day supply (with refills) to [facility] if applicable. Thanks!

## 2024-01-14 MED ORDER — DAPAGLIFLOZIN PRO-METFORMIN ER 10-1000 MG PO TB24: 1.0000 | ORAL_TABLET | Freq: Every day | ORAL | 4 refills | Status: AC

## 2024-01-14 NOTE — Telephone Encounter (Signed)
 done

## 2024-02-02 ENCOUNTER — Encounter: Payer: Self-pay | Admitting: Family

## 2024-02-02 ENCOUNTER — Ambulatory Visit (INDEPENDENT_AMBULATORY_CARE_PROVIDER_SITE_OTHER): Payer: PRIVATE HEALTH INSURANCE | Admitting: Family

## 2024-02-02 VITALS — BP 131/84 | HR 80 | Temp 97.3°F | Ht 64.0 in | Wt 150.2 lb

## 2024-02-02 DIAGNOSIS — F411 Generalized anxiety disorder: Secondary | ICD-10-CM

## 2024-02-02 DIAGNOSIS — F325 Major depressive disorder, single episode, in full remission: Secondary | ICD-10-CM

## 2024-02-02 DIAGNOSIS — M159 Polyosteoarthritis, unspecified: Secondary | ICD-10-CM

## 2024-02-02 DIAGNOSIS — M797 Fibromyalgia: Secondary | ICD-10-CM

## 2024-02-02 DIAGNOSIS — G4700 Insomnia, unspecified: Secondary | ICD-10-CM

## 2024-02-02 DIAGNOSIS — I10 Essential (primary) hypertension: Secondary | ICD-10-CM | POA: Diagnosis not present

## 2024-02-02 DIAGNOSIS — E785 Hyperlipidemia, unspecified: Secondary | ICD-10-CM

## 2024-02-02 DIAGNOSIS — E1169 Type 2 diabetes mellitus with other specified complication: Secondary | ICD-10-CM

## 2024-02-02 DIAGNOSIS — Z23 Encounter for immunization: Secondary | ICD-10-CM

## 2024-02-02 DIAGNOSIS — Z79899 Other long term (current) drug therapy: Secondary | ICD-10-CM

## 2024-02-02 MED ORDER — TRAMADOL HCL 50 MG PO TABS: 50.0000 mg | ORAL_TABLET | Freq: Three times a day (TID) | ORAL | 2 refills | Status: DC | PRN

## 2024-02-02 MED ORDER — PROPRANOLOL HCL 20 MG PO TABS
20.0000 mg | ORAL_TABLET | Freq: Two times a day (BID) | ORAL | 1 refills | Status: AC
Start: 2024-02-02 — End: ?

## 2024-02-02 NOTE — Patient Instructions (Signed)

## 2024-02-02 NOTE — Progress Notes (Signed)
 Subjective:    Patient ID: Peggy Hall, female    DOB: 04/23/1961, 63 y.o.   MRN: [id number]  Chief Complaint  Patient presents with   Medical Management of Chronic Issues   Pt presents to the office today for chronic follow up. She is followed by Rheumatologists every 3 months for Fibromyalgia. She continues to have bilateral shoulders, knee, and back pain that has improved.    She has hepatic steatosis and followed by GI as needed.    Hypertension This is a chronic problem. The current episode started more than 1 year ago. The problem has been resolved since onset. The problem is controlled. Associated symptoms include anxiety and malaise/fatigue. Pertinent negatives include no blurred vision, peripheral edema or shortness of breath. Risk factors for coronary artery disease include dyslipidemia, diabetes mellitus and sedentary lifestyle. The current treatment provides moderate improvement.  Diabetes She presents for her follow-up diabetic visit. She has type 2 diabetes mellitus. Hypoglycemia symptoms include nervousness/anxiousness. Pertinent negatives for diabetes include no blurred vision and no foot paresthesias. Pertinent negatives for diabetic complications include no peripheral neuropathy. Risk factors for coronary artery disease include dyslipidemia, diabetes mellitus, hypertension, sedentary lifestyle and post-menopausal. She is following a generally healthy diet. Her overall blood glucose range is 110-130 mg/dl. Eye exam is current.  Hyperlipidemia This is a chronic problem. The current episode started more than 1 year ago. The problem is controlled. Recent lipid tests were reviewed and are normal. Pertinent negatives include no shortness of breath. Current antihyperlipidemic treatment includes statins. The current treatment provides moderate improvement of lipids. Risk factors for coronary artery disease include dyslipidemia, diabetes mellitus, hypertension, a sedentary lifestyle and  post-menopausal.  Arthritis Presents for follow-up visit. She complains of pain and stiffness. Affected locations include the left knee, right knee, left MCP, right MCP, right shoulder, left shoulder, left hip and right hip. Her pain is at a severity of 8/10.  Insomnia Primary symptoms: sleep disturbance, difficulty falling asleep, malaise/fatigue.   The current episode started more than one year. The onset quality is gradual. The problem occurs intermittently. Past treatments include medication. The treatment provided moderate relief. PMH includes: depression.   Anxiety Presents for follow-up visit. Symptoms include decreased concentration, excessive worry, insomnia and nervous/anxious behavior. Patient reports no shortness of breath. Symptoms occur most days. The severity of symptoms is moderate.    Depression        This is a chronic problem.  The current episode started more than 1 year ago.   Associated symptoms include decreased concentration, helplessness, hopelessness, insomnia and sad.  Past treatments include SNRIs - Serotonin and norepinephrine reuptake inhibitors.  Past medical history includes anxiety.    Current opioids rx- Ultram  50 mg # meds rx- 60 Effectiveness of current meds-stable Adverse reactions from pain meds-none Morphine equivalent- 15  Pill count performed-No Last drug screen - 02/02/24 ( high risk q74m, moderate risk q39m, low risk yearly ) Urine drug screen today- No Was the NCCSR reviewed- yes  If yes were their any concerning findings? - none   Pain contract signed on: 02/02/24     Review of Systems  Constitutional:  Positive for malaise/fatigue.  Eyes:  Negative for blurred vision.  Respiratory:  Negative for shortness of breath.   Musculoskeletal:  Positive for stiffness.  Psychiatric/Behavioral:  Positive for decreased concentration, depression and sleep disturbance. The patient is nervous/anxious and has insomnia.   All other systems reviewed and  are negative.   Family History  Problem Relation  Age of Onset   Stroke Mother    Heart disease Mother    Depression Mother    Dementia Mother    Stroke Father    Hypertension Father    Diabetes Father    Hyperlipidemia Father    Peripheral Artery Disease Father    Bipolar disorder Sister    Cancer Maternal Grandmother            Colon cancer Maternal Grandmother    Liver disease Neg Hx    Social History   Socioeconomic History   Marital status: Married    Spouse name: Not on file   Number of children: Not on file   Years of education: Not on file   Highest education level: 12th grade  Occupational History   Not on file  Tobacco Use   Smoking status: Former    Types: Cigarettes   Smokeless tobacco: Never  Vaping Use   Vaping status: Never Used  Substance and Sexual Activity   Alcohol use: No   Drug use: No   Sexual activity: Not on file  Other Topics Concern   Not on file  Social History Narrative   Not on file   Social Drivers of Health   Financial Resource Strain: Low Risk  (02/01/2024)   Overall Financial Resource Strain (CARDIA)    Difficulty of Paying Living Expenses: Not hard at all  Food Insecurity: No Food Insecurity (02/01/2024)   Hunger Vital Sign    Worried About Running Out of Food in the Last Year: Never true    Ran Out of Food in the Last Year: Never true  Transportation Needs: No Transportation Needs (02/01/2024)   PRAPARE - Administrator, Civil Service (Medical): No    Lack of Transportation (Non-Medical): No  Physical Activity: Inactive (02/01/2024)   Exercise Vital Sign    Days of Exercise per Week: 0 days    Minutes of Exercise per Session: Not on file  Stress: No Stress Concern Present (09/07/2023)   Received from [facility]ational [facility] - Occupational Stress Questionnaire    Feeling of Stress : Not at all  Recent Concern: Stress - Stress Concern Present (07/19/2023)   [employer] of  Occupational Health - Occupational Stress Questionnaire    Feeling of Stress : Rather much  Social Connections: Unknown (02/01/2024)   Social Connection and Isolation Panel    Frequency of Communication with Friends and Family: Not on file    Frequency of Social Gatherings with Friends and Family: Not on file    Attends Religious Services: Not on file    Active Member of Clubs or Organizations: No    Attends Banker Meetings: Not on file    Marital Status: Married       Objective:   Physical Exam Vitals reviewed.  Constitutional:      General: She is not in acute distress.    Appearance: She is well-developed.  HENT:     Head: Normocephalic and atraumatic.     Right Ear: Tympanic membrane normal.     Left Ear: Tympanic membrane normal.  Eyes:     Pupils: Pupils are equal, round, and reactive to light.  Neck:     Thyroid : No thyromegaly.  Cardiovascular:     Rate and Rhythm: Normal rate and regular rhythm.     Heart sounds: Normal heart sounds. No murmur heard. Pulmonary:     Effort: Pulmonary effort is normal. No respiratory  distress.     Breath sounds: Normal breath sounds. No wheezing.  Abdominal:     General: Bowel sounds are normal. There is no distension.     Palpations: Abdomen is soft.     Tenderness: There is no abdominal tenderness.  Musculoskeletal:        General: No tenderness. Normal range of motion.     Cervical back: Normal range of motion and neck supple.  Skin:    General: Skin is warm and dry.  Neurological:     Mental Status: She is alert and oriented to person, place, and time.     Cranial Nerves: No cranial nerve deficit.     Deep Tendon Reflexes: Reflexes are normal and symmetric.  Psychiatric:        Behavior: Behavior normal.        Thought Content: Thought content normal.        Judgment: Judgment normal.    Diabetic Foot Exam - Simple   Simple Foot Form Diabetic Foot exam was performed with the following findings: Yes  02/02/2024  9:46 AM  Visual Inspection No deformities, no ulcerations, no other skin breakdown bilaterally: Yes Sensation Testing Intact to touch and monofilament testing bilaterally: Yes Pulse Check Posterior Tibialis and Dorsalis pulse intact bilaterally: Yes Comments       BP 131/84   Pulse 80   Temp (!) 97.3 F (36.3 C) (Temporal)   Ht 5' 4 (1.626 m)   Wt 150 lb 3.2 oz (68.1 kg)   SpO2 97%   BMI 25.78 kg/m      Assessment & Plan:  Nayzeth Altman comes in today with chief complaint of Medical Management of Chronic Issues   Diagnosis and orders addressed:  1. Immunization due (Primary) - Pneumococcal conjugate vaccine 20-valent (Prevnar 20) - CMP14+EGFR  2. Essential hypertension - propranolol  (INDERAL ) 20 MG tablet; Take 1 tablet (20 mg total) by mouth 2 (two) times daily.  Dispense: 180 tablet; Refill: 1 - CMP14+EGFR  3. Major depressive disorder with single episode, in full remission (HCC) - CMP14+EGFR  4. Type 2 diabetes mellitus with other specified complication, without long-term current use of insulin (HCC)  - CMP14+EGFR - Bayer DCA Hb A1c Waived  5. Hyperlipidemia associated with type 2 diabetes mellitus (HCC) - CMP14+EGFR  6. GAD (generalized anxiety disorder) - propranolol  (INDERAL ) 20 MG tablet; Take 1 tablet (20 mg total) by mouth 2 (two) times daily.  Dispense: 180 tablet; Refill: 1 - CMP14+EGFR  7. Insomnia, unspecified type - CMP14+EGFR  8. Osteoarthritis of multiple joints, unspecified osteoarthritis type - ToxASSURE Select 13 (MW), Urine - traMADol  (ULTRAM ) 50 MG tablet; Take 1 tablet (50 mg total) by mouth every 8 (eight) hours as needed.  Dispense: 90 tablet; Refill: 2 - CMP14+EGFR  9. Fibromyalgia - ToxASSURE Select 13 (MW), Urine - traMADol  (ULTRAM ) 50 MG tablet; Take 1 tablet (50 mg total) by mouth every 8 (eight) hours as needed.  Dispense: 90 tablet; Refill: 2 - CMP14+EGFR  10. Controlled substance agreement signed - ToxASSURE  Select 13 (MW), Urine - traMADol  (ULTRAM ) 50 MG tablet; Take 1 tablet (50 mg total) by mouth every 8 (eight) hours as needed.  Dispense: 90 tablet; Refill: 2 - CMP14+EGFR   Labs pending Continue current medications  Patient reviewed in  controlled database, no flags noted. Contract and drug screen are up to date.  Health Maintenance reviewed Diet and exercise encouraged  Follow up plan: 3 months    Bari Learn, FNP

## 2024-02-03 ENCOUNTER — Other Ambulatory Visit: Payer: PRIVATE HEALTH INSURANCE

## 2024-02-03 LAB — CMP14+EGFR
ALT: 41 IU/L — ABNORMAL HIGH (ref 0–32)
AST: 19 IU/L (ref 0–40)
Albumin: 4.5 g/dL (ref 3.9–4.9)
Alkaline Phosphatase: 104 IU/L (ref 44–121)
BUN/Creatinine Ratio: 12 (ref 12–28)
BUN: 9 mg/dL (ref 8–27)
Bilirubin Total: 0.6 mg/dL (ref 0.0–1.2)
CO2: 24 mmol/L (ref 20–29)
Calcium: 9.8 mg/dL (ref 8.7–10.3)
Chloride: 100 mmol/L (ref 96–106)
Creatinine, Ser: 0.73 mg/dL (ref 0.57–1.00)
Globulin, Total: 1.9 g/dL (ref 1.5–4.5)
Glucose: 136 mg/dL — ABNORMAL HIGH (ref 70–99)
Potassium: 5.4 mmol/L — ABNORMAL HIGH (ref 3.5–5.2)
Sodium: 139 mmol/L (ref 134–144)
Total Protein: 6.4 g/dL (ref 6.0–8.5)
eGFR: 94 mL/min/1.73 (ref >59)

## 2024-02-03 LAB — BAYER DCA HB A1C WAIVED: HB A1C (BAYER DCA - WAIVED): 6.6 % — ABNORMAL HIGH (ref 4.8–5.6)

## 2024-02-04 ENCOUNTER — Ambulatory Visit: Payer: Self-pay | Admitting: Family

## 2024-02-04 LAB — TOXASSURE SELECT 13 (MW), URINE

## 2024-02-18 ENCOUNTER — Telehealth: Payer: Self-pay | Admitting: *Deleted

## 2024-02-18 NOTE — Telephone Encounter (Signed)
 PATIENTS MEDICATION ASSISTANCE OZEMPIC  AVAILABLE FOR PICK UP  CALLED PATIENT, NO ANSWER, NO OPTION TO LEAVE VOICE MESSAGE

## 2024-02-19 NOTE — Telephone Encounter (Signed)
 2nd attempt to call patient to let her know that her Ozempic  order is ready to be picked up here at the office.

## 2024-03-18 ENCOUNTER — Telehealth: Payer: Self-pay | Admitting: Physician Assistant

## 2024-03-18 DIAGNOSIS — B9689 Other specified bacterial agents as the cause of diseases classified elsewhere: Secondary | ICD-10-CM

## 2024-03-18 DIAGNOSIS — J028 Acute pharyngitis due to other specified organisms: Secondary | ICD-10-CM

## 2024-03-18 DIAGNOSIS — R051 Acute cough: Secondary | ICD-10-CM

## 2024-03-18 MED ORDER — PROMETHAZINE-DM 6.25-15 MG/5ML PO SYRP: 5.0000 mL | ORAL_SOLUTION | Freq: Four times a day (QID) | ORAL | 0 refills | Status: DC | PRN

## 2024-03-18 MED ORDER — AZITHROMYCIN 250 MG PO TABS: ORAL_TABLET | ORAL | 0 refills | Status: AC

## 2024-03-18 NOTE — Progress Notes (Signed)
 E-Visit for Sore Throat   We are sorry that you are not feeling well.  Here is how we plan to help!  Based on what you have shared with me it is likely that you have bacterial pharyngitis.  Bacterial pharyngitis is inflammation and infection in the back of the throat.  This is an infection cause by bacteria and is treated with antibiotics.  I have prescribed Azithromycin  250 mg two tablets today and then one daily for 4 additional days and I have refilled the Promethazine  DM cough syrup. For throat pain, we recommend over the counter oral pain relief medications such as acetaminophen or aspirin, or anti-inflammatory medications such as ibuprofen or naproxen sodium. Topical treatments such as oral throat lozenges or sprays may be used as needed. Bacterial pharyngitis infections are not as easily transmitted as other respiratory infections, however we still recommend that you avoid close contact with loved ones, especially the very young and elderly.  Remember to wash your hands thoroughly throughout the day as this is the number one way to prevent the spread of infection and wipe down door knobs and counters with disinfectant.  Home Care: Only take medications as instructed by your medical team. Complete the entire course of an antibiotic. Do not take these medications with alcohol. A steam or ultrasonic humidifier can help congestion.  You can place a towel over your head and breathe in the steam from hot water coming from a faucet. Avoid close contacts especially the very young and the elderly. Cover your mouth when you cough or sneeze. Always remember to wash your hands.  Get Help Right Away If: You develop worsening fever or sinus pain. You develop a severe head ache or visual changes. Your symptoms persist after you have completed your treatment plan.  Make sure you Understand these instructions. Will watch your condition. Will get help right away if you are not doing well or get  worse.   Thank you for choosing an e-visit.  Your e-visit answers were reviewed by a board certified advanced clinical practitioner to complete your personal care plan. Depending upon the condition, your plan could have included both over the counter or prescription medications.  Please review your pharmacy choice. Make sure the pharmacy is open so you can pick up prescription now. If there is a problem, you may contact your provider through [employer] and have the prescription routed to another pharmacy.  Your safety is important to us . If you have drug allergies check your prescription carefully.   For the next 24 hours you can use MyChart to ask questions about today's visit, request a non-urgent call back, or ask for a work or school excuse. You will get an email in the next two days asking about your experience. I hope that your e-visit has been valuable and will speed your recovery.    I have spent 5 minutes in review of e-visit questionnaire, review and updating patient chart, medical decision making and response to patient.   Delon CHRISTELLA Dickinson, PA-C

## 2024-05-05 ENCOUNTER — Ambulatory Visit: Payer: PRIVATE HEALTH INSURANCE | Admitting: Family

## 2024-05-05 ENCOUNTER — Encounter: Payer: Self-pay | Admitting: Family

## 2024-05-05 VITALS — BP 123/72 | HR 83 | Temp 97.4°F | Ht 64.0 in | Wt 153.6 lb

## 2024-05-05 DIAGNOSIS — I10 Essential (primary) hypertension: Secondary | ICD-10-CM

## 2024-05-05 DIAGNOSIS — K760 Fatty (change of) liver, not elsewhere classified: Secondary | ICD-10-CM

## 2024-05-05 DIAGNOSIS — E785 Hyperlipidemia, unspecified: Secondary | ICD-10-CM

## 2024-05-05 DIAGNOSIS — G4700 Insomnia, unspecified: Secondary | ICD-10-CM

## 2024-05-05 DIAGNOSIS — Z79899 Other long term (current) drug therapy: Secondary | ICD-10-CM

## 2024-05-05 DIAGNOSIS — F411 Generalized anxiety disorder: Secondary | ICD-10-CM

## 2024-05-05 DIAGNOSIS — F325 Major depressive disorder, single episode, in full remission: Secondary | ICD-10-CM

## 2024-05-05 DIAGNOSIS — M797 Fibromyalgia: Secondary | ICD-10-CM

## 2024-05-05 DIAGNOSIS — E1169 Type 2 diabetes mellitus with other specified complication: Secondary | ICD-10-CM

## 2024-05-05 DIAGNOSIS — M159 Polyosteoarthritis, unspecified: Secondary | ICD-10-CM

## 2024-05-05 MED ORDER — TRAMADOL HCL 50 MG PO TABS
50.0000 mg | ORAL_TABLET | Freq: Three times a day (TID) | ORAL | 2 refills | Status: AC | PRN
Start: 2024-05-05 — End: ?

## 2024-05-05 NOTE — Progress Notes (Signed)
 Subjective:    Patient ID: Peggy Hall, female    DOB: Oct 22, 1960, 63 y.o.   MRN: [id number]  Chief Complaint  Patient presents with   Medical Management of Chronic Issues   Pt presents to the office today for chronic follow up.   She is followed by Rheumatologists as needed Fibromyalgia. She continues to have bilateral shoulders, knee, and back pain that has improved.    She has hepatic steatosis and followed by GI as needed.    Hypertension This is a chronic problem. The current episode started more than 1 year ago. The problem has been resolved since onset. The problem is controlled. Associated symptoms include anxiety and malaise/fatigue. Pertinent negatives include no blurred vision, peripheral edema or shortness of breath. Risk factors for coronary artery disease include dyslipidemia, diabetes mellitus, sedentary lifestyle and post-menopausal state. The current treatment provides moderate improvement.  Diabetes She presents for her follow-up diabetic visit. She has type 2 diabetes mellitus. Hypoglycemia symptoms include nervousness/anxiousness. Pertinent negatives for diabetes include no blurred vision and no foot paresthesias. Pertinent negatives for diabetic complications include no peripheral neuropathy. Risk factors for coronary artery disease include dyslipidemia, diabetes mellitus, hypertension, sedentary lifestyle and post-menopausal. She is following a generally healthy diet. Her overall blood glucose range is 110-130 mg/dl. Eye exam is current.  Hyperlipidemia This is a chronic problem. The current episode started more than 1 year ago. The problem is controlled. Recent lipid tests were reviewed and are normal. Pertinent negatives include no shortness of breath. Current antihyperlipidemic treatment includes statins. The current treatment provides moderate improvement of lipids. Risk factors for coronary artery disease include dyslipidemia, diabetes mellitus, hypertension, a  sedentary lifestyle and post-menopausal.  Arthritis Presents for follow-up visit. She complains of pain and stiffness. Affected locations include the left knee, right knee, left MCP, right MCP, right shoulder, left shoulder, left hip and right hip. Her pain is at a severity of 6/10.  Insomnia Primary symptoms: sleep disturbance, difficulty falling asleep, malaise/fatigue.   The current episode started more than one year. The onset quality is gradual. The problem occurs intermittently. Past treatments include medication. The treatment provided moderate relief. PMH includes: depression.   Anxiety Presents for follow-up visit. Symptoms include decreased concentration, excessive worry, insomnia and nervous/anxious behavior. Patient reports no shortness of breath. Symptoms occur rarely. The severity of symptoms is moderate.    Depression        This is a chronic problem.  The current episode started more than 1 year ago.   Associated symptoms include decreased concentration, insomnia and sad.  Associated symptoms include no helplessness and no hopelessness.  Past treatments include SNRIs - Serotonin and norepinephrine reuptake inhibitors.  Past medical history includes anxiety.    Current opioids rx- Ultram  50 mg # meds rx- 60 Effectiveness of current meds-stable Adverse reactions from pain meds-none Morphine equivalent- 15  Pill count performed-No Last drug screen - 02/02/24 ( high risk q2m, moderate risk q59m, low risk yearly ) Urine drug screen today- No Was the NCCSR reviewed- yes  If yes were their any concerning findings? - none   Pain contract signed on: 02/02/24     Review of Systems  Constitutional:  Positive for malaise/fatigue.  Eyes:  Negative for blurred vision.  Respiratory:  Negative for shortness of breath.   Musculoskeletal:  Positive for stiffness.  Psychiatric/Behavioral:  Positive for decreased concentration, depression and sleep disturbance. The patient is  nervous/anxious and has insomnia.   All other systems reviewed and are  negative.   Family History  Problem Relation Age of Onset   Stroke Mother    Heart disease Mother    Depression Mother    Dementia Mother    Stroke Father    Hypertension Father    Diabetes Father    Hyperlipidemia Father    Peripheral Artery Disease Father    Bipolar disorder Sister    Cancer Maternal Grandmother            Colon cancer Maternal Grandmother    Liver disease Neg Hx    Social History   Socioeconomic History   Marital status: Married    Spouse name: Not on file   Number of children: Not on file   Years of education: Not on file   Highest education level: 12th grade  Occupational History   Not on file  Tobacco Use   Smoking status: Former    Types: Cigarettes   Smokeless tobacco: Never  Vaping Use   Vaping status: Never Used  Substance and Sexual Activity   Alcohol use: No   Drug use: No   Sexual activity: Not on file  Other Topics Concern   Not on file  Social History Narrative   Not on file   Social Drivers of Health   Financial Resource Strain: Low Risk  (05/02/2024)   Overall Financial Resource Strain (CARDIA)    Difficulty of Paying Living Expenses: Not hard at all  Food Insecurity: No Food Insecurity (05/02/2024)   Hunger Vital Sign    Worried About Running Out of Food in the Last Year: Never true    Ran Out of Food in the Last Year: Never true  Transportation Needs: No Transportation Needs (05/02/2024)   PRAPARE - Administrator, Civil Service (Medical): No    Lack of Transportation (Non-Medical): No  Physical Activity: Inactive (05/02/2024)   Exercise Vital Sign    Days of Exercise per Week: 0 days    Minutes of Exercise per Session: Not on file  Stress: Stress Concern Present (05/02/2024)   [employer] of Occupational Health - Occupational Stress Questionnaire    Feeling of Stress: To some extent  Social Connections: Moderately Integrated  (05/02/2024)   Social Connection and Isolation Panel    Frequency of Communication with Friends and Family: More than three times a week    Frequency of Social Gatherings with Friends and Family: Once a week    Attends Religious Services: More than 4 times per year    Active Member of [employer] or Organizations: No    Attends Engineer, Structural: Not on file    Marital Status: Married       Objective:   Physical Exam Vitals reviewed.  Constitutional:      General: She is not in acute distress.    Appearance: She is well-developed.  HENT:     Head: Normocephalic and atraumatic.     Right Ear: Tympanic membrane normal.     Left Ear: Tympanic membrane normal.  Eyes:     Pupils: Pupils are equal, round, and reactive to light.  Neck:     Thyroid : No thyromegaly.  Cardiovascular:     Rate and Rhythm: Normal rate and regular rhythm.     Heart sounds: Normal heart sounds. No murmur heard. Pulmonary:     Effort: Pulmonary effort is normal. No respiratory distress.     Breath sounds: Normal breath sounds. No wheezing.  Abdominal:     General: Bowel sounds are  normal. There is no distension.     Palpations: Abdomen is soft.     Tenderness: There is no abdominal tenderness.  Musculoskeletal:        General: No tenderness. Normal range of motion.     Cervical back: Normal range of motion and neck supple.  Skin:    General: Skin is warm and dry.  Neurological:     Mental Status: She is alert and oriented to person, place, and time.     Cranial Nerves: No cranial nerve deficit.     Deep Tendon Reflexes: Reflexes are normal and symmetric.  Psychiatric:        Behavior: Behavior normal.        Thought Content: Thought content normal.        Judgment: Judgment normal.        BP 123/72   Pulse 83   Temp (!) 97.4 F (36.3 C) (Temporal)   Ht 5' 4 (1.626 m)   Wt 153 lb 9.6 oz (69.7 kg)   SpO2 96%   BMI 26.37 kg/m      Assessment & Plan:  Chemeka Filice comes in today  with chief complaint of Medical Management of Chronic Issues   Diagnosis and orders addressed:  1. Osteoarthritis of multiple joints, unspecified osteoarthritis type - traMADol  (ULTRAM ) 50 MG tablet; Take 1 tablet (50 mg total) by mouth every 8 (eight) hours as needed.  Dispense: 90 tablet; Refill: 2  2. Fibromyalgia - traMADol  (ULTRAM ) 50 MG tablet; Take 1 tablet (50 mg total) by mouth every 8 (eight) hours as needed.  Dispense: 90 tablet; Refill: 2  3. Controlled substance agreement signed - traMADol  (ULTRAM ) 50 MG tablet; Take 1 tablet (50 mg total) by mouth every 8 (eight) hours as needed.  Dispense: 90 tablet; Refill: 2  4. Type 2 diabetes mellitus with other specified complication, without long-term current use of insulin (HCC) (Primary) Low carb diet  Continue Ozempic    5. Insomnia, unspecified type Sleep ritual   6. Hyperlipidemia associated with type 2 diabetes mellitus (HCC) Low fat diet   7. Hepatic steatosis Low fat diet   8. GAD (generalized anxiety disorder) Stress management  9. Essential hypertension Continue current medications   10. Major depressive disorder with single episode, in full remission Continue current medications    Labs pending Continue current medications  Patient reviewed in [place] controlled database, no flags noted. Contract and drug screen are up to date.  Health Maintenance reviewed Diet and exercise encouraged  Follow up plan: 3 months    Bari Learn, FNP

## 2024-05-05 NOTE — Patient Instructions (Signed)

## 2024-05-09 ENCOUNTER — Telehealth: Payer: Self-pay

## 2024-05-09 NOTE — Telephone Encounter (Signed)
   Emailed refill to [facility]

## 2024-05-16 NOTE — Telephone Encounter (Signed)
 Faxed Ozempic  2mg  dose pen refills for novo nordisk.

## 2024-06-13 ENCOUNTER — Other Ambulatory Visit: Payer: Self-pay | Admitting: *Deleted

## 2024-06-13 MED ORDER — CELECOXIB 200 MG PO CAPS: 200.0000 mg | ORAL_CAPSULE | Freq: Two times a day (BID) | ORAL | 2 refills | Status: AC

## 2024-06-21 ENCOUNTER — Other Ambulatory Visit: Payer: Self-pay | Admitting: Family

## 2024-06-21 DIAGNOSIS — E1169 Type 2 diabetes mellitus with other specified complication: Secondary | ICD-10-CM

## 2024-08-05 ENCOUNTER — Ambulatory Visit: Payer: Self-pay | Admitting: Family
